# Patient Record
Sex: Female | Born: 1960 | Race: White | Hispanic: Yes | Marital: Married | Smoking: Never smoker
Health system: Southern US, Community
[De-identification: ages and names within clinical notes are randomized; demographics above are authoritative.]

## PROBLEM LIST (undated history)

## (undated) DIAGNOSIS — J309 Allergic rhinitis, unspecified: Secondary | ICD-10-CM

## (undated) DIAGNOSIS — R002 Palpitations: Secondary | ICD-10-CM

## (undated) DIAGNOSIS — M549 Dorsalgia, unspecified: Secondary | ICD-10-CM

## (undated) DIAGNOSIS — K5790 Diverticulosis of intestine, part unspecified, without perforation or abscess without bleeding: Secondary | ICD-10-CM

## (undated) DIAGNOSIS — E66811 Obesity, class 1: Secondary | ICD-10-CM

## (undated) DIAGNOSIS — K760 Fatty (change of) liver, not elsewhere classified: Secondary | ICD-10-CM

## (undated) DIAGNOSIS — G4700 Insomnia, unspecified: Secondary | ICD-10-CM

## (undated) DIAGNOSIS — I10 Essential (primary) hypertension: Secondary | ICD-10-CM

## (undated) DIAGNOSIS — M2550 Pain in unspecified joint: Secondary | ICD-10-CM

## (undated) DIAGNOSIS — Z1211 Encounter for screening for malignant neoplasm of colon: Secondary | ICD-10-CM

## (undated) DIAGNOSIS — M25551 Pain in right hip: Secondary | ICD-10-CM

## (undated) DIAGNOSIS — E7800 Pure hypercholesterolemia, unspecified: Secondary | ICD-10-CM

## (undated) DIAGNOSIS — Z87440 Personal history of urinary (tract) infections: Secondary | ICD-10-CM

## (undated) DIAGNOSIS — E669 Obesity, unspecified: Secondary | ICD-10-CM

## (undated) DIAGNOSIS — K5900 Constipation, unspecified: Secondary | ICD-10-CM

## (undated) DIAGNOSIS — K219 Gastro-esophageal reflux disease without esophagitis: Secondary | ICD-10-CM

## (undated) DIAGNOSIS — Z8719 Personal history of other diseases of the digestive system: Secondary | ICD-10-CM

## (undated) DIAGNOSIS — N12 Tubulo-interstitial nephritis, not specified as acute or chronic: Secondary | ICD-10-CM

## (undated) DIAGNOSIS — D649 Anemia, unspecified: Secondary | ICD-10-CM

## (undated) HISTORY — DX: Tubulo-interstitial nephritis, not specified as acute or chronic: N12

## (undated) HISTORY — DX: Obesity, unspecified: E66.9

## (undated) HISTORY — DX: Personal history of urinary (tract) infections: Z87.440

## (undated) HISTORY — DX: Insomnia, unspecified: G47.00

## (undated) HISTORY — DX: Obesity, class 1: E66.811

## (undated) HISTORY — DX: Dorsalgia, unspecified: M54.9

## (undated) HISTORY — DX: Fatty (change of) liver, not elsewhere classified: K76.0

## (undated) HISTORY — PX: COLONOSCOPY: SHX174

## (undated) HISTORY — DX: Pain in unspecified joint: M25.50

## (undated) HISTORY — DX: Diverticulosis of intestine, part unspecified, without perforation or abscess without bleeding: K57.90

## (undated) HISTORY — PX: ESOPHAGOGASTRODUODENOSCOPY: SHX1529

## (undated) HISTORY — DX: Allergic rhinitis, unspecified: J30.9

## (undated) HISTORY — DX: Gastro-esophageal reflux disease without esophagitis: K21.9

## (undated) HISTORY — DX: Pure hypercholesterolemia, unspecified: E78.00

## (undated) HISTORY — DX: Personal history of other diseases of the digestive system: Z87.19

## (undated) HISTORY — DX: Pain in right hip: M25.551

## (undated) HISTORY — DX: Constipation, unspecified: K59.00

## (undated) HISTORY — DX: Anemia, unspecified: D64.9

## (undated) HISTORY — DX: Encounter for screening for malignant neoplasm of colon: Z12.11

## (undated) HISTORY — DX: Palpitations: R00.2

## (undated) HISTORY — DX: Essential (primary) hypertension: I10

---

## 2003-10-09 HISTORY — PX: ABDOMINAL HYSTERECTOMY: SHX81

## 2003-10-09 HISTORY — PX: BREAST BIOPSY: SHX20

## 2007-10-09 HISTORY — PX: TONSILLECTOMY AND ADENOIDECTOMY: SUR1326

## 2009-10-08 HISTORY — PX: CHOLECYSTECTOMY: SHX55

## 2015-10-09 DIAGNOSIS — R7303 Prediabetes: Secondary | ICD-10-CM

## 2015-10-09 HISTORY — DX: Prediabetes: R73.03

## 2016-01-06 LAB — LIPID PANEL
Cholesterol: 155 (ref 0–200)
HDL: 65 (ref 35–70)
LDL Cholesterol: 77
Triglycerides: 65 (ref 40–160)

## 2016-01-06 LAB — BASIC METABOLIC PANEL
BUN: 15 (ref 4–21)
Creatinine: 1 (ref 0.5–1.1)
Glucose: 109
Potassium: 4.6 (ref 3.4–5.3)
Sodium: 143 (ref 137–147)

## 2016-01-06 LAB — HEMOGLOBIN A1C: Hemoglobin A1C: 6

## 2016-01-06 LAB — HEPATIC FUNCTION PANEL
ALT: 19 (ref 7–35)
AST: 21 (ref 13–35)
Alkaline Phosphatase: 120 (ref 25–125)
Bilirubin, Total: 0.6

## 2016-05-03 LAB — HEPATIC FUNCTION PANEL
ALT: 22 (ref 7–35)
AST: 19 (ref 13–35)
Alkaline Phosphatase: 114 (ref 25–125)
Bilirubin, Total: 0.3

## 2016-05-03 LAB — BASIC METABOLIC PANEL
BUN: 10 (ref 4–21)
Creatinine: 0.8 (ref 0.5–1.1)
Glucose: 107
Potassium: 4.6 (ref 3.4–5.3)
Sodium: 143 (ref 137–147)

## 2016-05-03 LAB — LIPID PANEL
Cholesterol: 167 (ref 0–200)
HDL: 58 (ref 35–70)
LDL Cholesterol: 92
Triglycerides: 84 (ref 40–160)

## 2016-05-03 LAB — HEMOGLOBIN A1C: Hemoglobin A1C: 6.1

## 2016-05-03 LAB — TSH: TSH: 2.07 (ref 0.41–5.90)

## 2016-11-28 LAB — LIPID PANEL
Cholesterol: 221 — AB (ref 0–200)
HDL: 52 (ref 35–70)
LDL Cholesterol: 146
Triglycerides: 113 (ref 40–160)

## 2016-11-28 LAB — HEPATIC FUNCTION PANEL
ALT: 21 (ref 7–35)
AST: 20 (ref 13–35)
Alkaline Phosphatase: 89 (ref 25–125)
Bilirubin, Total: 0.2

## 2016-11-28 LAB — TSH: TSH: 2.49 (ref 0.41–5.90)

## 2016-11-28 LAB — BASIC METABOLIC PANEL
BUN: 14 (ref 4–21)
Creatinine: 1 (ref 0.5–1.1)
Glucose: 106
Potassium: 4.6 (ref 3.4–5.3)
Sodium: 145 (ref 137–147)

## 2016-11-28 LAB — CBC AND DIFFERENTIAL
Platelets: 351 (ref 150–399)
WBC: 9.7

## 2016-11-28 LAB — HEMOGLOBIN A1C: Hemoglobin A1C: 5.8

## 2017-07-08 LAB — HEPATIC FUNCTION PANEL
ALT: 24 (ref 7–35)
AST: 19 (ref 13–35)
Alkaline Phosphatase: 106 (ref 25–125)
Bilirubin, Total: 0.3

## 2017-07-08 LAB — BASIC METABOLIC PANEL
BUN: 9 (ref 4–21)
Creatinine: 0.9 (ref 0.5–1.1)
Glucose: 101
Potassium: 4.7 (ref 3.4–5.3)
Sodium: 145 (ref 137–147)

## 2017-07-08 LAB — HEMOGLOBIN A1C: Hemoglobin A1C: 6.1

## 2017-07-08 LAB — LIPID PANEL
Cholesterol: 205 — AB (ref 0–200)
HDL: 47 (ref 35–70)
LDL Cholesterol: 134
Triglycerides: 121 (ref 40–160)

## 2018-01-03 LAB — LIPID PANEL
Cholesterol: 223 — AB (ref 0–200)
HDL: 65 (ref 35–70)
LDL Cholesterol: 141
Triglycerides: 86 (ref 40–160)

## 2018-01-03 LAB — BASIC METABOLIC PANEL
BUN: 17 (ref 4–21)
Creatinine: 0.8 (ref 0.5–1.1)
Glucose: 91
Potassium: 5.1 (ref 3.4–5.3)
Sodium: 145 (ref 137–147)

## 2018-01-03 LAB — HEPATIC FUNCTION PANEL
ALT: 20 (ref 7–35)
AST: 19 (ref 13–35)
Alkaline Phosphatase: 120 (ref 25–125)
Bilirubin, Total: 0.4

## 2018-01-03 LAB — HEMOGLOBIN A1C: Hemoglobin A1C: 6.1

## 2018-08-11 LAB — BASIC METABOLIC PANEL
BUN: 10 (ref 4–21)
Potassium: 4.8 (ref 3.4–5.3)
Sodium: 148 — AB (ref 137–147)

## 2018-08-11 LAB — HEPATIC FUNCTION PANEL
ALT: 22 (ref 7–35)
AST: 20 (ref 13–35)
Alkaline Phosphatase: 111 (ref 25–125)
Bilirubin, Total: 0.2

## 2018-08-11 LAB — HEMOGLOBIN A1C: Hemoglobin A1C: 5.1

## 2018-08-11 LAB — LIPID PANEL
Cholesterol: 197 (ref 0–200)
HDL: 55 (ref 35–70)
LDL Cholesterol: 121
Triglycerides: 105 (ref 40–160)

## 2018-08-11 LAB — TSH: TSH: 0.64 (ref 0.41–5.90)

## 2018-12-25 ENCOUNTER — Ambulatory Visit: Payer: Self-pay | Admitting: Family Medicine

## 2018-12-29 ENCOUNTER — Ambulatory Visit: Payer: Self-pay | Admitting: Family Medicine

## 2019-01-29 ENCOUNTER — Telehealth: Payer: Self-pay

## 2019-01-29 ENCOUNTER — Encounter: Payer: Self-pay | Admitting: Family Medicine

## 2019-01-29 ENCOUNTER — Other Ambulatory Visit: Payer: Self-pay

## 2019-01-29 ENCOUNTER — Ambulatory Visit (INDEPENDENT_AMBULATORY_CARE_PROVIDER_SITE_OTHER): Payer: 59 | Admitting: Family Medicine

## 2019-01-29 VITALS — Temp 98.5°F | Wt 159.5 lb

## 2019-01-29 DIAGNOSIS — E7800 Pure hypercholesterolemia, unspecified: Secondary | ICD-10-CM

## 2019-01-29 DIAGNOSIS — I10 Essential (primary) hypertension: Secondary | ICD-10-CM | POA: Diagnosis not present

## 2019-01-29 DIAGNOSIS — K219 Gastro-esophageal reflux disease without esophagitis: Secondary | ICD-10-CM

## 2019-01-29 MED ORDER — PANTOPRAZOLE SODIUM 40 MG PO TBEC: 40.0000 mg | DELAYED_RELEASE_TABLET | Freq: Every day | ORAL | 1 refills | Status: DC

## 2019-01-29 NOTE — Telephone Encounter (Signed)
Medical release form faxed again

## 2019-01-29 NOTE — Telephone Encounter (Signed)
Form sent again to Dr.Ford for pt's medical records.  Copied from CRM [id number]. Topic: Medical Record Request - Patient ROI Request >> Jan 29, 2019  2:24 PM Elliot Gault wrote: Relation to pt: self  Call back number: [phone]  Reason for call:  Patient states # indicated on medical release was incorrect, please re fax form to Dr. Ala Dach from [facility] [phone]

## 2019-01-29 NOTE — Progress Notes (Signed)
Virtual Visit via Video Note  I connected with pt  on 01/29/19 at  1:00 PM EDT by a video enabled telemedicine application and verified that I am speaking with the correct person using two identifiers.  Location patient: home Location provider:work or home office Persons participating in the virtual visit: patient, provider  I discussed the limitations of evaluation and management by telemedicine and the availability of in person appointments. The patient expressed understanding and agreed to proceed.  Telemedicine visit is a necessity given the COVID-19 restrictions in place at the current time.       Office Note 01/29/2019  CC:  Chief Complaint  Patient presents with  . Establish Care    Previous PCP, Dr.Ford in [place]   HPI:  Martha [name] is a 58 y.o. female who is being seen today to establish care Patient's most recent primary MD: see above. Old records were not available for review prior to or during today's visit.  Most recent f/u with her PCP in [place] was approx Nov/Dec 2019, at which time she got labs. Says no changes in management at that time.  HTN: she does not monitor her bp outside of medical office. She does plan on getting a bp cuff for home.  GERD: says this is become a problem for her more and more. Used to be an intermittent thing, now has daily sx's for at least a few months. Usually around bedtime she has substernal burning, mild throat discomfort with the feeling of need to clear throat a lot or cough up secretions in throat a lot.  Wakes up in morning with bad taste of refluxed liquid in mouth. Does not eat GERD-friendly diet, nor does she elevate the head of her bed. She has not tried any otc or ever been rx'd any meds for GERD in the past. She reports having had an EGD in the past in the context of abd pain w/u and this just showed some GER changes and was o/w normal. No epigastric pain, no nausea, no undesired wt loss, no use of NSAIDs with any  regularity. No abd pains.  Past Medical History:  Diagnosis Date  . Allergic rhinitis   . Arthralgia of multiple sites    Endo w/u normal per pt.  "treat symptomatically as needed"  . GERD (gastroesophageal reflux disease)   . History of rectal bleeding    remote past->? rectal ulcer vs perf'd divertic-->need old records for clarif.  [name] Kitchen. Hypercholesterolemia    Past hx of statin use, then was able to come off meds when she lost wt  . Hypertension   . Right hip pain    MRI showed labral tear, got steroid injection    Past Surgical History:  Procedure Laterality Date  . ABDOMINAL HYSTERECTOMY  2005   for benign dx (No BSO)  . BREAST BIOPSY  2005  . CESAREAN SECTION     x 2  . CHOLECYSTECTOMY  2011  . COLONOSCOPY     x 2->approx 2011 was done for BRBPR, unclear dx (rectal ulcer? perf'd diverticulum?).  Another about 2015 for abd pain w/u?  No polyps detected on either colonoscopy.  Need old records for clarification (GI MD was Dr[place]  . TONSILLECTOMY AND ADENOIDECTOMY  2009    Family History  Problem Relation Age of Onset  . Diabetes Mother   . Alcohol abuse Mother   . Rheumatic fever Father   . Heart attack Father   . Diabetes Brother  Social History   Socioeconomic History  . Marital status: Married    Spouse name: Not on file  . Number of children: Not on file  . Years of education: Not on file  . Highest education level: Not on file  Occupational History  . Not on file  Social Needs  . Financial resource strain: Not on file  . Food insecurity:    Worry: Not on file    Inability: Not on file  . Transportation needs:    Medical: Not on file    Non-medical: Not on file  Tobacco Use  . Smoking status: Never Smoker  . Smokeless tobacco: Never Used  Substance and Sexual Activity  . Alcohol use: Never    Frequency: Never  . Drug use: Never  . Sexual activity: Not on file  Lifestyle  . Physical activity:    Days per week: Not on file     Minutes per session: Not on file  . Stress: Not on file  Relationships  . Social connections:    Talks on phone: Not on file    Gets together: Not on file    Attends religious service: Not on file    Active member of club or organization: Not on file    Attends meetings of clubs or organizations: Not on file    Relationship status: Not on file  . Intimate partner violence:    Fear of current or ex partner: Not on file    Emotionally abused: Not on file    Physically abused: Not on file    Forced sexual activity: Not on file  Other Topics Concern  . Not on file  Social History Narrative   Married, 2 daughters.   Moved to [facility] from [facility] 2019 to be closer to daughter and grandchildren.   Educ: College in [place].     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   In [place], she works from home for [employer].    Outpatient Encounter Medications as of 01/29/2019  Medication Sig  . Ascorbic Acid (VITAMIN C) 1000 MG tablet Vitamin C 1,000 mg tablet   1 tablet every day by oral route.  . benzonatate (TESSALON) 100 MG capsule benzonatate 100 mg capsule  . cholecalciferol (VITAMIN D) 25 MCG (1000 UT) tablet Vitamin D3 25 mcg (1,000 unit) capsule   1 capsule every day by oral route.  [name] Kitchen levocetirizine (XYZAL) 5 MG tablet levocetirizine 5 mg tablet  TAKE 1 TABLET BY MOUTH EVERY DAY  . lisinopril (ZESTRIL) 5 MG tablet lisinopril 5 mg tablet  . montelukast (SINGULAIR) 10 MG tablet   . Omega-3 Fatty Acids (OMEGA-3 FISH OIL PO) Omega 3 Fish Oil capsule   1 capsule every day by oral route.  . Thyroid (NATURE-THROID) 48.75 MG TABS Nature-Throid 48.75 mg tablet  TAKE ONE TABLET BY MOUTH EVERY MORNING  . pantoprazole (PROTONIX) 40 MG tablet Take 1 tablet (40 mg total) by mouth daily.   No facility-administered encounter medications on file as of 01/29/2019.     Allergies  Allergen Reactions  . Codeine Anaphylaxis    ROS Review of Systems  Constitutional: Negative for  appetite change, chills, fatigue and fever.  HENT: Negative for congestion, dental problem, ear pain and sore throat.   Eyes: Negative for discharge, redness and visual disturbance.  Respiratory: Negative for cough, chest tightness, shortness of breath and wheezing.   Cardiovascular: Negative for chest pain, palpitations and leg swelling.  Gastrointestinal: Negative for abdominal  pain, blood in stool, diarrhea, nausea and vomiting.  Genitourinary: Negative for difficulty urinating, dysuria, flank pain, frequency, hematuria and urgency.  Musculoskeletal: Negative for arthralgias, back pain, joint swelling, myalgias and neck stiffness.  Skin: Negative for pallor and rash.  Neurological: Negative for dizziness, speech difficulty, weakness and headaches.  Hematological: Negative for adenopathy. Does not bruise/bleed easily.  Psychiatric/Behavioral: Negative for confusion and sleep disturbance. The patient is not nervous/anxious.     PE; Temperature 98.5 F (36.9 C), temperature source Oral, weight 159 lb 8 oz (72.3 kg).  GENERAL: alert, oriented, appears well and in no acute distress  HEENT: atraumatic, conjunttiva clear, no obvious abnormalities on inspection of external nose and ears  NECK: normal movements of the head and neck  LUNGS: on inspection no signs of respiratory distress, breathing rate appears normal, no obvious gross SOB, gasping or wheezing  CV: no obvious cyanosis  MS: moves all visible extremities without noticeable abnormality  PSYCH/NEURO: pleasant and cooperative, no obvious depression or anxiety, speech and thought processing grossly intact  Pertinent labs:  NONE TODAY   ASSESSMENT AND PLAN:   1) GERD: daily sx's and affecting QOL significantly. Start pantoprazole 40 mg qd.  Therapeutic expectations and side effect profile of medication discussed today.  Patient's questions answered. GERD diet handout will be mailed to pt. Elevate head of bed with brick or 2  x 4.  2) HTN: well controlled historically per pt report. She will be getting bp cuff to monitor bp at home.  3) HLD: hx of statin use but was able to stop this med when she lost a significant amount of wt. Will review past records.  Will review most recent lab work from her last visit with her PCP. Request prior PCP records again today.  I discussed the assessment and treatment plan with the patient. The patient was provided an opportunity to ask questions and all were answered. The patient agreed with the plan and demonstrated an understanding of the instructions.   The patient was advised to call back or seek an in-person evaluation if the symptoms worsen or if the condition fails to improve as anticipated.  F/u: 1 mo f/u GERD.  Signed:  Santiago Bumpers, MD           01/29/2019

## 2019-01-29 NOTE — Telephone Encounter (Signed)
Copied from CRM [id number]. Topic: Medical Record Request - Patient ROI Request >> Jan 29, 2019  2:24 PM Elliot Gault wrote: Relation to pt: self  Call back number: [phone]  Reason for call:  Patient states # indicated on medical release was incorrect, please re fax form to Dr. Ala Dach from [facility] [phone]

## 2019-01-29 NOTE — Patient Instructions (Signed)
Food Choices for Gastroesophageal Reflux Disease, Adult  When you have gastroesophageal reflux disease (GERD), the foods you eat and your eating habits are very important. Choosing the right foods can help ease the discomfort of GERD. Consider working with a diet and nutrition specialist (dietitian) to help you make healthy food choices.  What general guidelines should I follow?    Eating plan  · Choose healthy foods low in fat, such as fruits, vegetables, whole grains, low-fat dairy products, and lean meat, fish, and poultry.  · Eat frequent, small meals instead of three large meals each day. Eat your meals slowly, in a relaxed setting. Avoid bending over or lying down until 2-3 hours after eating.  · Limit high-fat foods such as fatty meats or fried foods.  · Limit your intake of oils, butter, and shortening to less than 8 teaspoons each day.  · Avoid the following:  ? Foods that cause symptoms. These may be different for different people. Keep a food diary to keep track of foods that cause symptoms.  ? Alcohol.  ? Drinking large amounts of liquid with meals.  ? Eating meals during the 2-3 hours before bed.  · Cook foods using methods other than frying. This may include baking, grilling, or broiling.  Lifestyle  · Maintain a healthy weight. Ask your health care provider what weight is healthy for you. If you need to lose weight, work with your health care provider to do so safely.  · Exercise for at least 30 minutes on 5 or more days each week, or as told by your health care provider.  · Avoid wearing clothes that fit tightly around your waist and chest.  · Do not use any products that contain nicotine or tobacco, such as cigarettes and e-cigarettes. If you need help quitting, ask your health care provider.  · Sleep with the head of your bed raised. Use a wedge under the mattress or blocks under the bed frame to raise the head of the bed.  What foods are not recommended?  The items listed may not be a complete  list. Talk with your dietitian about what dietary choices are best for you.  Grains  Pastries or quick breads with added fat. French toast.  Vegetables  Deep fried vegetables. French fries. Any vegetables prepared with added fat. Any vegetables that cause symptoms. For some people this may include tomatoes and tomato products, chili peppers, onions and garlic, and horseradish.  Fruits  Any fruits prepared with added fat. Any fruits that cause symptoms. For some people this may include citrus fruits, such as oranges, grapefruit, pineapple, and lemons.  Meats and other protein foods  High-fat meats, such as fatty beef or pork, hot dogs, ribs, ham, sausage, salami and bacon. Fried meat or protein, including fried fish and fried chicken. Nuts and nut butters.  Dairy  Whole milk and chocolate milk. Sour cream. Cream. Ice cream. Cream cheese. Milk shakes.  Beverages  Coffee and tea, with or without caffeine. Carbonated beverages. Sodas. Energy drinks. Fruit juice made with acidic fruits (such as orange or grapefruit). Tomato juice. Alcoholic drinks.  Fats and oils  Butter. Margarine. Shortening. Ghee.  Sweets and desserts  Chocolate and cocoa. Donuts.  Seasoning and other foods  Pepper. Peppermint and spearmint. Any condiments, herbs, or seasonings that cause symptoms. For some people, this may include curry, hot sauce, or vinegar-based salad dressings.  Summary  · When you have gastroesophageal reflux disease (GERD), food and lifestyle choices are very   important to help ease the discomfort of GERD.  · Eat frequent, small meals instead of three large meals each day. Eat your meals slowly, in a relaxed setting. Avoid bending over or lying down until 2-3 hours after eating.  · Limit high-fat foods such as fatty meat or fried foods.  This information is not intended to replace advice given to you by your health care provider. Make sure you discuss any questions you have with your health care provider.  Document Released:  09/24/2005 Document Revised: 09/25/2016 Document Reviewed: 09/25/2016  Elsevier Interactive Patient Education © 2019 Elsevier Inc.

## 2019-01-29 NOTE — Telephone Encounter (Signed)
New medical release form faxed to # listed below.  Copied from CRM [id number]. Topic: Medical Record Request - Patient ROI Request >> Jan 29, 2019  2:24 PM Elliot Gault wrote: Relation to pt: self  Call back number: [phone]  Reason for call:  Patient states # indicated on medical release was incorrect, please re fax form to Dr. Ala Dach from [facility] [phone]

## 2019-01-30 ENCOUNTER — Telehealth: Payer: Self-pay

## 2019-01-30 NOTE — Telephone Encounter (Signed)
PW placed up front to be mailed.

## 2019-02-09 ENCOUNTER — Encounter: Payer: Self-pay | Admitting: Family Medicine

## 2019-02-09 NOTE — Progress Notes (Signed)
11/28/2016

## 2019-02-20 ENCOUNTER — Other Ambulatory Visit: Payer: Self-pay | Admitting: Family Medicine

## 2019-02-20 NOTE — Telephone Encounter (Signed)
Pt has upcoming appt on 5/18 for f/u GERD and was given (30,1) on 01/29/19. Will hold off until then

## 2019-02-23 ENCOUNTER — Ambulatory Visit (INDEPENDENT_AMBULATORY_CARE_PROVIDER_SITE_OTHER): Payer: 59 | Admitting: Family Medicine

## 2019-02-23 ENCOUNTER — Encounter: Payer: Self-pay | Admitting: Family Medicine

## 2019-02-23 VITALS — BP 120/78 | Temp 98.1°F | Wt 161.0 lb

## 2019-02-23 DIAGNOSIS — R7303 Prediabetes: Secondary | ICD-10-CM

## 2019-02-23 DIAGNOSIS — K219 Gastro-esophageal reflux disease without esophagitis: Secondary | ICD-10-CM | POA: Diagnosis not present

## 2019-02-23 MED ORDER — FAMOTIDINE 40 MG PO TABS: 40.0000 mg | ORAL_TABLET | Freq: Every day | ORAL | 1 refills | Status: DC

## 2019-02-23 NOTE — Progress Notes (Signed)
Virtual Visit via Video Note  I connected with pt on 02/23/19 at  3:00 PM EDT by a video enabled telemedicine application and verified that I am speaking with the correct person using two identifiers.  Location patient: home Location provider:work or home office Persons participating in the virtual visit: patient, provider  I discussed the limitations of evaluation and management by telemedicine and the availability of in person appointments. The patient expressed understanding and agreed to proceed.   HPI: 58 y/o WF being seen today for 3 week f/u for GERD. Started her on pantoprazole 40mg  qd last visit.  Interim hx:  Improved but still waking up in middle of night with sour brash in mouth.  No longer with sense of regurgitation.  No dysphagia.  Appetite is good. Dietary modifications--->not eating later than 6:30 pm.   She takes the pantoprazole qAM.  Since last visit I have received her prior PCP records and review of these showed hx of prediabetes (see pMH section below).  She was put on metformin once a day by a wt loss MD, approx 6-12 mo, says her PCP then took her off the med.  She recalls tolerating the med w/out any problem.  ROS: no chest pain, no cough, no ST, no SOB, no wheezing, no hoarseness, no fevers, no abd pain.  Past Medical History:  Diagnosis Date  . Allergic rhinitis   . Arthralgia of multiple sites    Gen rheum lab w/u normal/neg 11/2016 by prior PCP  . GERD (gastroesophageal reflux disease)   . History of rectal bleeding    remote past->? rectal ulcer vs perf'd divertic-->need old records for clarif.  [name] Kitchen. Hypercholesterolemia    Past hx of statin use, then was able to come off meds when she lost wt  . Hypertension   . Insomnia   . Prediabetes 2017   Old PCP records state only that her Hba1c was 6.1% in 2017 and that she took metformin for " a while". A1c 6.1% Nov 2019.  . Right hip pain    MRI showed labral tear, got steroid injection    Past Surgical  History:  Procedure Laterality Date  . ABDOMINAL HYSTERECTOMY  2005   for benign dx (No BSO)  . BREAST BIOPSY  2005  . CESAREAN SECTION     x 2  . CHOLECYSTECTOMY  2011  . COLONOSCOPY  most recent 01/2012   x 2->approx 2011 was done for BRBPR, unclear dx (rectal ulcer? perf'd diverticulum?).  Another about 2015 for abd pain w/u?  No polyps detected on either colonoscopy.  Need old records for clarification (GI MD was Dr[place] The[place]ore [place] in [place]prior pcp records say "colonoscopy 01/2012 negative".  . ESOPHAGOGASTRODUODENOSCOPY     done as part of abd pain w/u: showed GER but o/w was normal.  . TONSILLECTOMY AND ADENOIDECTOMY  2009    Family History  Problem Relation Age of Onset  . Diabetes Mother   . Alcohol abuse Mother   . Rheumatic fever Father   . Heart attack Father   . Diabetes Brother     SOCIAL HX: Married, 2 daughters, works for [employer] out of her home.   Current Outpatient Medications:  .  Ascorbic Acid (VITAMIN C) 1000 MG tablet, Vitamin C 1,000 mg tablet   1 tablet every day by oral route., Disp: , Rfl:  .  cholecalciferol (VITAMIN D) 25 MCG (1000 UT) tablet, Vitamin D3 25 mcg (1,000 unit) capsule   1 capsule every day by oral  route., Disp: , Rfl:  .  levocetirizine (XYZAL) 5 MG tablet, levocetirizine 5 mg tablet  TAKE 1 TABLET BY MOUTH EVERY DAY, Disp: , Rfl:  .  lisinopril (ZESTRIL) 5 MG tablet, lisinopril 5 mg tablet, Disp: , Rfl:  .  meloxicam (MOBIC) 15 MG tablet, Take 15 mg by mouth daily as needed for pain., Disp: , Rfl:  .  montelukast (SINGULAIR) 10 MG tablet, , Disp: , Rfl:  .  Omega-3 Fatty Acids (OMEGA-3 FISH OIL PO), Omega 3 Fish Oil capsule   1 capsule every day by oral route., Disp: , Rfl:  .  pantoprazole (PROTONIX) 40 MG tablet, Take 1 tablet (40 mg total) by mouth daily., Disp: 30 tablet, Rfl: 1 .  Thyroid (NATURE-THROID) 48.75 MG TABS, Nature-Throid 48.75 mg tablet  TAKE ONE TABLET BY MOUTH EVERY MORNING, Disp: , Rfl:  .  benzonatate (TESSALON) 100 MG  capsule, benzonatate 100 mg capsule, Disp: , Rfl:   EXAM:  VITALS per patient if applicable: BP 120/78 (BP Location: Left Arm, Patient Position: Sitting, Cuff Size: Normal)   Temp 98.1 F (36.7 C) (Oral)   Wt 161 lb (73 kg)    GENERAL: alert, oriented, appears well and in no acute distress  HEENT: atraumatic, conjunttiva clear, no obvious abnormalities on inspection of external nose and ears  NECK: normal movements of the head and neck  LUNGS: on inspection no signs of respiratory distress, breathing rate appears normal, no obvious gross SOB, gasping or wheezing  CV: no obvious cyanosis  MS: moves all visible extremities without noticeable abnormality  PSYCH/NEURO: pleasant and cooperative, no obvious depression or anxiety, speech and thought processing grossly intact  LABS: none today    Chemistry      Component Value Date/Time   NA 148 (A) 08/11/2018   K 4.8 08/11/2018   BUN 10 08/11/2018   CREATININE 0.8 01/03/2018   GLU 91 01/03/2018      Component Value Date/Time   ALKPHOS 111 08/11/2018   AST 20 08/11/2018   ALT 22 08/11/2018     Lab Results  Component Value Date   TSH 0.64 08/11/2018   Lab Results  Component Value Date   CHOL 197 08/11/2018   HDL 55 08/11/2018   LDLCALC 121 08/11/2018   TRIG 105 08/11/2018   Lab Results  Component Value Date   WBC 9.7 11/28/2016   PLT 351 11/28/2016   Lab Results  Component Value Date   HGBA1C 5.1 08/11/2018    ASSESSMENT AND PLAN:  Discussed the following assessment and plan:   1) GERD: improved but still some breakthrough sx's hs/middle of night. Continue pantoprazole 40mg  qAM, adjust diet, and add pepcid 40mg  qhs.  2) Prediabetes: we'll hold off from restarting metformin today since her last 2 A1c's have been stable. If A1c rises any at next CPE/labs in 6 mo then will restart this med.   I discussed the assessment and treatment plan with the patient. The patient was provided an opportunity to ask  questions and all were answered. The patient agreed with the plan and demonstrated an understanding of the instructions.   The patient was advised to call back or seek an in-person evaluation if the symptoms worsen or if the condition fails to improve as anticipated.  F/u: 6 wks f/u GERD. CPE November 2020  Signed:  Santiago Bumpers, MD           02/23/2019

## 2019-04-01 ENCOUNTER — Other Ambulatory Visit: Payer: Self-pay

## 2019-04-01 ENCOUNTER — Ambulatory Visit (INDEPENDENT_AMBULATORY_CARE_PROVIDER_SITE_OTHER): Payer: 59 | Admitting: Family Medicine

## 2019-04-01 ENCOUNTER — Encounter: Payer: Self-pay | Admitting: Family Medicine

## 2019-04-01 VITALS — BP 120/85 | Temp 98.6°F | Wt 161.0 lb

## 2019-04-01 DIAGNOSIS — J309 Allergic rhinitis, unspecified: Secondary | ICD-10-CM | POA: Diagnosis not present

## 2019-04-01 MED ORDER — FLUTICASONE PROPIONATE 50 MCG/ACT NA SUSP: 2.0000 | Freq: Every day | NASAL | 6 refills | Status: DC

## 2019-04-01 MED ORDER — PREDNISONE 5 MG PO TABS: ORAL_TABLET | ORAL | 0 refills | Status: DC

## 2019-04-01 NOTE — Progress Notes (Signed)
Virtual Visit via Video Note  I connected with pt on 04/01/19 at  4:00 PM EDT by a video enabled telemedicine application and verified that I am speaking with the correct person using two identifiers.  Location patient: home Location provider:work or home office Persons participating in the virtual visit: patient, provider  I discussed the limitations of evaluation and management by telemedicine and the availability of in person appointments. The patient expressed understanding and agreed to proceed.  Telemedicine visit is a necessity given the COVID-19 restrictions in place at the current time.  HPI: 58 y/o female being seen today for "allergies/sinus". Onset of PND and sinus pressure yesterday, hoarse voice today, some greenish mucous coming from nose. Occ nonproductive PND cough, no fever.  Some dry and mildly ST. Her allergies are usually worse this time of year.  ROS: See pertinent positives and negatives per HPI.  Past Medical History:  Diagnosis Date  . Allergic rhinitis   . Arthralgia of multiple sites    Gen rheum lab w/u normal/neg 11/2016 by prior PCP  . GERD (gastroesophageal reflux disease)   . History of rectal bleeding    remote past->? rectal ulcer vs perf'd divertic-->need old records for clarif.  [name] Kitchen. Hypercholesterolemia    Past hx of statin use, then was able to come off meds when she lost wt  . Hypertension   . Insomnia   . Prediabetes 2017   Old PCP records state only that her Hba1c was 6.1% in 2017: she took metformin x ? 6-12 mo, rx'd by wt loss MD.  A1c 6.1% Nov 2019.  . Right hip pain    MRI showed labral tear, got steroid injection    Past Surgical History:  Procedure Laterality Date  . ABDOMINAL HYSTERECTOMY  2005   for benign dx (No BSO)  . BREAST BIOPSY  2005  . CESAREAN SECTION     x 2  . CHOLECYSTECTOMY  2011  . COLONOSCOPY  most recent 01/2012   x 2->approx 2011 was done for BRBPR, unclear dx (rectal ulcer? perf'd diverticulum?).  Another  about 2015 for abd pain w/u?  No polyps detected on either colonoscopy.  Need old records for clarification (GI MD was Dr. Thedore [place] in [place])--prior pcp records say "colonoscopy 01/2012 negative".  . ESOPHAGOGASTRODUODENOSCOPY     done as part of abd pain w/u: showed GER but o/w was normal.  . TONSILLECTOMY AND ADENOIDECTOMY  2009    Family History  Problem Relation Age of Onset  . Diabetes Mother   . Alcohol abuse Mother   . Rheumatic fever Father   . Heart attack Father   . Diabetes Brother     SOCIAL HX:  Social History   Socioeconomic History  . Marital status: Married    Spouse name: Not on file  . Number of children: Not on file  . Years of education: Not on file  . Highest education level: Not on file  Occupational History  . Not on file  Social Needs  . Financial resource strain: Not on file  . Food insecurity    Worry: Not on file    Inability: Not on file  . Transportation needs    Medical: Not on file    Non-medical: Not on file  Tobacco Use  . Smoking status: Never Smoker  . Smokeless tobacco: Never Used  Substance and Sexual Activity  . Alcohol use: Never    Frequency: Never  . Drug use: Never  . Sexual activity: Not on  file  Lifestyle  . Physical activity    Days per week: Not on file    Minutes per session: Not on file  . Stress: Not on file  Relationships  . Social Herbalist on phone: Not on file    Gets together: Not on file    Attends religious service: Not on file    Active member of club or organization: Not on file    Attends meetings of clubs or organizations: Not on file    Relationship status: Not on file  Other Topics Concern  . Not on file  Social History Narrative   Married, 2 daughters.   Moved to [facility] from [facility] 2019 to be closer to daughter and grandchildren.   Educ: College in [place].     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   In [place], she works from home for [employer].      Current  Outpatient Medications:  .  Ascorbic Acid (VITAMIN C) 1000 MG tablet, Vitamin C 1,000 mg tablet   1 tablet every day by oral route., Disp: , Rfl:  .  cholecalciferol (VITAMIN D) 25 MCG (1000 UT) tablet, Vitamin D3 25 mcg (1,000 unit) capsule   1 capsule every day by oral route., Disp: , Rfl:  .  famotidine (PEPCID) 40 MG tablet, Take 1 tablet (40 mg total) by mouth at bedtime., Disp: 30 tablet, Rfl: 1 .  levocetirizine (XYZAL) 5 MG tablet, levocetirizine 5 mg tablet  TAKE 1 TABLET BY MOUTH EVERY DAY, Disp: , Rfl:  .  lisinopril (ZESTRIL) 5 MG tablet, lisinopril 5 mg tablet, Disp: , Rfl:  .  meloxicam (MOBIC) 15 MG tablet, Take 15 mg by mouth daily as needed for pain., Disp: , Rfl:  .  montelukast (SINGULAIR) 10 MG tablet, , Disp: , Rfl:  .  Omega-3 Fatty Acids (OMEGA-3 FISH OIL PO), Omega 3 Fish Oil capsule   1 capsule every day by oral route., Disp: , Rfl:  .  pantoprazole (PROTONIX) 40 MG tablet, TAKE 1 TABLET BY MOUTH EVERY DAY, Disp: 30 tablet, Rfl: 1 .  Thyroid (NATURE-THROID) 48.75 MG TABS, Nature-Throid 48.75 mg tablet  TAKE ONE TABLET BY MOUTH EVERY MORNING, Disp: , Rfl:  .  benzonatate (TESSALON) 100 MG capsule, benzonatate 100 mg capsule, Disp: , Rfl:  .  fluticasone (FLONASE) 50 MCG/ACT nasal spray, Place 2 sprays into both nostrils daily., Disp: 16 g, Rfl: 6 .  predniSONE (DELTASONE) 5 MG tablet, 4 tabs po qd x 2d, then 2 tabs po qd x 2d, then 1 tab po qd x 2d, Disp: 14 tablet, Rfl: 0  EXAM:  VITALS per patient if applicable:  GENERAL: alert, oriented, appears well and in no acute distress  HEENT: atraumatic, conjunttiva clear, no obvious abnormalities on inspection of external nose and ears  NECK: normal movements of the head and neck  LUNGS: on inspection no signs of respiratory distress, breathing rate appears normal, no obvious gross SOB, gasping or wheezing  CV: no obvious cyanosis  MS: moves all visible extremities without noticeable abnormality  PSYCH/NEURO: pleasant  and cooperative, no obvious depression or anxiety, speech and thought processing grossly intact  LABS: none today    Chemistry      Component Value Date/Time   NA 148 (A) 08/11/2018   K 4.8 08/11/2018   BUN 10 08/11/2018   CREATININE 0.8 01/03/2018   GLU 91 01/03/2018      Component Value Date/Time  ALKPHOS 111 08/11/2018   AST 20 08/11/2018   ALT 22 08/11/2018     Lab Results  Component Value Date   WBC 9.7 11/28/2016   PLT 351 11/28/2016   Lab Results  Component Value Date   TSH 0.64 08/11/2018    ASSESSMENT AND PLAN:  Discussed the following assessment and plan:  1) Allergic rhinitis.  Less likely viral URI.  VERY low suspicion of bacterial sinusitis. Plan: start daily flonase and take regularly until weather is cooler and her allergies naturally improve.   Prednisone 20mg  qd x 2d, then 10mg  qd x 2d, then 5mg  qd x 2d. Continue xyzal and singulair.  I discussed the assessment and treatment plan with the patient. The patient was provided an opportunity to ask questions and all were answered. The patient agreed with the plan and demonstrated an understanding of the instructions.   The patient was advised to call back or seek an in-person evaluation if the symptoms worsen or if the condition fails to improve as anticipated.  F/u: prn  Signed:  Santiago BumpersPhil Yatzari Jonsson, MD           04/01/2019

## 2019-04-06 ENCOUNTER — Ambulatory Visit (INDEPENDENT_AMBULATORY_CARE_PROVIDER_SITE_OTHER): Payer: 59 | Admitting: Family Medicine

## 2019-04-06 ENCOUNTER — Encounter: Payer: Self-pay | Admitting: Family Medicine

## 2019-04-06 ENCOUNTER — Other Ambulatory Visit: Payer: Self-pay

## 2019-04-06 VITALS — BP 128/79 | Temp 98.7°F | Wt 160.0 lb

## 2019-04-06 DIAGNOSIS — J309 Allergic rhinitis, unspecified: Secondary | ICD-10-CM

## 2019-04-06 DIAGNOSIS — K219 Gastro-esophageal reflux disease without esophagitis: Secondary | ICD-10-CM

## 2019-04-06 NOTE — Progress Notes (Signed)
OFFICE VISIT  04/06/2019   CC:  Chief Complaint  Patient presents with  . Follow-up    GERD   I connected with Martha [name] (09/20/1961) on 04/06/19 at 1:45pm by a video enabled telemedicine application and verified that I am speaking with the correct person using two identifiers.  Location patient: home Location provider:work or home office Persons participating in the virtual visit: patient, provider  I discussed the limitations of evaluation and management by telemedicine and the availability of in person appointments. The patient expressed understanding and agreed to proceed.  HPI:    Patient is a 58 y.o. Caucasian female who presents for 6 wk f/u GERD. Last visit she was improved signif on pantoprazole 40mg  qAM but I went ahead and added pepcid 40mg  qhs to this b/c still waking up in night ewith sour brash in mouth.  Additionally, I saw her 5 d/a for a "flare" of allergic rhinitis and rx'd a 6d prednisone taper and also flonase to take daily.  Continued her on xyzal and singulair qd.  Interim Hx: Says she is doing VERY WELL and has no GERD sx's at all now. Says her allergic rhintis/PND/raspy throat sx's persist but are slightly improved.  No cough or fevers or wheezing.  Past Medical History:  Diagnosis Date  . Allergic rhinitis   . Arthralgia of multiple sites    Gen rheum lab w/u normal/neg 11/2016 by prior PCP  . GERD (gastroesophageal reflux disease)   . History of rectal bleeding    remote past->? rectal ulcer vs perf'd divertic-->need old records for clarif.  [name] Kitchen Hypercholesterolemia    Past hx of statin use, then was able to come off meds when she lost wt  . Hypertension   . Insomnia   . Prediabetes 2017   Old PCP records state only that her Hba1c was 6.1% in 2017: she took metformin x ? 6-12 mo, rx'd by wt loss MD.  A1c 6.1% Nov 2019.  . Right hip pain    MRI showed labral tear, got steroid injection    Past Surgical History:  Procedure Laterality Date  .  ABDOMINAL HYSTERECTOMY  2005   for benign dx (No BSO)  . BREAST BIOPSY  2005  . CESAREAN SECTION     x 2  . CHOLECYSTECTOMY  2011  . COLONOSCOPY  most recent 01/2012   x 2->approx 2011 was done for BRBPR, unclear dx (rectal ulcer? perf'd diverticulum?).  Another about 2015 for abd pain w/u?  No polyps detected on either colonoscopy.  Need old records for clarification (GI [place] was Dr. Candiss [place] in [place]prior pcp records say "colonoscopy 01/2012 negative".  . ESOPHAGOGASTRODUODENOSCOPY     done as part of abd pain w/u: showed GER but o/w was normal.  . TONSILLECTOMY AND ADENOIDECTOMY  2009    Outpatient Medications Prior to Visit  Medication Sig Dispense Refill  . Ascorbic Acid (VITAMIN C) 1000 MG tablet Vitamin C 1,000 mg tablet   1 tablet every day by oral route.    . cholecalciferol (VITAMIN D) 25 MCG (1000 UT) tablet Vitamin D3 25 mcg (1,000 unit) capsule   1 capsule every day by oral route.    . famotidine (PEPCID) 40 MG tablet Take 1 tablet (40 mg total) by mouth at bedtime. 30 tablet 1  . fluticasone (FLONASE) 50 MCG/ACT nasal spray Place 2 sprays into both nostrils daily. 16 g 6  . levocetirizine (XYZAL) 5 MG tablet levocetirizine 5 mg tablet  TAKE 1 TABLET BY MOUTH  EVERY DAY    . lisinopril (ZESTRIL) 5 MG tablet lisinopril 5 mg tablet    . meloxicam (MOBIC) 15 MG tablet Take 15 mg by mouth daily as needed for pain.    . montelukast (SINGULAIR) 10 MG tablet     . Omega-3 Fatty Acids (OMEGA-3 FISH OIL PO) Omega 3 Fish Oil capsule   1 capsule every day by oral route.    . pantoprazole (PROTONIX) 40 MG tablet TAKE 1 TABLET BY MOUTH EVERY DAY 30 tablet 1  . predniSONE (DELTASONE) 5 MG tablet 4 tabs po qd x 2d, then 2 tabs po qd x 2d, then 1 tab po qd x 2d 14 tablet 0  . Thyroid (NATURE-THROID) 48.75 MG TABS Nature-Throid 48.75 mg tablet  TAKE ONE TABLET BY MOUTH EVERY MORNING    . benzonatate (TESSALON) 100 MG capsule benzonatate 100 mg capsule     No facility-administered medications  prior to visit.     Allergies  Allergen Reactions  . Codeine Anaphylaxis    ROS As per HPI  PE: Blood pressure 128/79, temperature 98.7 F (37.1 C), temperature source Oral, weight 160 lb (72.6 kg). Gen: Alert, well appearing.  Patient is oriented to person, place, time, and situation.  ENT: conjunctiva appear w/out redness or drainage.    NECK: normal movements of the head and neck  LUNGS: on inspection no signs of respiratory distress, breathing rate appears normal, no obvious gross SOB, gasping or wheezing  CV: no obvious cyanosis  MS: moves all visible extremities without noticeable abnormality  PSYCH/NEURO: pleasant and cooperative, no obvious depression or anxiety, speech and thought processing grossly intact    LABS:    Chemistry      Component Value Date/Time   NA 148 (A) 08/11/2018   K 4.8 08/11/2018   BUN 10 08/11/2018   CREATININE 0.8 01/03/2018   GLU 91 01/03/2018      Component Value Date/Time   ALKPHOS 111 08/11/2018   AST 20 08/11/2018   ALT 22 08/11/2018     Lab Results  Component Value Date   HGBA1C 5.1 08/11/2018   Lab Results  Component Value Date   WBC 9.7 11/28/2016   PLT 351 11/28/2016   Lab Results  Component Value Date   TSH 0.64 08/11/2018     IMPRESSION AND PLAN:  1) GERD: doing great. Continue pantoprazole 40 mg qAM and pepcid 40mg  qhs. Discussed various ways to ween down/off these meds if she desires to in the future.  2) Allergic rhinitis: improving some.  No changes today.  An After Visit Summary was printed and given to the patient.  FOLLOW UP: Return in about 6 months (around 10/06/2019) for annual CPE (fasting).  Signed:  Santiago BumpersPhil Shalini Mair, MD           04/06/2019

## 2019-04-17 ENCOUNTER — Other Ambulatory Visit: Payer: Self-pay | Admitting: Family Medicine

## 2019-04-21 ENCOUNTER — Other Ambulatory Visit: Payer: Self-pay | Admitting: Family Medicine

## 2019-05-11 ENCOUNTER — Other Ambulatory Visit: Payer: Self-pay | Admitting: Family Medicine

## 2019-05-14 ENCOUNTER — Other Ambulatory Visit: Payer: Self-pay | Admitting: Family Medicine

## 2019-06-09 HISTORY — PX: ESOPHAGOGASTRODUODENOSCOPY: SHX1529

## 2019-06-17 ENCOUNTER — Encounter: Payer: Self-pay | Admitting: Family Medicine

## 2019-06-17 ENCOUNTER — Encounter: Payer: Self-pay | Admitting: Gastroenterology

## 2019-06-17 MED ORDER — PANTOPRAZOLE SODIUM 40 MG PO TBEC: 40.0000 mg | DELAYED_RELEASE_TABLET | Freq: Two times a day (BID) | ORAL | 6 refills | Status: DC

## 2019-06-17 NOTE — Telephone Encounter (Signed)
No visit with me is needed at this time. Stop pepcid. Take pantoprazole 40mg  every morning before BF and every evening before supper. Make sure you have elevated the head of your bed with a brick or 2 x  4. Don't eat within 2 hours of lying down to go to bed.  Avoid overeating and avoid spicy foods, fatty foods, citrus drinks, alcohol, high fat dairy, coffee, spearmint, peppermint, and chocolate.

## 2019-06-25 ENCOUNTER — Encounter: Payer: Self-pay | Admitting: Gastroenterology

## 2019-06-25 ENCOUNTER — Ambulatory Visit (INDEPENDENT_AMBULATORY_CARE_PROVIDER_SITE_OTHER): Payer: 59 | Admitting: Gastroenterology

## 2019-06-25 VITALS — BP 162/84 | HR 84 | Temp 98.7°F | Ht 58.75 in | Wt 170.0 lb

## 2019-06-25 DIAGNOSIS — K219 Gastro-esophageal reflux disease without esophagitis: Secondary | ICD-10-CM

## 2019-06-25 NOTE — Progress Notes (Signed)
Referring Provider: Tammi Sou, MD Primary Care Physician:  Tammi Sou, MD  Reason for Consultation: Reflux   IMPRESSION:  Reflux not responding to pantoprazole twice daily Recent unintentional weight gain of 30 pounds Prior colonoscopy for rectal bleeding 5 years ago in [place]  I have recommended an EGD with esophageal and gastric biopsies given the differential of reflux esophagitis, persistent H pylori, PUD, gastritis, non-erosive reflux disease, esophageal ulcer, and even malignancy.  I suspect her recent exacerbation is related to her unintentional weight gain.  Working to achieve a healthy weight should be a high priority.  Plan PPI BID and add an evening H2 blocker.   PLAN: Reviewed lifestyle modification, brochure provided, reviewed recommendations for wedge pillow versus raising the head of the bed instead of sleeping on pillows Continue pantoprazole 40 mg twice daily and famotidine 20 mg QHS EGD Obtain prior endoscopy records from [facility], [place]  Please see the "Patient Instructions" section for addition details about the plan.  HPI: Martha [name] is a 58 y.o. female referred by Dr. Ernestine Conrad for reflux.  The history is obtained to the patient and review of her electronic health record.  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  She is a longstanding history of reflux that developed after her cholecystectomy.  Symptoms are daily with frequent nocturnal regurgitation and brash.Awakes with a sore throat each morning.  Having more frequent breakthrough symptoms despite   No cough, choking, dysphonia, dyspepsia, nausea, abdominal pain.  There is no change in her bowel habits.  No evidence for GI bleeding, iron deficiency anemia, anorexia, unexplained weight loss, dysphagia, odynophagia, persistent vomiting, or gastrointestinal cancer in a first-degree relative.  In fact,  she is had an unintended 30 pound weight gain since she has been working at home and has been unable to do Zumba.  Despite a switch by Dr. Ernestine Conrad to pantoprazole 40 mg twice daily she is continued to have ongoing symptoms.  Will use Pepto-Bismol for breakthrough symptoms resulting in black stools.  She is tried sleeping on 2 pillows with no change in her symptoms.  Labs 08/11/2018: Sodium 148, potassium 4.8, AST 20, ALT 22, total bilirubin 0.2, alk phos 111, hemoglobin A1c 5.1, TSH 0.64  Rectal bleeding at the time of colonoscopy 5 years ago.  No personal history of colon polyps.  No other ongoing GI symptoms.  No known family history of colon cancer or polyps. No family history of uterine/endometrial cancer, pancreatic cancer or gastric/stomach cancer.   Past Medical History:  Diagnosis Date  . Allergic rhinitis   . Arthralgia of multiple sites    Gen rheum lab w/u normal/neg 11/2016 by prior PCP  . GERD (gastroesophageal reflux disease)   . History of rectal bleeding    remote past->? rectal ulcer vs perf'd divertic-->need old records for clarif.  [name] Kitchen Hypercholesterolemia    Past hx of statin use, then was able to come off meds when she lost wt  . Hypertension   . Insomnia   . Prediabetes 2017   Old PCP records state only that her Hba1c was 6.1% in 2017: she took metformin x ? 6-12 mo, rx'd by wt loss MD.  A1c 6.1% Nov 2019.  . Right hip pain    MRI showed labral tear, got steroid injection    Past Surgical History:  Procedure Laterality Date  . ABDOMINAL HYSTERECTOMY  2005   for benign  dx (No BSO)  . BREAST BIOPSY  2005  . CESAREAN SECTION     x 2  . CHOLECYSTECTOMY  2011  . COLONOSCOPY  most recent 01/2012   x 2->approx 2011 was done for BRBPR, unclear dx (rectal ulcer? perf'd diverticulum?).  Another about 2015 for abd pain w/u?  No polyps detected on either colonoscopy.  Need old records for clarification (GI MD was Dr. Candiss [place] in [place])--prior pcp records say "colonoscopy  01/2012 negative".  . ESOPHAGOGASTRODUODENOSCOPY     done as part of abd pain w/u: showed GER but o/w was normal.  . TONSILLECTOMY AND ADENOIDECTOMY  2009    Allergies as of 06/25/2019 - Review Complete 06/25/2019  Allergen Reaction Noted  . Codeine Anaphylaxis 01/29/2019    Family History  Problem Relation Age of Onset  . Diabetes Mother   . Alcohol abuse Mother   . Rheumatic fever Father   . Heart attack Father   . Diabetes Brother     Social History   Socioeconomic History  . Marital status: Married    Spouse name: Not on file  . Number of children: Not on file  . Years of education: Not on file  . Highest education level: Not on file  Occupational History  . Not on file  Social Needs  . Financial resource strain: Not on file  . Food insecurity    Worry: Not on file    Inability: Not on file  . Transportation needs    Medical: Not on file    Non-medical: Not on file  Tobacco Use  . Smoking status: Never Smoker  . Smokeless tobacco: Never Used  Substance and Sexual Activity  . Alcohol use: Never    Frequency: Never  . Drug use: Never  . Sexual activity: Not on file  Lifestyle  . Physical activity    Days per week: Not on file    Minutes per session: Not on file  . Stress: Not on file  Relationships  . Social Herbalist on phone: Not on file    Gets together: Not on file    Attends religious service: Not on file    Active member of club or organization: Not on file    Attends meetings of clubs or organizations: Not on file    Relationship status: Not on file  . Intimate partner violence    Fear of current or ex partner: Not on file    Emotionally abused: Not on file    Physically abused: Not on file    Forced sexual activity: Not on file  Other Topics Concern  . Not on file  Social History Narrative   Married, 2 daughters.   Moved to [place] from [facility] 2019 to be closer to daughter and grandchildren.   Educ: College in [place].     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   In [place], she works from home for [employer].    Review of Systems: 12 system ROS is negative except as noted above with the additions of allergies, anxiety, cough, fatigue, muscle pains and cramps, insomnia.[name] Kitchen   Physical Exam: General:   Alert,  well-nourished, pleasant and cooperative in NAD Head:  Normocephalic and atraumatic. Eyes:  Sclera clear, no icterus.   Conjunctiva pink. Ears:  Normal auditory acuity. Nose:  No deformity, discharge,  or lesions. Mouth:  No deformity or lesions.   Neck:  Supple; no masses or thyromegaly. Lungs:  Clear  throughout to auscultation.   No wheezes. Heart:  Regular rate and rhythm; no murmurs. Abdomen:  Soft,nontender, nondistended, normal bowel sounds, no rebound or guarding. No hepatosplenomegaly.   Rectal:  Deferred  Msk:  Symmetrical. No boney deformities LAD: No inguinal or umbilical LAD Extremities:  No clubbing or edema. Neurologic:  Alert and  oriented x4;  grossly nonfocal Skin:  Intact without significant lesions or rashes. Psych:  Alert and cooperative. Normal mood and affect.     Martha Macinnes L. Tarri Glenn, MD, MPH 06/25/2019, 1:43 PM

## 2019-06-25 NOTE — Patient Instructions (Addendum)
Continue pantoprazole 40 mg twice daily. Add famotidine 20 mg every night.  Please take your pantoprazole 30 minutes before meals Avoid any dietary triggers Avoid spicy and acidic foods Limit your intake of coffee, tea, alcohol, and carbonated drinks Work to maintain a healthy weight. I think this may provide significant improvement in your symptoms.  Keep the head of the bed elevated with blocks if you are having any nighttime symptoms. A wedge pillow may provide the same benefit.  Stay upright for 2 hours after eating Avoid meals and snacks three to four hours before bedtime  I have recommended an upper endoscopy for further evaluation.

## 2019-06-26 ENCOUNTER — Ambulatory Visit (AMBULATORY_SURGERY_CENTER): Payer: 59 | Admitting: Gastroenterology

## 2019-06-26 ENCOUNTER — Encounter: Payer: Self-pay | Admitting: Gastroenterology

## 2019-06-26 ENCOUNTER — Other Ambulatory Visit: Payer: Self-pay | Admitting: Gastroenterology

## 2019-06-26 ENCOUNTER — Other Ambulatory Visit: Payer: Self-pay

## 2019-06-26 VITALS — BP 138/81 | HR 70 | Temp 87.7°F | Resp 12 | Ht <= 58 in | Wt 170.0 lb

## 2019-06-26 DIAGNOSIS — K210 Gastro-esophageal reflux disease with esophagitis: Secondary | ICD-10-CM

## 2019-06-26 DIAGNOSIS — K2970 Gastritis, unspecified, without bleeding: Secondary | ICD-10-CM | POA: Diagnosis not present

## 2019-06-26 DIAGNOSIS — K449 Diaphragmatic hernia without obstruction or gangrene: Secondary | ICD-10-CM

## 2019-06-26 DIAGNOSIS — K219 Gastro-esophageal reflux disease without esophagitis: Secondary | ICD-10-CM

## 2019-06-26 DIAGNOSIS — K209 Esophagitis, unspecified without bleeding: Secondary | ICD-10-CM

## 2019-06-26 MED ORDER — SODIUM CHLORIDE 0.9 % IV SOLN: 500.0000 mL | Freq: Once | INTRAVENOUS | Status: DC

## 2019-06-26 NOTE — Patient Instructions (Signed)
CONTINUE  PANTOPRAZOLE 40 MG TWICE DAILY AND FAMOTIDINE 20 MG AT NIGHT.  FOLLOW UP OFFICE APPOINTMENT NEXT AVAILABLE TO REVIEW.    YOU HAD AN ENDOSCOPIC PROCEDURE TODAY AT [facility]:   Refer to the procedure report that was given to you for any specific questions about what was found during the examination.  If the procedure report does not answer your questions, please call your gastroenterologist to clarify.  If you requested that your care partner not be given the details of your procedure findings, then the procedure report has been included in a sealed envelope for you to review at your convenience later.  YOU SHOULD EXPECT: Some feelings of bloating in the abdomen. Passage of more gas than usual.  Walking can help get rid of the air that was put into your GI tract during the procedure and reduce the bloating. If you had a lower endoscopy (such as a colonoscopy or flexible sigmoidoscopy) you may notice spotting of blood in your stool or on the toilet paper. If you underwent a bowel prep for your procedure, you may not have a normal bowel movement for a few days.  Please Note:  You might notice some irritation and congestion in your nose or some drainage.  This is from the oxygen used during your procedure.  There is no need for concern and it should clear up in a day or so.  SYMPTOMS TO REPORT IMMEDIATELY:    Following upper endoscopy (EGD)  Vomiting of blood or coffee ground material  New chest pain or pain under the shoulder blades  Painful or persistently difficult swallowing  New shortness of breath  Fever of 100F or higher  Black, tarry-looking stools  For urgent or emergent issues, a gastroenterologist can be reached at any hour by calling [phone].   DIET:  We do recommend a small meal at first, but then you may proceed to your regular diet.  Drink plenty of fluids but you should avoid alcoholic beverages for 24 hours.  ACTIVITY:  You should plan to  take it easy for the rest of today and you should NOT DRIVE or use heavy machinery until tomorrow (because of the sedation medicines used during the test).    FOLLOW UP: Our staff will call the number listed on your records 48-72 hours following your procedure to check on you and address any questions or concerns that you may have regarding the information given to you following your procedure. If we do not reach you, we will leave a message.  We will attempt to reach you two times.  During this call, we will ask if you have developed any symptoms of COVID 19. If you develop any symptoms (ie: fever, flu-like symptoms, shortness of breath, cough etc.) before then, please call [phone].  If you test positive for Covid 19 in the 2 weeks post procedure, please call and report this information to [place].    If any biopsies were taken you will be contacted by phone or by letter within the next 1-3 weeks.  Please call us at [phone] if you have not heard about the biopsies in 3 weeks.    SIGNATURES/CONFIDENTIALITY: You and/or your care partner have signed paperwork which will be entered into your electronic medical record.  These signatures attest to the fact that that the information above on your After Visit Summary has been reviewed and is understood.  Full responsibility of the confidentiality of this discharge information lies with you  and/or your care-partner. 

## 2019-06-26 NOTE — Op Note (Signed)
[facility] Patient Name: Martha LyeCarmen Curd Procedure Date: 06/26/2019 10:40 AM MRN: [id number] Endoscopist: Tressia DanasKimberly Orella Cushman MD, MD Age: 58 Referring MD:  Date of Birth: 09/13/1961 Gender: Female Account #: [id number] Procedure:                Upper GI endoscopy Indications:              Reflux not responding to pantoprazole twice daily                           Recent unintentional weight gain of 30 pounds Medicines:                See the Anesthesia note for documentation of the                            administered medications Procedure:                Pre-Anesthesia Assessment:                           - Prior to the procedure, a History and Physical                            was performed, and patient medications and                            allergies were reviewed. The patient's tolerance of                            previous anesthesia was also reviewed. The risks                            and benefits of the procedure and the sedation                            options and risks were discussed with the patient.                            All questions were answered, and informed consent                            was obtained. Prior Anticoagulants: The patient has                            taken no previous anticoagulant or antiplatelet                            agents. ASA Grade Assessment: II - A patient with                            mild systemic disease. After reviewing the risks                            and benefits, the patient was deemed in  satisfactory condition to undergo the procedure.                           After obtaining informed consent, the endoscope was                            passed under direct vision. Throughout the                            procedure, the patient's blood pressure, pulse, and                            oxygen saturations were monitored continuously. The   Endoscope was introduced through the mouth, and                            advanced to the third part of duodenum. The upper                            GI endoscopy was accomplished without difficulty.                            The patient tolerated the procedure well. Scope In: Scope Out: Findings:                 LA Grade A (one or more mucosal breaks less than 5                            mm, not extending between tops of 2 mucosal folds)                            esophagitis with no bleeding was found. Biopsies                            were taken from the distal esophagus as well as                            from the mid and proximal esophagus with a cold                            forceps for histology. Estimated blood loss was                            minimal.                           A small hiatal hernia is present. The entire                            examined stomach was normal. Biopsies were taken                            with a cold forceps for histology.  The examined duodenum was normal.                           The cardia and gastric fundus were normal on                            retroflexion. Complications:            No immediate complications. Estimated blood loss:                            Minimal. Estimated Blood Loss:     Estimated blood loss was minimal. Impression:               - LA Grade A reflux esophagitis. Biopsied.                           - A small hiatal hernia is present.                           - Normal stomach. Biopsied.                           - Normal examined duodenum. Recommendation:           - Resume previous diet today.                           - Continue present medications including                            pantoprazole 40 mg twice daily and famotidine 20 mg                            at night                           - Await pathology results.                           - Follow-up office  appointment with me - next                            available - to review these results. Tressia Danas MD, MD 06/26/2019 11:03:46 AM This report has been signed electronically.

## 2019-06-26 NOTE — Progress Notes (Signed)
Temp check by KA/Vital check by CW.  Medical and surgical history reviewed, no changes made.

## 2019-06-26 NOTE — Progress Notes (Signed)
PT taken to PACU. Monitors in place. VSS. Report given to RN. 

## 2019-06-26 NOTE — Progress Notes (Signed)
Called to room to assist during endoscopic procedure.  Patient ID and intended procedure confirmed with present staff. Received instructions for my participation in the procedure from the performing physician.  

## 2019-06-27 ENCOUNTER — Other Ambulatory Visit: Payer: Self-pay | Admitting: Family Medicine

## 2019-06-29 ENCOUNTER — Encounter: Payer: Self-pay | Admitting: *Deleted

## 2019-06-30 ENCOUNTER — Telehealth: Payer: Self-pay

## 2019-06-30 ENCOUNTER — Telehealth: Payer: Self-pay | Admitting: *Deleted

## 2019-06-30 NOTE — Telephone Encounter (Signed)
First attempt follow up call to pt, no answer. 

## 2019-06-30 NOTE — Telephone Encounter (Signed)
Second follow up call attempt.  Phone rang several times and then disconnected.  Attempted x 2.

## 2019-07-01 ENCOUNTER — Encounter: Payer: Self-pay | Admitting: Gastroenterology

## 2019-07-10 ENCOUNTER — Telehealth: Payer: Self-pay | Admitting: Family Medicine

## 2019-07-10 NOTE — Telephone Encounter (Signed)
[facility] is requesting a call back about prescription Famotivine.

## 2019-07-10 NOTE — Telephone Encounter (Signed)
Contacted pharmacy. Famotidine was discontinued.  Stated in last OV 04/06/2019 that patient was doing well with Martha [name] on Pantoprazole 40mg  and pepcid 40mg .

## 2019-07-11 ENCOUNTER — Encounter: Payer: Self-pay | Admitting: Family Medicine

## 2019-07-23 ENCOUNTER — Ambulatory Visit (INDEPENDENT_AMBULATORY_CARE_PROVIDER_SITE_OTHER): Payer: 59 | Admitting: Gastroenterology

## 2019-07-23 ENCOUNTER — Encounter: Payer: Self-pay | Admitting: Gastroenterology

## 2019-07-23 VITALS — BP 138/82 | HR 76 | Temp 97.8°F | Ht 59.0 in | Wt 168.0 lb

## 2019-07-23 DIAGNOSIS — K2100 Gastro-esophageal reflux disease with esophagitis, without bleeding: Secondary | ICD-10-CM | POA: Diagnosis not present

## 2019-07-23 NOTE — Progress Notes (Signed)
Referring Provider: Tammi Sou, MD Primary Care Physician:  Tammi Sou, MD  Chief complaint: Reflux   IMPRESSION:  Refractory reflux not responding to pantoprazole twice daily and famotidine 20 mg QHS    - LA Class A reflux esophagitis on EGD 06/26/19    - Biopsies negative for EOE 06/26/19 Recent unintentional weight gain of 30 pounds Prior colonoscopy for rectal bleeding 5 years ago in [place]  EGD shows refractory reflux despite PPI BID and daily H2Blocker. No concurrent diagnosis such as eosinophilic esophagitis. Esophageal impedance pH testing and esophageal manometry recommended on treatment for further evaluation. Continuing to work to achieve a healthy weight should be a high priority.  I did not make any medication changes at this time.   PLAN: Congratulated for implementation of lifestyle modifications Continue pantoprazole 40 mg twice daily and famotidine 20 mg QHS Esophageal impedance pH testing and esophageal manometry on treatment Obtain prior endoscopy records from [place], [place] Follow-up 2 weeks after the impedance testing  Please see the "Patient Instructions" section for addition details about the plan.  HPI: Martha [name] is a 58 y.o. female who returns for follow-up of reflux.  The interval history is obtained to the patient and review of her electronic health record.  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  She has a longstanding history of reflux that developed after her cholecystectomy.  Symptoms were daily with frequent nocturnal regurgitation and brash despite pantoprazole 40 twice daily and famotidine 20 mg .   EGD 06/26/19 showed LA Class A reflux esophagitis and a small hiatal hernia. Biopsies showed mild chronic gastritis and mild reflux. There was no intestinal metaplasia or eosinophilic esophagitis.    Has tried to stop eating after 6:30  pm. Avoiding tomatoes, citrus, spices, beans. No symptoms at bedtime. Awakes with symptoms and coughing around 2am most mornings. Trying a wedge pillow. Finds that she rolls off it at night.  Doing outdoor [place] weekly on Wednesdays and yoga weekly on Tuesdays.   She has seen a 1-2 pound weight loss and even more inches with her increased activity.  Keeping a food journal by her bed to try to identify triggers of her nighttime symptoms.   No evidence for GI bleeding, iron deficiency anemia, anorexia, unexplained weight loss, dysphagia, odynophagia, persistent vomiting, or gastrointestinal cancer in a first-degree relative.    Past Medical History:  Diagnosis Date  . Allergic rhinitis   . Anemia   . Arthralgia of multiple sites    Gen rheum lab w/u normal/neg 11/2016 by prior PCP  . GERD (gastroesophageal reflux disease)    +grade A esophagitis 06/2019 EGD.  Bx neg for eosinophilic esoph.  . History of rectal bleeding    remote past->? rectal ulcer vs perf'd divertic-->need old records for clarif.  [name] Kitchen Hypercholesterolemia    Past hx of statin use, then was able to come off meds when she lost wt  . Hypertension   . Insomnia   . Prediabetes 2017   Old PCP records state only that her Hba1c was 6.1% in 2017: she took metformin x ? 6-12 mo, rx'd by wt loss MD.  A1c 6.1% Nov 2019.  . Right hip pain    MRI showed labral tear, got steroid injection    Past Surgical History:  Procedure Laterality Date  . ABDOMINAL HYSTERECTOMY  2005   for benign dx (No BSO)  . BREAST  BIOPSY Right 2005  . CESAREAN SECTION     x 2  . CHOLECYSTECTOMY  2011  . COLONOSCOPY  most recent 01/2012   x 2->approx 2011 was done for BRBPR, unclear dx (rectal ulcer? perf'd diverticulum?).  Another about 2015 for abd pain w/u?  No polyps detected on either colonoscopy.  Need old records for clarification (GI MD was Dr. Thedore Mins in [place])--prior pcp records say "colonoscopy 01/2012 negative".  . ESOPHAGOGASTRODUODENOSCOPY        06/2019->reflux esophagitis, o/w normal.  . TONSILLECTOMY AND ADENOIDECTOMY  2009    Allergies as of 07/23/2019 - Review Complete 06/26/2019  Allergen Reaction Noted  . Codeine Anaphylaxis 01/29/2019    Family History  Problem Relation Age of Onset  . Diabetes Mother   . Alcohol abuse Mother   . Rheumatic fever Father   . Heart attack Father   . Diabetes Brother   . Breast cancer Maternal Aunt   . Colon cancer Neg Hx   . Esophageal cancer Neg Hx   . Rectal cancer Neg Hx   . Stomach cancer Neg Hx     Social History   Socioeconomic History  . Marital status: Married    Spouse name: Not on file  . Number of children: 2  . Years of education: Not on file  . Highest education level: Not on file  Occupational History  . Occupation: English as a second language teacher  Social Needs  . Financial resource strain: Not on file  . Food insecurity    Worry: Not on file    Inability: Not on file  . Transportation needs    Medical: Not on file    Non-medical: Not on file  Tobacco Use  . Smoking status: Never Smoker  . Smokeless tobacco: Never Used  Substance and Sexual Activity  . Alcohol use: Yes    Frequency: Never    Comment: wine occasional  . Drug use: Never  . Sexual activity: Not on file  Lifestyle  . Physical activity    Days per week: Not on file    Minutes per session: Not on file  . Stress: Not on file  Relationships  . Social Musician on phone: Not on file    Gets together: Not on file    Attends religious service: Not on file    Active member of club or organization: Not on file    Attends meetings of clubs or organizations: Not on file    Relationship status: Not on file  . Intimate partner violence    Fear of current or ex partner: Not on file    Emotionally abused: Not on file    Physically abused: Not on file    Forced sexual activity: Not on file  Other Topics Concern  . Not on file  Social History Narrative   Married, 2 daughters.    Moved to [facility] from [facility] 2019 to be closer to daughter and grandchildren.   Educ: College in [place].     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   In [place], she works from home for [employer].    Physical Exam: General:   Alert,  well-nourished, pleasant and cooperative in NAD Head:  Normocephalic and atraumatic. Eyes:  Sclera clear, no icterus.   Conjunctiva pink. Abdomen:  Soft, nontender, nondistended, normal bowel sounds, no rebound or guarding. No hepatosplenomegaly.   Extremities:  No clubbing or edema. Neurologic:  Alert and  oriented x4;  grossly  nonfocal Skin:  Intact without significant lesions or rashes. Psych:  Alert and cooperative. Normal mood and affect.     Siham Bucaro L. Orvan FalconerBeavers, MD, MPH 07/23/2019, 1:43 PM

## 2019-07-23 NOTE — Patient Instructions (Addendum)
Patient advised to avoid spicy, acidic, citrus, chocolate, mints, fruit and fruit juices.  Limit the intake of caffeine, alcohol and Soda.  Don't exercise too soon after eating.  Don't lie down within 3-4 hours of eating.  Elevate the head of your bed.  Continue pantoprazole 40 mg twice daily and famotidine 20 mg at bedtime.   Due to recent COVID-19 restrictions implemented by our local and state authorities and in an effort to keep both patients and staff as safe as possible, our hospital system now requires COVID-19 testing prior to any scheduled hospital procedure. Please go to our [facility] location drive thru testing site ([address] [place], [place] [address]) on 08-01-2019 at 9:30 am. There will be multiple testing areas, the first checkpoint being for pre-procedure/surgery testing. Get into the right (yellow) lane that leads to the PAT testing team. You will not be billed at the time of testing but may receive a bill later depending on your insurance. The approximate cost of the test is $100. You must agree to quarantine from the time of your testing until the procedure date on 08-05-2019 . This should include staying at home with ONLY the people you live with. Avoid take-out, grocery store shopping or leaving the house for any non-emergent reason. Failure to have your COVID-19 test done on the date and time you have been scheduled will result in cancellation of procedure. Please call our office at [phone] if you have any questions.     You have been scheduled for an esophageal manometry at [facility] on 08-05-2019 at 8:30 am. Please arrive 30 minutes prior to your procedure for registration. You will need to go to outpatient registration (1st floor of the hospital) first. Make certain to bring your insurance cards as well as a complete list of medications.  Please remember the following:  1) Do not take any muscle relaxants, xanax (alprazolam) or ativan for 1 day  prior to your test as well as the day of the test.  2) Nothing to eat or drink for 4 hours before your test.  3) Hold all diabetic medications/insulin the morning of the test. You may eat and take your medications after the test.  It will take at least 2 weeks to receive the results of this test from your physician. ------------------------------------------ ABOUT ESOPHAGEAL MANOMETRY Esophageal manometry (muh-NOM-uh-tree) is a test that gauges how well your esophagus works. Your esophagus is the long, muscular tube that connects your throat to your stomach. Esophageal manometry measures the rhythmic muscle contractions (peristalsis) that occur in your esophagus when you swallow. Esophageal manometry also measures the coordination and force exerted by the muscles of your esophagus.  During esophageal manometry, a thin, flexible tube (catheter) that contains sensors is passed through your nose, down your esophagus and into your stomach. Esophageal manometry can be helpful in diagnosing some mostly uncommon disorders that affect your esophagus.  Why it's done Esophageal manometry is used to evaluate the movement (motility) of food through the esophagus and into the stomach. The test measures how well the circular bands of muscle (sphincters) at the top and bottom of your esophagus open and close, as well as the pressure, strength and pattern of the wave of esophageal muscle contractions that moves food along.  What you can expect Esophageal manometry is an outpatient procedure done without sedation. Most people tolerate it well. You may be asked to change into a hospital gown before the test starts.  During esophageal manometry  .  While you are sitting up, a member of your health care team sprays your throat with a numbing medication or puts numbing gel in your nose or both.  . A catheter is guided through your nose into your esophagus. The catheter may be sheathed in a water-filled sleeve. It doesn't  interfere with your breathing. However, your eyes may water, and you may gag. You may have a slight nosebleed from irritation.  . After the catheter is in place, you may be asked to lie on your back on an exam table, or you may be asked to remain seated.  . You then swallow small sips of water. As you do, a computer connected to the catheter records the pressure, strength and pattern of your esophageal muscle contractions.  . During the test, you'll be asked to breathe slowly and smoothly, remain as still as possible, and swallow only when you're asked to do so.  . A member of your health care team may move the catheter down into your stomach while the catheter continues its measurements.  . The catheter then is slowly withdrawn. The test usually lasts 20 to 30 minutes.  After esophageal manometry  When your esophageal manometry is complete, you may return to your normal activities  This test typically takes 30-45 minutes to complete. ________________________________________________________________________________

## 2019-08-01 ENCOUNTER — Other Ambulatory Visit (HOSPITAL_COMMUNITY)
Admission: RE | Admit: 2019-08-01 | Discharge: 2019-08-01 | Disposition: A | Discharge status: Home / Self Care | Payer: 59 | Source: Ambulatory Visit | Attending: Gastroenterology | Admitting: Gastroenterology

## 2019-08-01 DIAGNOSIS — Z01812 Encounter for preprocedural laboratory examination: Secondary | ICD-10-CM | POA: Diagnosis not present

## 2019-08-01 DIAGNOSIS — Z20828 Contact with and (suspected) exposure to other viral communicable diseases: Secondary | ICD-10-CM | POA: Diagnosis not present

## 2019-08-02 ENCOUNTER — Encounter: Payer: Self-pay | Admitting: Family Medicine

## 2019-08-02 LAB — NOVEL CORONAVIRUS, NAA (HOSP ORDER, SEND-OUT TO REF LAB; TAT 18-24 HRS): SARS-CoV-2, NAA: NOT DETECTED

## 2019-08-05 ENCOUNTER — Encounter (HOSPITAL_COMMUNITY)
Admission: RE | Disposition: A | Discharge status: Home / Self Care | Payer: Self-pay | Source: Home / Self Care | Attending: Gastroenterology

## 2019-08-05 ENCOUNTER — Encounter (HOSPITAL_COMMUNITY): Payer: Self-pay | Admitting: *Deleted

## 2019-08-05 ENCOUNTER — Ambulatory Visit (HOSPITAL_COMMUNITY)
Admission: RE | Admit: 2019-08-05 | Discharge: 2019-08-05 | Disposition: A | Discharge status: Home / Self Care | Payer: 59 | Attending: Gastroenterology | Admitting: Gastroenterology

## 2019-08-05 DIAGNOSIS — K219 Gastro-esophageal reflux disease without esophagitis: Secondary | ICD-10-CM | POA: Diagnosis not present

## 2019-08-05 DIAGNOSIS — K449 Diaphragmatic hernia without obstruction or gangrene: Secondary | ICD-10-CM | POA: Diagnosis not present

## 2019-08-05 HISTORY — PX: 24 HOUR PH STUDY: SHX5419

## 2019-08-05 HISTORY — PX: ESOPHAGEAL MANOMETRY: SHX5429

## 2019-08-05 SURGERY — MANOMETRY, ESOPHAGUS

## 2019-08-05 MED ORDER — LIDOCAINE VISCOUS HCL 2 % MT SOLN
OROMUCOSAL | Status: AC
  Filled 2019-08-05: qty 15

## 2019-08-05 SURGICAL SUPPLY — 2 items
FACESHIELD LNG OPTICON STERILE (SAFETY) IMPLANT
GLOVE BIO SURGEON STRL SZ8 (GLOVE) ×4 IMPLANT

## 2019-08-05 NOTE — Progress Notes (Signed)
Esophageal manometry done per protocol.  Patient tolerated well.  PH probe then placed at 33 cm from left nare.  Patient verbalized understanding of equipment and when to return to have probe removed.  Report to be sent to Dr Harl Bowie.

## 2019-08-06 ENCOUNTER — Encounter (HOSPITAL_COMMUNITY): Payer: Self-pay | Admitting: Gastroenterology

## 2019-08-14 ENCOUNTER — Encounter: Payer: Self-pay | Admitting: Family Medicine

## 2019-08-18 DIAGNOSIS — K449 Diaphragmatic hernia without obstruction or gangrene: Secondary | ICD-10-CM

## 2019-08-18 DIAGNOSIS — K219 Gastro-esophageal reflux disease without esophagitis: Secondary | ICD-10-CM

## 2019-08-24 ENCOUNTER — Encounter: Payer: Self-pay | Admitting: Gastroenterology

## 2019-08-24 ENCOUNTER — Ambulatory Visit (INDEPENDENT_AMBULATORY_CARE_PROVIDER_SITE_OTHER): Payer: 59 | Admitting: Gastroenterology

## 2019-08-24 VITALS — BP 128/80 | HR 85 | Temp 98.3°F | Ht 59.0 in | Wt 171.0 lb

## 2019-08-24 DIAGNOSIS — K2100 Gastro-esophageal reflux disease with esophagitis, without bleeding: Secondary | ICD-10-CM | POA: Diagnosis not present

## 2019-08-24 NOTE — Progress Notes (Signed)
Referring Provider: Jeoffrey MassedMcGowen, Philip H, MD Primary Care Physician:  Jeoffrey MassedMcGowen, Philip H, MD  Chief complaint: Reflux   IMPRESSION:  Refractory reflux not responding to pantoprazole twice daily and famotidine 20 mg QHS    - LA Class A reflux esophagitis on EGD 06/26/19    - Biopsies negative for EOE 06/26/19    -  Esophageal impedance pH testing and esophageal manometry consistent with reflux 08/05/19    - 24-hour pH probe with impedance study 08/05/2019: significant reflux in the supine position; DeMeester score 16.3    -  Esophageal manometry 08/05/2019: normal relaxation of the EG junction.  Normal peristalsis.  Recent unintentional weight gain of 30 pounds [place]  Esophageal impedance pH testing and esophageal manometry shows refractory reflux despite PPI BID and daily H2Blocker. Continuing to work to achieve a healthy weight should be a high priority.  I did not make any medication changes at this time.  She remains frustrated by her unintentional weight gain with difficulty losing weight.  She specifically asked for recommendations for help with weight loss.  We discussed a [facility].   PLAN: Continue lifestyle modifications - particularly avoiding the supine position Continue pantoprazole 40 mg twice daily and increase famotidine to 20 mg BID Focus on working to maintain a healthy weight Referral to Dr. Dalbert GarnetBeasley at [facility] Return to this clinic in 2-3 months, or earlier as needed  Please see the "Patient Instructions" section for addition details about the plan.  HPI: Martha [name] is a 58 y.o. female who returns for follow-up of reflux.  The interval history is obtained to the patient and review of her electronic health record.  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  She has a longstanding history of reflux that developed after her cholecystectomy.  Symptoms were daily with frequent nocturnal regurgitation and brash despite pantoprazole 40 twice daily and famotidine 20 mg.   EGD 06/26/19 showed LA Class A reflux esophagitis and a small hiatal hernia. Biopsies showed mild chronic gastritis and mild reflux. There was no intestinal metaplasia or eosinophilic esophagitis.    She returns in follow-up after her 24-hour pH probe with impedance study and esophageal manometry.  24-hour pH probe with impedance study 08/05/2019 showed evidence of significant gastroesophageal reflux in the supine position with an adequate acid suppression.  Her DeMeester score was 16.3.  Esophageal manometry 08/05/2019 showed normal relaxation of the EG junction.  There was no significant esophageal peristaltic abnormality detected on this study.  She has made multiple lifestyle modifications.  She does not eat after 6:30 PM.  She is kept a food journal to identified specific food triggers.  So far she is avoiding tomatoes, citrus, spices, and beans.  She has a wedge pillow but finds that she rolls off of it at night.  She is participating in WayneZumba and yoga and trying to lose weight. Soes not have a way to raise the head of her old antique bed. Considering getting a sleep number bed.   She remains frustrated by ongoing weight gain despite her efforts.  She has known symptoms at bedtime but will awake with coughing and a smothering sensation around 2 AM.  She is often unable to return to sleep until 4 AM. Notes association with stress. She moved to [place] to be close to  her daughter and grandchild. Her daughter has just announced that she is moving to [place].      Past Medical History:  Diagnosis Date  . Allergic rhinitis   . Anemia   . Arthralgia of multiple sites    Gen rheum lab w/u normal/neg 11/2016 by prior PCP  . GERD (gastroesophageal reflux disease)    +grade A esophagitis  06/2019 EGD.  Bx neg for eosinophilic esoph. 38/2505 esoph manometry normal, pH probe + abnl reflux not fully suppressed by PPI.   [name] Kitchen History of rectal bleeding    remote past->? rectal ulcer vs perf'd divertic-->need old records for clarif.  [name] Kitchen Hypercholesterolemia    Past hx of statin use, then was able to come off meds when she lost wt  . Hypertension   . Insomnia   . Prediabetes 2017   Old PCP records state only that her Hba1c was 6.1% in 2017: she took metformin x ? 6-12 mo, rx'd by wt loss MD.  A1c 6.1% Nov 2019.  . Right hip pain    MRI showed labral tear, got steroid injection    Past Surgical History:  Procedure Laterality Date  . [place] STUDY N/A 08/05/2019   +GERD. Procedure: [place] STUDY;  Surgeon: Thornton Park, MD;  Location: [facility];  Service: Gastroenterology;  Laterality: N/A;  . ABDOMINAL HYSTERECTOMY  2005   for benign dx (No BSO)  . BREAST BIOPSY Right 2005  . CESAREAN SECTION     x 2  . CHOLECYSTECTOMY  2011  . COLONOSCOPY  most recent 01/2012   x 2->approx 2011 was done for BRBPR, unclear dx (rectal ulcer? perf'd diverticulum?).  Another about 2015 for abd pain w/u?  No polyps detected on either colonoscopy.  Need old records for clarification (GI [place] Dr[place] Candiss [place] in [place]prior pcp records say "colonoscopy 01/2012 negative".  . ESOPHAGEAL MANOMETRY N/A 08/05/2019   NORMAL Procedure: ESOPHAGEAL MANOMETRY (EM);  Surgeon: Thornton Park, MD;  Location: [facility];  Service: Gastroenterology;  Laterality: N/A;  . ESOPHAGOGASTRODUODENOSCOPY       06/2019->reflux esophagitis, o/w normal.  . TONSILLECTOMY AND ADENOIDECTOMY  2009    Allergies as of 08/24/2019 - Review Complete 08/05/2019  Allergen Reaction Noted  . Codeine Anaphylaxis 01/29/2019    Family History  Problem Relation Age of Onset  . Diabetes Mother   . Alcohol abuse Mother   . Rheumatic fever Father   . Heart attack Father   . Diabetes Brother   . Breast cancer Maternal Aunt    . Colon cancer Neg Hx   . Esophageal cancer Neg Hx   . Rectal cancer Neg Hx   . Stomach cancer Neg Hx     Social History   Socioeconomic History  . Marital status: Married    Spouse name: Not on file  . Number of children: 2  . Years of education: Not on file  . Highest education level: Not on file  Occupational History  . Occupation: Hydrologist  Social Needs  . Financial resource strain: Not on file  . Food insecurity    Worry: Not on file    Inability: Not on file  . Transportation needs    Medical: Not on file    Non-medical: Not on file  Tobacco Use  . Smoking status: Never Smoker  . Smokeless tobacco: Never Used  Substance and Sexual Activity  . Alcohol use: Yes    Frequency: Never    Comment: wine occasional  .  Drug use: Never  . Sexual activity: Not on file  Lifestyle  . Physical activity    Days per week: Not on file    Minutes per session: Not on file  . Stress: Not on file  Relationships  . Social Musician on phone: Not on file    Gets together: Not on file    Attends religious service: Not on file    Active member of club or organization: Not on file    Attends meetings of clubs or organizations: Not on file    Relationship status: Not on file  . Intimate partner violence    Fear of current or ex partner: Not on file    Emotionally abused: Not on file    Physically abused: Not on file    Forced sexual activity: Not on file  Other Topics Concern  . Not on file  Social History Narrative   Married, 2 daughters.   Moved to [facility] from [facility] 2019 to be closer to daughter and grandchildren.   Educ: College in [place].     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   In [place], she works from home for [employer].    Physical Exam: General:   Alert,  well-nourished, pleasant and cooperative in NAD Head:  Normocephalic and atraumatic. Eyes:  Sclera clear, no icterus.   Conjunctiva pink. Abdomen:  Soft,  nontender, nondistended, normal bowel sounds, no rebound or guarding. No hepatosplenomegaly.   Extremities:  No clubbing or edema. Neurologic:  Alert and  oriented x4;  grossly nonfocal Skin:  Intact without significant lesions or rashes. Psych:  Alert and cooperative. Normal mood and affect.     Oliana Gowens L. Orvan Falconer, MD, MPH 08/24/2019, 1:32 PM

## 2019-08-24 NOTE — Patient Instructions (Addendum)
Continue to take pantoprazole 40 mg twice daily. Please take this 30 minutes prior to your meals.  Increase your famotidine to 20 mg twice daily.   Modifying diet and lifestyle remains the foundation for treating the symptoms of reflux. Your study shows that your acid exposure is worse when you are lying on your back. Please sit upright for at least 3-4 hours after eating.   Continue to work to achieve a healthy weight. This will help improve your symptoms, as well.   Some people find herbs and other natural remedies helpful in treating heartburn symptoms.  Chamomile. A cup of chamomile tea may have a soothing effect on the digestive tract. Avoid chamomile if you're allergic to ragweed.  Ginger. The root of the ginger plant is another well-known herbal digestive aid and has been a folk remedy for heartburn for centuries.  Licorice. This remedy has proved effective in several studies. Licorice is said to increase the mucous coating of the esophageal lining, helping it resist the irritating effects of stomach acid. Deglycyrrhizinated licorice, or DGL, is available in pill or liquid form. It is considered safe to take indefinitely.  Other natural remedies. A variety of other remedies have been used over the centuries, but not enough scientific studies have been done to confirm their effectiveness. Catnip, fennel, marshmallow root, and papaya tea have all been said to aid in digestion and act as a buffer to stop heartburn. Some people eat fresh papaya as a digestive aid. Others swear by raw potato juice, three times a day. However, these remedies have not been reviewed for safety or effectiveness by the FDA.

## 2019-08-25 ENCOUNTER — Other Ambulatory Visit: Payer: Self-pay | Admitting: Family Medicine

## 2019-08-26 ENCOUNTER — Encounter: Payer: Self-pay | Admitting: Family Medicine

## 2019-09-09 ENCOUNTER — Other Ambulatory Visit: Payer: Self-pay

## 2019-09-09 DIAGNOSIS — Z20822 Contact with and (suspected) exposure to covid-19: Secondary | ICD-10-CM

## 2019-09-11 ENCOUNTER — Ambulatory Visit (INDEPENDENT_AMBULATORY_CARE_PROVIDER_SITE_OTHER): Payer: 59 | Admitting: Family Medicine

## 2019-09-11 ENCOUNTER — Other Ambulatory Visit: Payer: Self-pay

## 2019-09-11 ENCOUNTER — Encounter: Payer: Self-pay | Admitting: Family Medicine

## 2019-09-11 VITALS — BP 130/80 | Temp 99.5°F | Wt 159.0 lb

## 2019-09-11 DIAGNOSIS — U071 COVID-19: Secondary | ICD-10-CM | POA: Diagnosis not present

## 2019-09-11 LAB — NOVEL CORONAVIRUS, NAA: SARS-CoV-2, NAA: DETECTED — AB

## 2019-09-11 NOTE — Progress Notes (Signed)
Virtual Visit via Video Note  I connected with pt on 09/11/19 at  3:00 PM EST by a video enabled telemedicine application and verified that I am speaking with the correct person using two identifiers.  Location patient: home Location provider:work or home office Persons participating in the virtual visit: patient, provider  I discussed the limitations of evaluation and management by telemedicine and the availability of in person appointments. The patient expressed understanding and agreed to proceed.  Telemedicine visit is a necessity given the COVID-19 restrictions in place at the current time.  HPI: 58 y/o WF being seen today for respiratory complaints. She had positive covid test 2 days ago. Her husband has respiratory illness, tested + for covid at same time as her.  Onset of sx's last night: fever to 100.9, coughing, hoarse voice, tired. Some ST today.  No HA.  Some mild sinus pressure. No SOB.  No wheezing.  Eating and drinking ok.  No n/v abd pain or diarrhea. Taking tylenol and mucinex dm. Quarantining appropriately.   ROS: See pertinent positives and negatives per HPI.  Past Medical History:  Diagnosis Date  . Allergic rhinitis   . Anemia   . Arthralgia of multiple sites    Gen rheum lab w/u normal/neg 11/2016 by prior PCP  . GERD (gastroesophageal reflux disease)    +grade A esophagitis 06/2019 EGD.  Bx neg for eosinophilic esoph. 16/6063 esoph manometry normal, pH probe + abnl reflux not fully suppressed by PPI. PPI BID + H2 blocker as of 08/2019.  [name] Kitchen History of rectal bleeding    remote past->? rectal ulcer vs perf'd divertic-->need old records for clarif.  [name] Kitchen Hypercholesterolemia    Past hx of statin use, then was able to come off meds when she lost wt  . Hypertension   . Insomnia   . Prediabetes 2017   Old PCP records state only that her Hba1c was 6.1% in 2017: she took metformin x ? 6-12 mo, rx'd by wt loss MD.  A1c 6.1% Nov 2019.  . Right hip pain    MRI showed  labral tear, got steroid injection    Past Surgical History:  Procedure Laterality Date  . [place] STUDY N/A 08/05/2019   +GERD. Procedure: [place] STUDY;  Surgeon: Thornton Park, MD;  [facility];  Service: Gastroenterology;  Laterality: N/A;  . ABDOMINAL HYSTERECTOMY  2005   for benign dx (No BSO)  . BREAST BIOPSY Right 2005  . CESAREAN SECTION     x 2  . CHOLECYSTECTOMY  2011  . COLONOSCOPY  most recent 01/2012   x 2->approx 2011 was done for BRBPR, unclear dx (rectal ulcer? perf'd diverticulum?).  Another about 2015 for abd pain w/u?  No polyps detected on either colonoscopy.  Need old records for clarification (GI [place] was Dr[place] Candiss [place] in [place]prior pcp records say "colonoscopy 01/2012 negative".  . ESOPHAGEAL MANOMETRY N/A 08/05/2019   NORMAL Procedure: ESOPHAGEAL MANOMETRY (EM);  Surgeon: Thornton Park, MD;  [facility];  Service: Gastroenterology;  Laterality: N/A;  . ESOPHAGOGASTRODUODENOSCOPY  06/2019     06/2019->reflux esophagitis, o/w normal.  . TONSILLECTOMY AND ADENOIDECTOMY  2009    Family History  Problem Relation Age of Onset  . Diabetes Mother   . Alcohol abuse Mother   . Rheumatic fever Father   . Heart attack Father   . Diabetes Brother   . Breast cancer Maternal Aunt   . Colon cancer Neg Hx   . Esophageal cancer Neg  Hx   . Rectal cancer Neg Hx   . Stomach cancer Neg Hx     Social History   Socioeconomic History  . Marital status: Married    Spouse name: Not on file  . Number of children: 2  . Years of education: Not on file  . Highest education level: Not on file  Occupational History  . 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 Financial resource strain: Not on file  . Food insecurity    Worry: Not on file    Inability: Not on file  . Transportation needs    Medical: Not on file    Non-medical: Not on file  Tobacco Use  . Smoking status: Never Smoker  . Smokeless tobacco: Never Used  Substance  and Sexual Activity  . Alcohol use: Yes    Frequency: Never    Comment: wine occasional  . Drug use: Never  . Sexual activity: Not on file  Lifestyle  . Physical activity    Days per week: Not on file    Minutes per session: Not on file  . Stress: Not on file  Relationships  . Social Musicianconnections    Talks on phone: Not on file    Gets together: Not on file    Attends religious service: Not on file    Active member of club or organization: Not on file    Attends meetings of clubs or organizations: Not on file    Relationship status: Not on file  Other Topics Concern  . Not on file  Social History Narrative   Married, 2 daughters.   [place] [facility] [place] [facility] [facility] [place] [facility] to be closer to daughter and grandchildren.   Educ: College in [place].     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   In [place], she works from home for [employer].      Current Outpatient Medications:  .  Ascorbic Acid (VITAMIN C) 1000 MG tablet, Take 1,000 mg by mouth daily. , Disp: , Rfl:  .  benzonatate (TESSALON) 100 MG capsule, Take 100 mg by mouth as needed. , Disp: , Rfl:  .  cholecalciferol (VITAMIN D) 25 MCG (1000 UT) tablet, Vitamin D3 25 mcg (1,000 unit) capsule   1 capsule every day by oral route., Disp: , Rfl:  .  fluticasone (FLONASE) 50 MCG/ACT nasal spray, Place 2 sprays into both nostrils daily., Disp: 16 g, Rfl: 6 .  levocetirizine (XYZAL) 5 MG tablet, Take 5 mg by mouth daily. , Disp: , Rfl:  .  lisinopril (ZESTRIL) 5 MG tablet, Take 5 mg by mouth daily. , Disp: , Rfl:  .  meloxicam (MOBIC) 15 MG tablet, Take 15 mg by mouth daily as needed for pain., Disp: , Rfl:  .  montelukast (SINGULAIR) 10 MG tablet, Take 10 mg by mouth at bedtime. , Disp: , Rfl:  .  Omega-3 Fatty Acids (OMEGA-3 FISH OIL PO), Take 1 capsule by mouth daily. , Disp: , Rfl:  .  pantoprazole (PROTONIX) 40 MG tablet, Take 1 tablet (40 mg total) by mouth 2 (two) times daily., Disp: 60 tablet, Rfl:  6  EXAM:  VITALS per patient if applicable: There were no vitals taken for this visit.   GENERAL: alert, oriented, appears well and in no acute distress  HEENT: atraumatic, conjunttiva clear, no obvious abnormalities on inspection of external nose and ears  NECK: normal movements of the head and neck  LUNGS: on inspection no signs  of respiratory distress, breathing rate appears normal, no obvious gross SOB, gasping or wheezing  CV: no obvious cyanosis  MS: moves all visible extremities without noticeable abnormality  PSYCH/NEURO: pleasant and cooperative, no obvious depression or anxiety, speech and thought processing grossly intact  LABS: none today    Chemistry      Component Value Date/Time   NA 148 (A) 08/11/2018   K 4.8 08/11/2018   BUN 10 08/11/2018   CREATININE 0.8 01/03/2018   GLU 91 01/03/2018      Component Value Date/Time   ALKPHOS 111 08/11/2018   AST 20 08/11/2018   ALT 22 08/11/2018      ASSESSMENT AND PLAN:  Discussed the following assessment and plan:  Covid 19 respiratory infection: No symptoms worrisome for severe illness or complications. Symptomatic care, rest, fluids. Signs/symptoms to call or return for were reviewed and pt expressed understanding. Quarantine instructions reviewed with pt.  -we discussed possible serious and likely etiologies, options for evaluation and workup, limitations of telemedicine visit vs in person visit, treatment, treatment risks and precautions. Pt prefers to treat via telemedicine empirically rather then risking or undertaking an in person visit at this moment. Patient agrees to seek prompt in person care if worsening, new symptoms arise, or if is not improving with treatment.   I discussed the assessment and treatment plan with the patient. The patient was provided an opportunity to ask questions and all were answered. The patient agreed with the plan and demonstrated an understanding of the instructions.   The  patient was advised to call back or seek an in-person evaluation if the symptoms worsen or if the condition fails to improve as anticipated.  F/u: if not improving appropriately  Signed:  Santiago Bumpers, MD           09/11/2019

## 2019-09-18 ENCOUNTER — Other Ambulatory Visit: Payer: Self-pay | Admitting: Family Medicine

## 2019-09-18 MED ORDER — LISINOPRIL 5 MG PO TABS: 5.0000 mg | ORAL_TABLET | Freq: Every day | ORAL | 3 refills | Status: DC

## 2019-09-18 MED ORDER — MELOXICAM 15 MG PO TABS: 15.0000 mg | ORAL_TABLET | Freq: Every day | ORAL | 3 refills | Status: DC | PRN

## 2019-09-18 MED ORDER — MONTELUKAST SODIUM 10 MG PO TABS: 10.0000 mg | ORAL_TABLET | Freq: Every day | ORAL | 3 refills | Status: DC

## 2019-09-18 NOTE — Telephone Encounter (Signed)
RF request for Lisinopril LOV:09/11/19 Next ov: advised to f/u Dec or Jan for CPE Last written:n/a  RF request for Meloxicam LOV:09/11/19 Next ov: advised to f/u Dec or Jan for CPE Last written: n/a  RF request for Singulair LOV:09/11/19 Next ov: advised to f/u Dec or Jan for CPE Last written: n/a   None of the medications requested have been prescribed by you. Please advise, thanks. Pt would like 90 d/s. Medications pending

## 2019-09-18 NOTE — Telephone Encounter (Signed)
Patient request refills   lisinopril (ZESTRIL) 5 MG tablet   meloxicam (MOBIC) 15 MG tablet   montelukast (SINGULAIR) 10 MG tablet    Please make sure all prescriptions are 90 d/s and sent to mail order pharmacy. - Please add [facility] to patient's preferred pharmacy.

## 2019-09-18 NOTE — Telephone Encounter (Signed)
All Rx's sent, pt notified.

## 2019-10-21 ENCOUNTER — Ambulatory Visit: Payer: 59 | Attending: Internal Medicine

## 2019-10-21 DIAGNOSIS — Z20822 Contact with and (suspected) exposure to covid-19: Secondary | ICD-10-CM

## 2019-10-22 LAB — NOVEL CORONAVIRUS, NAA: SARS-CoV-2, NAA: NOT DETECTED

## 2019-10-26 ENCOUNTER — Telehealth: Payer: Self-pay | Admitting: Family Medicine

## 2019-10-26 NOTE — Telephone Encounter (Signed)
Patient advised and voiced understanding.  

## 2019-10-26 NOTE — Telephone Encounter (Signed)
Patient is experiencing 2x a day. First thing in the morning when she blows her nose and at night, more congestion and blowing thick mucus w/ blood.   Please advise, thanks.

## 2019-10-26 NOTE — Telephone Encounter (Signed)
Patient reports she is having some nose bleeds recently. She feels like it is because of running "heat".  First time in [place] for winter. She moved here from [place], where she never had to run "heat'.  Had COVID back in November, she feels like she's never really gotten over it.  She did go last week for another COVID test because of the sinus congestion and cold symptoms she is having. COVID test was negative. She is requesting what to do about nose bleeds, or over the counter, or does she need antibiotic.   Please call patient [phone]

## 2019-10-26 NOTE — Telephone Encounter (Signed)
No meds or testing needed. I highly recommend she use otc nasal saline spray at least once a day-> 2 sprays in each nostril and then blow nose. Also, I highly recommend she get a cool mist humidifier and put it next to her bed every night.-thx

## 2019-11-11 ENCOUNTER — Other Ambulatory Visit: Payer: 59

## 2019-11-12 ENCOUNTER — Ambulatory Visit: Payer: 59 | Attending: Internal Medicine

## 2019-11-12 DIAGNOSIS — Z20822 Contact with and (suspected) exposure to covid-19: Secondary | ICD-10-CM

## 2019-11-13 LAB — NOVEL CORONAVIRUS, NAA: SARS-CoV-2, NAA: NOT DETECTED

## 2019-12-11 ENCOUNTER — Other Ambulatory Visit: Payer: Self-pay | Admitting: Family Medicine

## 2019-12-17 ENCOUNTER — Other Ambulatory Visit: Payer: Self-pay

## 2019-12-18 ENCOUNTER — Ambulatory Visit (INDEPENDENT_AMBULATORY_CARE_PROVIDER_SITE_OTHER): Payer: 59 | Admitting: Family Medicine

## 2019-12-18 ENCOUNTER — Encounter: Payer: Self-pay | Admitting: Family Medicine

## 2019-12-18 VITALS — BP 117/78 | HR 78 | Temp 98.2°F | Resp 16 | Ht 59.0 in | Wt 172.8 lb

## 2019-12-18 DIAGNOSIS — H00015 Hordeolum externum left lower eyelid: Secondary | ICD-10-CM

## 2019-12-18 MED ORDER — ERYTHROMYCIN 5 MG/GM OP OINT: 1.0000 "application " | TOPICAL_OINTMENT | Freq: Three times a day (TID) | OPHTHALMIC | 1 refills | Status: DC

## 2019-12-18 NOTE — Progress Notes (Signed)
CC:  Eye stye - red lower left eyelid  HPI: erythematous, approx 1 mm,s tender, itchy been on and off in the same location left lower eyelid for the last two months; warm compress has helped reduce in size but recurs on and off; has stopped wearing eye makeup as this has helped reduce the number of times it gets inflamed; no vision changes  PMH: had stye in same spot with similar sxs years ago  M/A: see chart  FH: DM in brother and mother (both now deceased) ; see chart  SH : two daughters - one in [place] is Research officer, trade union, one in [place] is a Public affairs consultant; she is married; she works as a Education administrator for [employer] ; [employer], not enough physical activity according to her and plans to see a doctor for this concern specifically  ROS: no vision changes, loss of appetite, fever, nausea, vomiting, chills, cough, sore throat, rhinorrhea   PE:  HEENT: Red, inflamed, tender lesion on left lower eyelid about 1 mm in size, at the base of the eyelashes, PERRLA .  No drainage or bulbar conjunctival injection.  Has other concerns -- weight loss, screening for cancers, full physical exam -- will address in another visit   GERD  Had manometry and Takes PPI  Weight loss  Wants to address Diet bc FH DM of mom and bro  infrequent drinker  Has a very healthy diet of fruits and vegetables   Recently had Colonoscopy  Last Mammography 1.5 year ago? Pap smear 1.5 yr ago ?   Assessment: Martha [name] is a 59 yo woman with a PMH of styes who presents today with a recurrent left lower hordeolum for the past two months.    Plan:  For her stye: topical erythromycin ointment tid to affected area  for 7-10d and warm compress with J&J no tears baby shampoo on towel daily. For her other health concerns: schedule another visit for a fill physical exam and routine screening   ADDENDUM: I have seen the patient, discussed with Myrle Sheng, agree with entire note above. Signed:  Santiago Bumpers, MD            12/18/2019

## 2019-12-18 NOTE — Progress Notes (Signed)
See note done by med student Aneesh Rahangdale.

## 2019-12-23 ENCOUNTER — Encounter (INDEPENDENT_AMBULATORY_CARE_PROVIDER_SITE_OTHER): Payer: Self-pay

## 2020-01-04 ENCOUNTER — Ambulatory Visit (INDEPENDENT_AMBULATORY_CARE_PROVIDER_SITE_OTHER): Payer: 59 | Admitting: Bariatrics

## 2020-01-04 ENCOUNTER — Encounter (INDEPENDENT_AMBULATORY_CARE_PROVIDER_SITE_OTHER): Payer: Self-pay | Admitting: Bariatrics

## 2020-01-04 ENCOUNTER — Other Ambulatory Visit: Payer: Self-pay

## 2020-01-04 VITALS — BP 144/86 | HR 77 | Temp 98.0°F | Ht 59.0 in | Wt 167.0 lb

## 2020-01-04 DIAGNOSIS — R5383 Other fatigue: Secondary | ICD-10-CM

## 2020-01-04 DIAGNOSIS — R0602 Shortness of breath: Secondary | ICD-10-CM | POA: Diagnosis not present

## 2020-01-04 DIAGNOSIS — Z9189 Other specified personal risk factors, not elsewhere classified: Secondary | ICD-10-CM

## 2020-01-04 DIAGNOSIS — E559 Vitamin D deficiency, unspecified: Secondary | ICD-10-CM

## 2020-01-04 DIAGNOSIS — Z1331 Encounter for screening for depression: Secondary | ICD-10-CM

## 2020-01-04 DIAGNOSIS — K219 Gastro-esophageal reflux disease without esophagitis: Secondary | ICD-10-CM | POA: Diagnosis not present

## 2020-01-04 DIAGNOSIS — E66811 Obesity, class 1: Secondary | ICD-10-CM

## 2020-01-04 DIAGNOSIS — E669 Obesity, unspecified: Secondary | ICD-10-CM

## 2020-01-04 DIAGNOSIS — Z6833 Body mass index (BMI) 33.0-33.9, adult: Secondary | ICD-10-CM

## 2020-01-04 DIAGNOSIS — Z0289 Encounter for other administrative examinations: Secondary | ICD-10-CM

## 2020-01-04 DIAGNOSIS — I10 Essential (primary) hypertension: Secondary | ICD-10-CM

## 2020-01-04 DIAGNOSIS — E6609 Other obesity due to excess calories: Secondary | ICD-10-CM

## 2020-01-04 DIAGNOSIS — D508 Other iron deficiency anemias: Secondary | ICD-10-CM

## 2020-01-04 NOTE — Progress Notes (Signed)
Chief Complaint:   OBESITY Martha [name] (MR# [id number]) is a 59 y.o. female who presents for evaluation and treatment of obesity and related comorbidities. Current BMI is Body mass index is 33.73 kg/m.[name] Kitchen Martha [name] has been struggling with her weight for many years and has been unsuccessful in either losing weight, maintaining weight loss, or reaching her healthy weight goal.  Martha [name] is currently in the action stage of change and ready to dedicate time achieving and maintaining a healthier weight. Martha [name] is interested in becoming our patient and working on intensive lifestyle modifications including (but not limited to) diet and exercise for weight loss.  Martha [name] likes to [employer], but struggles with time and eating late. She skips lunch.  Martha [name]'s habits were reviewed today and are as follows: Her family eats meals together, she thinks her family will eat healthier with her, her desired weight loss is 57 lbs, she started gaining weight after stressful job/stopped going to the gym, her heaviest weight ever was 167 pounds, she is a picky eater and doesn't like to eat healthier foods, she skips lunch frequently, she is frequently drinking liquids with calories and she has binge eating behaviors.  Depression Screen Martha [name]'s Food and Mood (modified PHQ-9) score was 14.  Depression screen [facility] 2/9 01/04/2020  Decreased Interest 3  Down, Depressed, Hopeless 3  PHQ - 2 Score 6  Altered sleeping 2  Tired, decreased energy 2  Change in appetite 0  Feeling bad or failure about yourself  1  Trouble concentrating 2  Moving slowly or fidgety/restless 0  Suicidal thoughts 0  PHQ-9 Score 13  Difficult doing work/chores Not difficult at all   Subjective:   Other fatigue. Martha [name] denies daytime somnolence and admits to waking up still tired. Martha [name] generally gets 6-7 hours of sleep per night, and states that she does not sleep well most nights. Snoring is sometimes present. Apneic episodes are not present.  Epworth Sleepiness Score is 1.  Shortness of breath on exertion. Martha [name] notes increasing shortness of breath with certain activities and seems to be worsening over time with weight gain. She notes getting out of breath sooner with activity than she used to. This has gotten worse recently. Martha [name] denies shortness of breath at rest or orthopnea.  Other iron deficiency anemia. Martha [name] is not on iron replacement. Anemia is chronic, borderline.   CBC Latest Ref Rng & Units 11/28/2016  WBC - 9.7  Platelets 150 - 399 351   No results found for: IRON, TIBC, FERRITIN No results found for: VITAMINB12  Gastroesophageal reflux disease without esophagitis. Martha [name] is taking Protonix. She has a history of a hiatal hernia, controlled with medication.  Vitamin D deficiency. Martha [name] is taking Vitamin D supplementation.  Essential hypertension. Martha [name] is taking lisinopril. Blood pressure is regularly well controlled, however, is slightly elevated today.  BP Readings from Last 3 Encounters:  01/04/20 (!) 144/86  12/18/19 117/78  09/11/19 130/80   Lab Results  Component Value Date   CREATININE 0.8 01/03/2018   CREATININE 0.9 07/08/2017   CREATININE 1.0 11/28/2016   Depression screening. Martha [name] had a moderately positive depression screen with a PHQ-9 score of 14.  At risk for heart disease. Martha [name] is at a higher than average risk for cardiovascular disease due to hypertension and obesity.   Assessment/[name]:   Other fatigue. Martha [name] does feel that her weight is causing her energy to be lower than it should be. Fatigue may be related to obesity, depression or many other causes. Labs  will be ordered, and in the meanwhile, Martha [name] will focus on self care including making healthy food choices, increasing physical activity and focusing on stress reduction. EKG 12-Lead, Comprehensive metabolic panel, Hemoglobin A1c, Insulin, random, Folate, T3, T4, free, TSH ordered.  Shortness of breath on exertion. Martha [name] does  feel that she gets out of breath more easily that she used to when she exercises. Martha [name]'s shortness of breath appears to be obesity related and exercise induced. She has agreed to work on weight loss and gradually increase exercise to treat her exercise induced shortness of breath. Will continue to monitor closely. Lipid Panel With LDL/HDL Ratio labs ordered.  Other iron deficiency anemia. Orders and follow up as documented in patient record. CBC with Differential/Platelet ordered.  Counseling . Iron is essential for our bodies to make red blood cells.  Reasons that someone may be deficient include: an iron-deficient diet (more likely in those following vegan or vegetarian diets), women with heavy menses, patients with GI disorders or poor absorption, patients that have had bariatric surgery, frequent blood donors, patients with cancer, and patients with heart disease.   [name] Kitchen An iron supplement has been recommended. This is found over-the-counter.   Martha [name] foods include dark leafy greens, red and white meats, eggs, seafood, and beans.   . Certain foods and drinks prevent your body from absorbing iron properly. Avoid eating these foods in the same meal as iron-rich foods or with iron supplements. These foods include: coffee, black tea, and red wine; milk, dairy products, and foods that are high in calcium; beans and soybeans; whole grains.  . Constipation can be a side effect of iron supplementation. Increased water and fiber intake are helpful. Water goal: > 2 liters/day. Fiber goal: > 25 grams/day.     Gastroesophageal reflux disease without esophagitis. Intensive lifestyle modifications are the first line treatment for this issue. We discussed several lifestyle modifications today and she will continue to work on diet, exercise and weight loss efforts. Orders and follow up as documented in patient record. Martha [name] will continue Protonix as directed.  Counseling . If a person has gastroesophageal  reflux disease (GERD), food and stomach acid move back up into the esophagus and cause symptoms or problems such as damage to the esophagus. . Anti-reflux measures include: raising the head of the bed, avoiding tight clothing or belts, avoiding eating late at night, not lying down shortly after mealtime, and achieving weight loss. . Avoid ASA, NSAID's, caffeine, alcohol, and tobacco.  . OTC Pepcid and/or Tums are often very helpful for as needed use.  [name] Kitchen However, for persisting chronic or daily symptoms, stronger medications like Omeprazole may be needed. . You may need to avoid foods and drinks such as: ? Coffee and tea (with or without caffeine). ? Drinks that contain alcohol. ? Energy drinks and sports drinks. ? Bubbly (carbonated) drinks or sodas. ? Chocolate and cocoa. ? Peppermint and mint flavorings. ? Garlic and onions. ? Horseradish. ? Spicy and acidic foods. These include peppers, chili powder, curry powder, vinegar, hot sauces, and BBQ sauce. ? Citrus fruit juices and citrus fruits, such as oranges, lemons, and limes. ? Tomato-based foods. These include red sauce, chili, salsa, and pizza with red sauce. ? Fried and fatty foods. These include donuts, french fries, potato chips, and high-fat dressings. ? High-fat meats. These include hot dogs, rib eye steak, sausage, ham, and bacon.  Vitamin D deficiency. Low Vitamin D level contributes to fatigue and are associated with obesity, breast, and colon  cancer. VITAMIN D 25 Hydroxy (Vit-D Deficiency, Fractures) level ordered.  Essential hypertension. Martha [name] is working on healthy weight loss and exercise to improve blood pressure control. We will watch for signs of hypotension as she continues her lifestyle modifications. She will continue to take lisinopril as directed.  Depression screening. Martha [name] had a positive depression screening. Depression is commonly associated with obesity and often results in emotional eating behaviors. We will  monitor this closely and work on CBT to help improve the non-hunger eating patterns. Referral to Psychology may be required if no improvement is seen as she continues in our clinic.  At risk for heart disease. Martha [name] was given approximately 15 minutes of coronary artery disease prevention counseling today. She is 59 y.o. female and has risk factors for heart disease including obesity. We discussed intensive lifestyle modifications today with an emphasis on specific weight loss instructions and strategies.   Repetitive spaced learning was employed today to elicit superior memory formation and behavioral change.  Class 1 obesity with serious comorbidity and body mass index (BMI) of 33.0 to 33.9 in adult, unspecified obesity type.  Martha [name] is currently in the action stage of change and her goal is to continue with weight loss efforts. I recommend Martha [name] as follows:  She has agreed to the Category 2 [name].  She will work on meal planning and will decrease meal skipping.  We independently reviewed with the patient labs from 08/21/2018 including BMP, lipids, A1c, and TSH.  Exercise goals: All adults should avoid inactivity. Some physical activity is better than none, and adults who participate in any amount of physical activity gain some health benefits.   Behavioral modification strategies: increasing lean protein intake, decreasing simple carbohydrates, increasing vegetables, increasing water intake, decreasing liquid calories, decreasing eating out, no skipping meals, meal planning and cooking strategies, keeping healthy foods in the home, ways to avoid boredom eating, ways to avoid night time snacking, better snacking choices, emotional eating strategies and planning for success.  She was informed of the importance of frequent follow-up visits to maximize her success with intensive lifestyle modifications for her multiple health conditions. She was informed we  would discuss her lab results at her next visit unless there is a critical issue that needs to be addressed sooner. Martha [name] agreed to keep her next visit at the agreed upon time to discuss these results.  Objective:   Blood pressure (!) 144/86, pulse 77, temperature 98 F (36.7 C), height 4\' 11"  (1.499 m), weight 167 lb (75.8 kg), SpO2 97 %. Body mass index is 33.73 kg/m.  EKG: Sinus  Rhythm with a rate of 76 BPM. Right bundle branch block. Otherwise normal.  Indirect Calorimeter completed today shows a VO2 of 235 and a REE of 1634.  Her calculated basal metabolic rate is thus her basal metabolic rate is better than expected.  General: Cooperative, alert, well developed, in no acute distress. HEENT: Conjunctivae and lids unremarkable. Cardiovascular: Regular rhythm.  Lungs: Normal work of breathing. Neurologic: No focal deficits.   Lab Results  Component Value Date   CREATININE 0.8 01/03/2018   BUN 10 08/11/2018   NA 148 (A) 08/11/2018   K 4.8 08/11/2018   Lab Results  Component Value Date   ALT 22 08/11/2018   AST 20 08/11/2018   ALKPHOS 111 08/11/2018   Lab Results  Component Value Date   HGBA1C 5.1 08/11/2018   HGBA1C 6.1 01/03/2018   HGBA1C 6.1 07/08/2017   HGBA1C 5.8  11/28/2016   HGBA1C 6.1 05/03/2016   No results found for: INSULIN Lab Results  Component Value Date   TSH 0.64 08/11/2018   Lab Results  Component Value Date   CHOL 197 08/11/2018   HDL 55 08/11/2018   LDLCALC 121 08/11/2018   TRIG 105 08/11/2018   Lab Results  Component Value Date   WBC 9.7 11/28/2016   PLT 351 11/28/2016   No results found for: IRON, TIBC, FERRITIN  Attestation Statements:   Reviewed by clinician on day of visit: allergies, medications, problem list, medical history, surgical history, family history, social history, and previous encounter notes.  Fernanda Drum, am acting as Energy manager for [employer], DO   I have reviewed the above documentation for  accuracy and completeness, and I agree with the above. Corinna Capra, DO

## 2020-01-05 ENCOUNTER — Encounter (INDEPENDENT_AMBULATORY_CARE_PROVIDER_SITE_OTHER): Payer: Self-pay | Admitting: Bariatrics

## 2020-01-05 DIAGNOSIS — E559 Vitamin D deficiency, unspecified: Secondary | ICD-10-CM | POA: Insufficient documentation

## 2020-01-05 DIAGNOSIS — R7303 Prediabetes: Secondary | ICD-10-CM | POA: Insufficient documentation

## 2020-01-05 LAB — LIPID PANEL WITH LDL/HDL RATIO
Cholesterol, Total: 195 mg/dL (ref 100–199)
HDL: 46 mg/dL (ref >39)
LDL Chol Calc (NIH): 122 mg/dL — ABNORMAL HIGH (ref 0–99)
LDL/HDL Ratio: 2.7 ratio (ref 0.0–3.2)
Triglycerides: 150 mg/dL — ABNORMAL HIGH (ref 0–149)
VLDL Cholesterol Cal: 27 mg/dL (ref 5–40)

## 2020-01-05 LAB — COMPREHENSIVE METABOLIC PANEL
ALT: 16 IU/L (ref 0–32)
AST: 21 IU/L (ref 0–40)
Albumin/Globulin Ratio: 1.7 (ref 1.2–2.2)
Albumin: 4.4 g/dL (ref 3.8–4.9)
Alkaline Phosphatase: 134 IU/L — ABNORMAL HIGH (ref 39–117)
BUN/Creatinine Ratio: 15 (ref 12–28)
BUN: 17 mg/dL (ref 8–27)
Bilirubin Total: <0.2 mg/dL (ref 0.0–1.2)
CO2: 23 mmol/L (ref 20–29)
Calcium: 9.3 mg/dL (ref 8.7–10.3)
Chloride: 104 mmol/L (ref 96–106)
Creatinine, Ser: 1.15 mg/dL — ABNORMAL HIGH (ref 0.57–1.00)
GFR calc Af Amer: 60 mL/min/{1.73_m2} (ref >59)
GFR calc non Af Amer: 52 mL/min/{1.73_m2} — ABNORMAL LOW (ref >59)
Globulin, Total: 2.6 g/dL (ref 1.5–4.5)
Glucose: 105 mg/dL — ABNORMAL HIGH (ref 65–99)
Potassium: 4.4 mmol/L (ref 3.5–5.2)
Sodium: 140 mmol/L (ref 134–144)
Total Protein: 7 g/dL (ref 6.0–8.5)

## 2020-01-05 LAB — HEMOGLOBIN A1C
Est. average glucose Bld gHb Est-mCnc: 131 mg/dL
Hgb A1c MFr Bld: 6.2 % — ABNORMAL HIGH (ref 4.8–5.6)

## 2020-01-05 LAB — INSULIN, RANDOM: INSULIN: 32.2 u[IU]/mL — ABNORMAL HIGH (ref 2.6–24.9)

## 2020-01-05 LAB — CBC WITH DIFFERENTIAL/PLATELET
Basophils Absolute: 0.1 10*3/uL (ref 0.0–0.2)
Basos: 1 %
EOS (ABSOLUTE): 0.1 10*3/uL (ref 0.0–0.4)
Eos: 2 %
Hematocrit: 40.9 % (ref 34.0–46.6)
Hemoglobin: 13.2 g/dL (ref 11.1–15.9)
Immature Grans (Abs): 0 10*3/uL (ref 0.0–0.1)
Immature Granulocytes: 0 %
Lymphocytes Absolute: 1.9 10*3/uL (ref 0.7–3.1)
Lymphs: 26 %
MCH: 26.3 pg — ABNORMAL LOW (ref 26.6–33.0)
MCHC: 32.3 g/dL (ref 31.5–35.7)
MCV: 82 fL (ref 79–97)
Monocytes Absolute: 0.7 10*3/uL (ref 0.1–0.9)
Monocytes: 9 %
Neutrophils Absolute: 4.5 10*3/uL (ref 1.4–7.0)
Neutrophils: 62 %
Platelets: 317 10*3/uL (ref 150–450)
RBC: 5.01 x10E6/uL (ref 3.77–5.28)
RDW: 14.1 % (ref 11.7–15.4)
WBC: 7.3 10*3/uL (ref 3.4–10.8)

## 2020-01-05 LAB — TSH: TSH: 2.23 u[IU]/mL (ref 0.450–4.500)

## 2020-01-05 LAB — VITAMIN D 25 HYDROXY (VIT D DEFICIENCY, FRACTURES): Vit D, 25-Hydroxy: 22.9 ng/mL — ABNORMAL LOW (ref 30.0–100.0)

## 2020-01-05 LAB — FOLATE: Folate: 4.6 ng/mL (ref >3.0)

## 2020-01-05 LAB — VITAMIN B12: Vitamin B-12: 438 pg/mL (ref 232–1245)

## 2020-01-05 LAB — T3: T3, Total: 130 ng/dL (ref 71–180)

## 2020-01-05 LAB — T4, FREE: Free T4: 1.15 ng/dL (ref 0.82–1.77)

## 2020-01-18 ENCOUNTER — Ambulatory Visit (INDEPENDENT_AMBULATORY_CARE_PROVIDER_SITE_OTHER): Payer: 59 | Admitting: Bariatrics

## 2020-01-18 ENCOUNTER — Encounter (INDEPENDENT_AMBULATORY_CARE_PROVIDER_SITE_OTHER): Payer: Self-pay | Admitting: Bariatrics

## 2020-01-18 ENCOUNTER — Other Ambulatory Visit: Payer: Self-pay

## 2020-01-18 VITALS — BP 138/83 | HR 67 | Temp 97.9°F | Ht 59.0 in | Wt 165.0 lb

## 2020-01-18 DIAGNOSIS — E6609 Other obesity due to excess calories: Secondary | ICD-10-CM

## 2020-01-18 DIAGNOSIS — I10 Essential (primary) hypertension: Secondary | ICD-10-CM | POA: Diagnosis not present

## 2020-01-18 DIAGNOSIS — Z9189 Other specified personal risk factors, not elsewhere classified: Secondary | ICD-10-CM

## 2020-01-18 DIAGNOSIS — R7303 Prediabetes: Secondary | ICD-10-CM | POA: Diagnosis not present

## 2020-01-18 DIAGNOSIS — E559 Vitamin D deficiency, unspecified: Secondary | ICD-10-CM | POA: Diagnosis not present

## 2020-01-18 DIAGNOSIS — Z6833 Body mass index (BMI) 33.0-33.9, adult: Secondary | ICD-10-CM

## 2020-01-18 NOTE — Progress Notes (Signed)
Chief Complaint:   OBESITY Martha [name] is here to discuss her progress with her obesity treatment plan along with follow-up of her obesity related diagnoses. Martha [name] is on the Category 2 Plan x1 week and states she is following her eating plan approximately 90% of the time. Martha [name] states she is doing Zumba 60 minutes 1 time per week and yoga 60 minutes 1 time per week.  Today's visit was #: 2 Starting weight: 167 lbs Starting date: 01/04/2020 Today's weight: 165 lbs Today's date: 01/18/2020 Total lbs lost to date: 2 Total lbs lost since last in-office visit: 2  Interim History: Martha [name] is down 2 lbs.  Subjective:   Vitamin D deficiency. Martha [name] is taking Vitamin D OTC. Last Vitamin D 22.9 on 01/04/2020.  Chronic hypertension. Blood pressure is reasonably well controlled.  BP Readings from Last 3 Encounters:  01/18/20 138/83  01/04/20 (!) 144/86  12/18/19 117/78   Lab Results  Component Value Date   CREATININE 1.15 (H) 01/04/2020   CREATININE 0.8 01/03/2018   CREATININE 0.9 07/08/2017   Prediabetes. Martha [name] has a diagnosis of prediabetes based on her elevated HgA1c and was informed this puts her at greater risk of developing diabetes. She continues to work on diet and exercise to decrease her risk of diabetes. She denies nausea or hypoglycemia.  Lab Results  Component Value Date   HGBA1C 6.2 (H) 01/04/2020   Lab Results  Component Value Date   INSULIN 32.2 (H) 01/04/2020   At risk for diabetes mellitus. Martha [name] is at higher than average risk for developing diabetes due to her obesity.   Assessment/Plan:   Vitamin D deficiency. Low Vitamin D level contributes to fatigue and are associated with obesity, breast, and colon cancer. She agrees to continue to take Vitamin D and will follow-up for routine testing of Vitamin D, at least 2-3 times per year to avoid over-replacement.  Chronic hypertension. [place] is working on healthy weight loss and exercise to improve blood  pressure control. We will watch for signs of hypotension as she continues her lifestyle modifications. She will continue her medication as directed.  Prediabetes. Martha [name] will continue to work on weight loss, exercise, increasing healthy fats and protein, and decreasing simple carbohydrates to help decrease the risk of diabetes. Handout was given on Prediabetes and Insulin Resistance.  At risk for diabetes mellitus. Martha [name] was given approximately 15 minutes of diabetes education and counseling today. We discussed intensive lifestyle modifications today with an emphasis on weight loss as well as increasing exercise and decreasing simple carbohydrates in her diet. We also reviewed medication options with an emphasis on risk versus benefit of those discussed.   Repetitive spaced learning was employed today to elicit superior memory formation and behavioral change.  Class 1 obesity due to excess calories without serious comorbidity with body mass index (BMI) of 33.0 to 33.9 in adult.  Martha [name] is currently in the action stage of change. As such, her goal is to continue with weight loss efforts. She has agreed to the Category 2 Plan.   She will work on meal planning, mindless eating, and increasing her water intake.  Exercise goals: Martha [name] will continue her exercise/walking and increase over time.  Behavioral modification strategies: increasing lean protein intake, decreasing simple carbohydrates, increasing vegetables, increasing water intake, decreasing eating out, no skipping meals, meal planning and cooking strategies, keeping healthy foods in the home and planning for success.  Martha [name] has agreed to follow-up with our clinic in 4 weeks. She was  informed of the importance of frequent follow-up visits to maximize her success with intensive lifestyle modifications for her multiple health conditions.   Objective:   Blood pressure 138/83, pulse 67, temperature 97.9 F (36.6 C), height 4\' 11"  (1.499 m),  weight 165 lb (74.8 kg), SpO2 99 %. Body mass index is 33.33 kg/m.  General: Cooperative, alert, well developed, in no acute distress. HEENT: Conjunctivae and lids unremarkable. Cardiovascular: Regular rhythm.  Lungs: Normal work of breathing. Neurologic: No focal deficits.   Lab Results  Component Value Date   CREATININE 1.15 (H) 01/04/2020   BUN 17 01/04/2020   NA 140 01/04/2020   K 4.4 01/04/2020   CL 104 01/04/2020   CO2 23 01/04/2020   Lab Results  Component Value Date   ALT 16 01/04/2020   AST 21 01/04/2020   ALKPHOS 134 (H) 01/04/2020   BILITOT <0.2 01/04/2020   Lab Results  Component Value Date   HGBA1C 6.2 (H) 01/04/2020   HGBA1C 5.1 08/11/2018   HGBA1C 6.1 01/03/2018   HGBA1C 6.1 07/08/2017   HGBA1C 5.8 11/28/2016   Lab Results  Component Value Date   INSULIN 32.2 (H) 01/04/2020   Lab Results  Component Value Date   TSH 2.230 01/04/2020   Lab Results  Component Value Date   CHOL 195 01/04/2020   HDL 46 01/04/2020   LDLCALC 122 (H) 01/04/2020   TRIG 150 (H) 01/04/2020   Lab Results  Component Value Date   WBC 7.3 01/04/2020   HGB 13.2 01/04/2020   HCT 40.9 01/04/2020   MCV 82 01/04/2020   PLT 317 01/04/2020   No results found for: IRON, TIBC, FERRITIN  Attestation Statements:   Reviewed by clinician on day of visit: allergies, medications, problem list, medical history, surgical history, family history, social history, and previous encounter notes.  Time spent on visit including pre-visit chart review and post-visit charting and care was 30 minutes.   Migdalia Dk, am acting as Location manager for [employer], DO   I have reviewed the above documentation for accuracy and completeness, and I agree with the above. Jearld Lesch, DO

## 2020-01-19 ENCOUNTER — Ambulatory Visit (INDEPENDENT_AMBULATORY_CARE_PROVIDER_SITE_OTHER): Payer: 59 | Admitting: Bariatrics

## 2020-02-10 ENCOUNTER — Other Ambulatory Visit: Payer: Self-pay

## 2020-02-10 ENCOUNTER — Ambulatory Visit (INDEPENDENT_AMBULATORY_CARE_PROVIDER_SITE_OTHER): Payer: 59 | Admitting: Family Medicine

## 2020-02-10 ENCOUNTER — Encounter: Payer: Self-pay | Admitting: Family Medicine

## 2020-02-10 VITALS — BP 122/80 | HR 76 | Temp 97.7°F | Resp 16 | Ht 59.0 in | Wt 168.0 lb

## 2020-02-10 DIAGNOSIS — H00015 Hordeolum externum left lower eyelid: Secondary | ICD-10-CM | POA: Diagnosis not present

## 2020-02-10 MED ORDER — ERYTHROMYCIN 5 MG/GM OP OINT: 1.0000 "application " | TOPICAL_OINTMENT | Freq: Three times a day (TID) | OPHTHALMIC | 4 refills | Status: AC

## 2020-02-10 NOTE — Progress Notes (Signed)
OFFICE VISIT  02/10/2020   CC:  Chief Complaint  Patient presents with  . Cyst on eye    x4 months, followed PCP recommendations but has not gone away   HPI:    Patient is a 59 y.o. Caucasian female who presents for "cyst on eye".  I saw her 12/18/19 for hordeolum L eye lower eyelid and recommended baby shampoo warm compress + e-mycin ointment. L lower lid punctate bump at base of eyelash unchanged despite regular use of warm baby shampoo compresses and 1 course of e-mycin ointment. No itching or pain.  NO generalized eyelid swelling and no erythema.  She sees a little bump defect in visual field sometimes.     Past Medical History:  Diagnosis Date  . Allergic rhinitis   . Anemia   . Arthralgia of multiple sites    Gen rheum lab w/u normal/neg 11/2016 by prior PCP  . Back pain   . Constipation   . GERD (gastroesophageal reflux disease)    +grade A esophagitis 06/2019 EGD.  Bx neg for eosinophilic esoph. 08/2019 esoph manometry normal, pH probe + abnl reflux not fully suppressed by PPI. PPI BID + H2 blocker as of 08/2019.  [name] Kitchen History of rectal bleeding    remote past->? rectal ulcer vs perf'd divertic-->need old records for clarif.  [name] Kitchen Hypercholesterolemia    Past hx of statin use, then was able to come off meds when she lost wt  . Hypertension   . Insomnia   . Joint pain   . Palpitations   . Prediabetes 2017   Old PCP records state only that her Hba1c was 6.1% in 2017: she took metformin x ? 6-12 mo, rx'd by wt loss MD.  A1c 6.1% Nov 2019.  . Right hip pain    MRI showed labral tear, got steroid injection    Past Surgical History:  Procedure Laterality Date  . 24 HOUR PH STUDY N/A 08/05/2019   +GERD. Procedure: 24 HOUR PH STUDY;  Surgeon: Tressia Danas, MD;  Location: [facility];  Service: Gastroenterology;  Laterality: N/A;  . ABDOMINAL HYSTERECTOMY  2005   for benign dx (No BSO)  . BREAST BIOPSY Right 2005  . CESAREAN SECTION     x 2  . CHOLECYSTECTOMY  2011  .  COLONOSCOPY  most recent 01/2012   x 2->approx 2011 was done for BRBPR, unclear dx (rectal ulcer? perf'd diverticulum?).  Another about 2015 for abd pain w/u?  No polyps detected on either colonoscopy.  Need old records for clarification (GI MD was Dr. Thedore Mins in [place])--prior pcp records say "colonoscopy 01/2012 negative".  . ESOPHAGEAL MANOMETRY N/A 08/05/2019   NORMAL Procedure: ESOPHAGEAL MANOMETRY (EM);  Surgeon: Tressia Danas, MD;  Location: [facility];  Service: Gastroenterology;  Laterality: N/A;  . ESOPHAGOGASTRODUODENOSCOPY  06/2019     06/2019->reflux esophagitis, o/w normal.  . TONSILLECTOMY AND ADENOIDECTOMY  2009    Outpatient Medications Prior to Visit  Medication Sig Dispense Refill  . Ascorbic Acid (VITAMIN C) 1000 MG tablet Take 1,000 mg by mouth daily.     . cholecalciferol (VITAMIN D) 25 MCG (1000 UT) tablet Vitamin D3 25 mcg (1,000 unit) capsule   1 capsule every day by oral route.    . fluticasone (FLONASE) 50 MCG/ACT nasal spray Place 2 sprays into both nostrils daily. 16 g 6  . levocetirizine (XYZAL) 5 MG tablet Take 5 mg by mouth daily.     [name] Kitchen lisinopril (ZESTRIL) 5 MG tablet Take 1 tablet (5 mg  total) by mouth daily. 90 tablet 3  . meloxicam (MOBIC) 15 MG tablet Take 1 tablet (15 mg total) by mouth daily as needed for pain. 90 tablet 3  . montelukast (SINGULAIR) 10 MG tablet Take 1 tablet (10 mg total) by mouth at bedtime. 90 tablet 3  . Omega-3 Fatty Acids (OMEGA-3 FISH OIL PO) Take 1 capsule by mouth daily.     . pantoprazole (PROTONIX) 40 MG tablet TAKE 1 TABLET BY MOUTH TWICE A DAY 180 tablet 0  . erythromycin ophthalmic ointment Place 1 application into the left eye 3 (three) times daily. (Patient not taking: Reported on 02/10/2020) 1 g 1  . neomycin-polymyxin-dexameth (MAXITROL) 0.1 % OINT 1 application 3 (three) times daily.     No facility-administered medications prior to visit.    Allergies  Allergen Reactions  . Codeine Anaphylaxis    ROS As per  HPI  PE: Blood pressure 122/80, pulse 76, temperature 97.7 F (36.5 C), temperature source Temporal, resp. rate 16, height 4\' 11"  (1.499 m), weight 168 lb (76.2 kg), SpO2 99 %. Body mass index is 33.93 kg/m.  L eye with 1 mm punctate pink papule on the underside of L lower lid at the base of eyelash. EOMI.  No bulbar or conjunctival injection.  PERRL.  LABS:  Lab Results  Component Value Date   HGBA1C 6.2 (H) 01/04/2020     Chemistry      Component Value Date/Time   NA 140 01/04/2020 1158   K 4.4 01/04/2020 1158   CL 104 01/04/2020 1158   CO2 23 01/04/2020 1158   BUN 17 01/04/2020 1158   CREATININE 1.15 (H) 01/04/2020 1158   GLU 91 01/03/2018 0000      Component Value Date/Time   CALCIUM 9.3 01/04/2020 1158   ALKPHOS 134 (H) 01/04/2020 1158   AST 21 01/04/2020 1158   ALT 16 01/04/2020 1158   BILITOT <0.2 01/04/2020 1158     Lab Results  Component Value Date   TSH 2.230 01/04/2020    IMPRESSION AND PLAN:  Persistent hordeolum: no imp with 10d course of e-mycin ointment and regular baby shampoo warm compresses.  No worse, essentially asymptomatic.  No worrisome features to suggest alternative dx. Offered ophtho referral vs treat with e-mycin gel tid x 33mo and continue warm water baby shampoo compress daily.  Pt opted for 1 mo treatment and no ophtho ref at this time. Call if probs, e-mycin ointment sent to pharmacy.  An After Visit Summary was printed and given to the patient.  FOLLOW UP: No follow-ups on file.  Signed:  Crissie Sickles, MD           02/10/2020

## 2020-02-15 ENCOUNTER — Other Ambulatory Visit: Payer: Self-pay

## 2020-02-15 ENCOUNTER — Ambulatory Visit (INDEPENDENT_AMBULATORY_CARE_PROVIDER_SITE_OTHER): Payer: 59 | Admitting: Bariatrics

## 2020-02-15 ENCOUNTER — Encounter (INDEPENDENT_AMBULATORY_CARE_PROVIDER_SITE_OTHER): Payer: Self-pay | Admitting: Bariatrics

## 2020-02-15 VITALS — BP 141/84 | HR 82 | Temp 98.1°F | Ht 59.0 in | Wt 163.0 lb

## 2020-02-15 DIAGNOSIS — I10 Essential (primary) hypertension: Secondary | ICD-10-CM

## 2020-02-15 DIAGNOSIS — Z6833 Body mass index (BMI) 33.0-33.9, adult: Secondary | ICD-10-CM

## 2020-02-15 DIAGNOSIS — E559 Vitamin D deficiency, unspecified: Secondary | ICD-10-CM | POA: Diagnosis not present

## 2020-02-15 DIAGNOSIS — K219 Gastro-esophageal reflux disease without esophagitis: Secondary | ICD-10-CM

## 2020-02-15 DIAGNOSIS — E6609 Other obesity due to excess calories: Secondary | ICD-10-CM

## 2020-02-15 DIAGNOSIS — Z9189 Other specified personal risk factors, not elsewhere classified: Secondary | ICD-10-CM

## 2020-02-15 MED ORDER — VITAMIN D (ERGOCALCIFEROL) 1.25 MG (50000 UNIT) PO CAPS: 50000.0000 [IU] | ORAL_CAPSULE | ORAL | 0 refills | Status: DC

## 2020-02-15 NOTE — Progress Notes (Signed)
Chief Complaint:   OBESITY Martha [name] is here to discuss her progress with her obesity treatment plan along with follow-up of her obesity related diagnoses. Martha [name] is on the Category 2 Plan and states she is following her eating plan approximately 90% of the time. Martha [name] states she is doing Zumba 60 minutes 2 times per week and working with weights 35 minutes 1 time per week.  Today's visit was #: 3 Starting weight: 167 lbs Starting date: 01/04/2020 Today's weight: 163 lbs Today's date: 02/15/2020 Total lbs lost to date: 4 Total lbs lost since last in-office visit: 2  Interim History: Martha [name] is down 2 lbs. She has been at home. She reports doing well with her water intake.  Subjective:   Gastroesophageal reflux disease without esophagitis. Martha [name] is taking Protonix.  Essential hypertension. Martha [name] is taking Zestril. Blood pressure is reasonably well controlled.  BP Readings from Last 3 Encounters:  02/15/20 (!) 141/84  02/10/20 122/80  01/18/20 138/83   Lab Results  Component Value Date   CREATININE 1.15 (H) 01/04/2020   CREATININE 0.8 01/03/2018   CREATININE 0.9 07/08/2017   Vitamin D deficiency. Martha [name] is on OTC Vitamin D. Last Vitamin D 22.9 on 01/04/2020.  At risk for osteoporosis. Martha [name] is at higher risk of osteopenia and osteoporosis due to Vitamin D deficiency.   Assessment/Plan:   Gastroesophageal reflux disease without esophagitis. Intensive lifestyle modifications are the first line treatment for this issue. We discussed several lifestyle modifications today and she will continue to work on diet, exercise and weight loss efforts. Orders and follow up as documented in patient record. Meggen will continue Protonix and will avoid triggers.  Counseling . If a person has gastroesophageal reflux disease (GERD), food and stomach acid move back up into the esophagus and cause symptoms or problems such as damage to the esophagus. . Anti-reflux measures include:  raising the head of the bed, avoiding tight clothing or belts, avoiding eating late at night, not lying down shortly after mealtime, and achieving weight loss. . Avoid ASA, NSAID's, caffeine, alcohol, and tobacco.  . OTC Pepcid and/or Tums are often very helpful for as needed use.  [name] Kitchen However, for persisting chronic or daily symptoms, stronger medications like Omeprazole may be needed. . You may need to avoid foods and drinks such as: ? Coffee and tea (with or without caffeine). ? Drinks that contain alcohol. ? Energy drinks and sports drinks. ? Bubbly (carbonated) drinks or sodas. ? Chocolate and cocoa. ? Peppermint and mint flavorings. ? Garlic and onions. ? Horseradish. ? Spicy and acidic foods. These include peppers, chili powder, curry powder, vinegar, hot sauces, and BBQ sauce. ? Citrus fruit juices and citrus fruits, such as oranges, lemons, and limes. ? Tomato-based foods. These include red sauce, chili, salsa, and pizza with red sauce. ? Fried and fatty foods. These include donuts, french fries, potato chips, and high-fat dressings. ? High-fat meats. These include hot dogs, rib eye steak, sausage, ham, and bacon.  Essential hypertension. Martha [name] is working on healthy weight loss and exercise to improve blood pressure control. We will watch for signs of hypotension as she continues her lifestyle modifications. She will continue her medication as directed.  Vitamin D deficiency. Low Vitamin D level contributes to fatigue and are associated with obesity, breast, and colon cancer. She was given a prescription for Vitamin D, Ergocalciferol, (DRISDOL) 1.25 MG (50000 UNIT) CAPS capsule every week #4 with 0 refills and will follow-up for routine testing of Vitamin D,  at least 2-3 times per year to avoid over-replacement.    At risk for osteoporosis. Martha [name] was given approximately 15 minutes of osteoporosis prevention counseling today. Martha [name] is at risk for osteopenia and osteoporosis due to her  Vitamin D deficiency. She was encouraged to take her Vitamin D and follow her higher calcium diet and increase strengthening exercise to help strengthen her bones and decrease her risk of osteopenia and osteoporosis.  Repetitive spaced learning was employed today to elicit superior memory formation and behavioral change.  Class 1 obesity due to excess calories without serious comorbidity with body mass index (BMI) of 33.0 to 33.9 in adult.  Martha [name] is currently in the action stage of change. As such, her goal is to continue with weight loss efforts. She has agreed to the Category 2 Plan.   She will work on meal planning, intentional eating, and increasing her water intake.  Exercise goals: Martha [name] has been active and will continue.  Behavioral modification strategies: increasing lean protein intake, decreasing simple carbohydrates, increasing vegetables, increasing water intake, decreasing eating out, no skipping meals, meal planning and cooking strategies and keeping healthy foods in the home.  Martha [name] has agreed to follow-up with our clinic in 2 weeks. She was informed of the importance of frequent follow-up visits to maximize her success with intensive lifestyle modifications for her multiple health conditions.   Objective:   Blood pressure (!) 141/84, pulse 82, temperature 98.1 F (36.7 C), height 4\' 11"  (1.499 m), weight 163 lb (73.9 kg), SpO2 99 %. Body mass index is 32.92 kg/m.  General: Cooperative, alert, well developed, in no acute distress. HEENT: Conjunctivae and lids unremarkable. Cardiovascular: Regular rhythm.  Lungs: Normal work of breathing. Neurologic: No focal deficits.   Lab Results  Component Value Date   CREATININE 1.15 (H) 01/04/2020   BUN 17 01/04/2020   NA 140 01/04/2020   K 4.4 01/04/2020   CL 104 01/04/2020   CO2 23 01/04/2020   Lab Results  Component Value Date   ALT 16 01/04/2020   AST 21 01/04/2020   ALKPHOS 134 (H) 01/04/2020   BILITOT <0.2  01/04/2020   Lab Results  Component Value Date   HGBA1C 6.2 (H) 01/04/2020   HGBA1C 5.1 08/11/2018   HGBA1C 6.1 01/03/2018   HGBA1C 6.1 07/08/2017   HGBA1C 5.8 11/28/2016   Lab Results  Component Value Date   INSULIN 32.2 (H) 01/04/2020   Lab Results  Component Value Date   TSH 2.230 01/04/2020   Lab Results  Component Value Date   CHOL 195 01/04/2020   HDL 46 01/04/2020   LDLCALC 122 (H) 01/04/2020   TRIG 150 (H) 01/04/2020   Lab Results  Component Value Date   WBC 7.3 01/04/2020   HGB 13.2 01/04/2020   HCT 40.9 01/04/2020   MCV 82 01/04/2020   PLT 317 01/04/2020   No results found for: IRON, TIBC, FERRITIN  Attestation Statements:   Reviewed by clinician on day of visit: allergies, medications, problem list, medical history, surgical history, family history, social history, and previous encounter notes.  01/06/2020, am acting as Fernanda Drum for Energy manager, DO   I have reviewed the above documentation for accuracy and completeness, and I agree with the above. [employer], DO

## 2020-02-16 ENCOUNTER — Encounter (INDEPENDENT_AMBULATORY_CARE_PROVIDER_SITE_OTHER): Payer: Self-pay | Admitting: Bariatrics

## 2020-02-22 ENCOUNTER — Other Ambulatory Visit: Payer: Self-pay

## 2020-02-22 MED ORDER — PANTOPRAZOLE SODIUM 40 MG PO TBEC: 40.0000 mg | DELAYED_RELEASE_TABLET | Freq: Two times a day (BID) | ORAL | 0 refills | Status: DC

## 2020-02-24 ENCOUNTER — Other Ambulatory Visit: Payer: Self-pay

## 2020-02-24 MED ORDER — MELOXICAM 15 MG PO TABS: 15.0000 mg | ORAL_TABLET | Freq: Every day | ORAL | 0 refills | Status: DC | PRN

## 2020-02-24 MED ORDER — MONTELUKAST SODIUM 10 MG PO TABS: 10.0000 mg | ORAL_TABLET | Freq: Every day | ORAL | 1 refills | Status: DC

## 2020-02-24 MED ORDER — LISINOPRIL 5 MG PO TABS: 5.0000 mg | ORAL_TABLET | Freq: Every day | ORAL | 1 refills | Status: DC

## 2020-02-26 ENCOUNTER — Encounter (INDEPENDENT_AMBULATORY_CARE_PROVIDER_SITE_OTHER): Payer: Self-pay | Admitting: Bariatrics

## 2020-02-29 ENCOUNTER — Ambulatory Visit (INDEPENDENT_AMBULATORY_CARE_PROVIDER_SITE_OTHER): Payer: 59 | Admitting: Bariatrics

## 2020-03-02 ENCOUNTER — Telehealth: Payer: Self-pay

## 2020-03-02 NOTE — Telephone Encounter (Signed)
Patient called in stating insurance company is needing her Rx to filled with them now instead of [facility]    Reason for Discontinue   montelukast (SINGULAIR) 10 MG tablet   Reorder   lisinopril (ZESTRIL) 5 MG tablet   Reorder   meloxicam (MOBIC) 15 MG tablet   Reorder   [facility] [place], [place] - [address]

## 2020-03-02 NOTE — Telephone Encounter (Signed)
Patient contacted and advised all requested medications were sent thru mail order on 5/19.

## 2020-03-10 ENCOUNTER — Other Ambulatory Visit (INDEPENDENT_AMBULATORY_CARE_PROVIDER_SITE_OTHER): Payer: Self-pay | Admitting: Bariatrics

## 2020-03-10 DIAGNOSIS — E559 Vitamin D deficiency, unspecified: Secondary | ICD-10-CM

## 2020-03-16 ENCOUNTER — Other Ambulatory Visit: Payer: Self-pay | Admitting: Family Medicine

## 2020-05-08 DIAGNOSIS — M25511 Pain in right shoulder: Secondary | ICD-10-CM

## 2020-05-08 HISTORY — DX: Pain in right shoulder: M25.511

## 2020-05-10 ENCOUNTER — Other Ambulatory Visit: Payer: Self-pay

## 2020-05-10 ENCOUNTER — Emergency Department (INDEPENDENT_AMBULATORY_CARE_PROVIDER_SITE_OTHER)
Admission: EM | Admit: 2020-05-10 | Discharge: 2020-05-10 | Disposition: A | Discharge status: Home / Self Care | Payer: 59 | Source: Home / Self Care | Attending: Family Medicine | Admitting: Family Medicine

## 2020-05-10 ENCOUNTER — Telehealth: Payer: Self-pay | Admitting: Gastroenterology

## 2020-05-10 DIAGNOSIS — R1319 Other dysphagia: Secondary | ICD-10-CM

## 2020-05-10 DIAGNOSIS — R079 Chest pain, unspecified: Secondary | ICD-10-CM

## 2020-05-10 DIAGNOSIS — R197 Diarrhea, unspecified: Secondary | ICD-10-CM

## 2020-05-10 DIAGNOSIS — R1013 Epigastric pain: Secondary | ICD-10-CM | POA: Diagnosis not present

## 2020-05-10 DIAGNOSIS — R110 Nausea: Secondary | ICD-10-CM

## 2020-05-10 DIAGNOSIS — R1310 Dysphagia, unspecified: Secondary | ICD-10-CM

## 2020-05-10 MED ORDER — FAMOTIDINE 40 MG PO TABS: 40.0000 mg | ORAL_TABLET | Freq: Every day | ORAL | 1 refills | Status: DC

## 2020-05-10 MED ORDER — SUCRALFATE 1 GM/10ML PO SUSP: 1.0000 g | Freq: Four times a day (QID) | ORAL | 2 refills | Status: DC

## 2020-05-10 NOTE — Telephone Encounter (Signed)
Pt was seen at urgent care and given carafate and told to follow-up with GI. Pt scheduled to see Dr. Orvan Falconer 07/11/20@3 :20pm. Pt aware of appt.

## 2020-05-10 NOTE — ED Triage Notes (Signed)
Pt under care of GI doctor who rxd her pantoprazole BID last December. Last week had lunch and felt nauseous that night, burning in chest and throat, sour taste in mouth. Some diarrhea as well. Mylanta and rolaids prn with little relief. Has tried to get in with her GI doctor or PCP but no openings and advised to go to UC for eval.

## 2020-05-10 NOTE — ED Provider Notes (Signed)
[facility]    CSN: [id number] Arrival date & time: 05/10/20  1229      History   Chief Complaint Chief Complaint  Patient presents with  . Gastroesophageal Reflux    HPI Martha [name] is a 59 y.o. female.   Patient has a history of GERD, presently taking pantoprazole BID.  Five days ago after eating lunch, patient developed nausea without vomiting, and watery diarrhea.  That night she had persistent nausea, burning in chest, and sour taste in mouth.  She continues to have chest burning when swallowing, not improved with Rolaids and Mylanta.  The history is provided by the patient.    Past Medical History:  Diagnosis Date  . Allergic rhinitis   . Anemia   . Arthralgia of multiple sites    Gen rheum lab w/u normal/neg 11/2016 by prior PCP  . Back pain   . Constipation   . GERD (gastroesophageal reflux disease)    +grade A esophagitis 06/2019 EGD.  Bx neg for eosinophilic esoph. 08/2019 esoph manometry normal, pH probe + abnl reflux not fully suppressed by PPI. PPI BID + H2 blocker as of 08/2019.  [name] Kitchen History of rectal bleeding    remote past->? rectal ulcer vs perf'd divertic-->need old records for clarif.  [name] Kitchen Hypercholesterolemia    Past hx of statin use, then was able to come off meds when she lost wt  . Hypertension   . Insomnia   . Joint pain   . Palpitations   . Prediabetes 2017   Old PCP records state only that her Hba1c was 6.1% in 2017: she took metformin x ? 6-12 mo, rx'd by wt loss MD.  A1c 6.1% Nov 2019.  . Right hip pain    MRI showed labral tear, got steroid injection    Patient Active Problem List   Diagnosis Date Noted  . Prediabetes 01/05/2020  . Vitamin D insufficiency 01/05/2020  . Class 1 obesity due to excess calories without serious comorbidity with body mass index (BMI) of 33.0 to 33.9 in adult 01/04/2020  . HTN (hypertension) 01/04/2020  . Gastroesophageal reflux disease   . Hiatal hernia     Past Surgical History:    Procedure Laterality Date  . 24 HOUR PH STUDY N/A 08/05/2019   +GERD. Procedure: 24 HOUR PH STUDY;  Surgeon: Tressia Danas, MD;  Location: [facility];  Service: Gastroenterology;  Laterality: N/A;  . ABDOMINAL HYSTERECTOMY  2005   for benign dx (No BSO)  . BREAST BIOPSY Right 2005  . CESAREAN SECTION     x 2  . CHOLECYSTECTOMY  2011  . COLONOSCOPY  most recent 01/2012   x 2->approx 2011 was done for BRBPR, unclear dx (rectal ulcer? perf'd diverticulum?).  Another about 2015 for abd pain w/u?  No polyps detected on either colonoscopy.  Need old records for clarification (GI MD was Dr. Thedore Mins in [place])--prior pcp records say "colonoscopy 01/2012 negative".  . ESOPHAGEAL MANOMETRY N/A 08/05/2019   NORMAL Procedure: ESOPHAGEAL MANOMETRY (EM);  Surgeon: Tressia Danas, MD;  Location: [facility];  Service: Gastroenterology;  Laterality: N/A;  . ESOPHAGOGASTRODUODENOSCOPY  06/2019     06/2019->reflux esophagitis, o/w normal.  . TONSILLECTOMY AND ADENOIDECTOMY  2009    OB History    Gravida  2   Para  2   Term      Preterm      AB      Living        SAB  TAB      Ectopic      Multiple      Live Births               Home Medications    Prior to Admission medications   Medication Sig Start Date End Date Taking? Authorizing Provider  Ascorbic Acid (VITAMIN C) 1000 MG tablet Take 1,000 mg by mouth daily.  03/18/17   [provider]  cholecalciferol (VITAMIN D) 25 MCG (1000 UT) tablet Vitamin D3 25 mcg (1,000 unit) capsule   1 capsule every day by oral route. 03/18/17   [provider]  famotidine (PEPCID) 40 MG tablet Take 1 tablet (40 mg total) by mouth at bedtime. 05/10/20 05/10/21  Lattie Haw, MD  fluticasone (FLONASE) 50 MCG/ACT nasal spray Place 2 sprays into both nostrils daily. 04/01/19   McGowen, Maryjean Morn, MD  levocetirizine (XYZAL) 5 MG tablet Take 5 mg by mouth daily.     [provider]  lisinopril (ZESTRIL) 5 MG tablet  Take 1 tablet (5 mg total) by mouth daily. 02/24/20   McGowen, Maryjean Morn, MD  meloxicam (MOBIC) 15 MG tablet Take 1 tablet (15 mg total) by mouth daily as needed for pain. 02/24/20   McGowen, Maryjean Morn, MD  montelukast (SINGULAIR) 10 MG tablet Take 1 tablet (10 mg total) by mouth at bedtime. 02/24/20   McGowen, Maryjean Morn, MD  Omega-3 Fatty Acids (OMEGA-3 FISH OIL PO) Take 1 capsule by mouth daily.  03/18/17   [provider]  pantoprazole (PROTONIX) 40 MG tablet Take 1 tablet (40 mg total) by mouth 2 (two) times daily. 02/22/20   McGowen, Maryjean Morn, MD  sucralfate (CARAFATE) 1 GM/10ML suspension Take 10 mLs (1 g total) by mouth 4 (four) times daily. 05/10/20 05/10/21  Lattie Haw, MD  Vitamin D, Ergocalciferol, (DRISDOL) 1.25 MG (50000 UNIT) CAPS capsule Take 1 capsule (50,000 Units total) by mouth every 7 (seven) days. 02/15/20   Roswell Nickel, DO    Family History Family History  Problem Relation Age of Onset  . Diabetes Mother   . Alcohol abuse Mother   . High blood pressure Mother   . Rheumatic fever Father   . Heart attack Father   . Diabetes Brother   . Breast cancer Maternal Aunt   . Colon cancer Neg Hx   . Esophageal cancer Neg Hx   . Rectal cancer Neg Hx   . Stomach cancer Neg Hx     Social History Social History   Tobacco Use  . Smoking status: Never Smoker  . Smokeless tobacco: Never Used  Vaping Use  . Vaping Use: Never used  Substance Use Topics  . Alcohol use: Yes    Comment: wine occasional  . Drug use: Never     Allergies   Codeine   Review of Systems Review of Systems  Constitutional: Positive for appetite change. Negative for chills, diaphoresis, fatigue and fever.  HENT: Negative.   Eyes: Negative.   Respiratory: Negative.   Cardiovascular: Negative.   Gastrointestinal: Positive for abdominal pain, diarrhea and nausea. Negative for abdominal distention, blood in stool, constipation and vomiting.  Genitourinary: Negative.   Musculoskeletal:  Negative.   Skin: Negative.   Neurological: Negative.      Physical Exam Triage Vital Signs ED Triage Vitals  Enc Vitals Group     BP 05/10/20 1239 (!) 160/96     Pulse Rate 05/10/20 1239 86     Resp 05/10/20 1239 18  Temp 05/10/20 1239 98.7 F (37.1 C)     Temp Source 05/10/20 1239 Oral     SpO2 05/10/20 1239 98 %     Weight --      Height --      Head Circumference --      Peak Flow --      Pain Score 05/10/20 1240 2     Pain Loc --      Pain Edu? --      Excl. in GC? --    No data found.  Updated Vital Signs BP (!) 160/96 (BP Location: Right Arm)   Pulse 86   Temp 98.7 F (37.1 C) (Oral)   Resp 18   SpO2 98%   Visual Acuity Right Eye Distance:   Left Eye Distance:   Bilateral Distance:    Right Eye Near:   Left Eye Near:    Bilateral Near:     Physical Exam Nursing notes and Vital Signs reviewed. Appearance:  Patient appears stated age, and in no acute distress.    Eyes:  Pupils are equal, round, and reactive to light and accomodation.  Extraocular movement is intact.  Conjunctivae are not inflamed   Pharynx:  Normal; moist mucous membranes  Neck:  Supple.  No adenopathy Lungs:  Clear to auscultation.  Breath sounds are equal.  Moving air well. Heart:  Regular rate and rhythm without murmurs, rubs, or gallops.  Abdomen:  Nontender without masses or hepatosplenomegaly.  Bowel sounds are present.  No CVA or flank tenderness.  Extremities:  No edema.  Skin:  No rash present.     UC Treatments / Results  Labs (all labs ordered are listed, but only abnormal results are displayed) Labs Reviewed  COMPLETE METABOLIC PANEL WITH GFR  AMYLASE  LIPASE  POCT CBC W AUTO DIFF ([facility]):  WBC 8.8; LY 30.6; MO 3.8; GR 65.6; Hgb 13.3; Platelets 309     EKG  Rate:  80 BPM PR:  142 msec QT:  434 msec QTcH:  500 msec QRSD:  130 msec QRS axis:  69 degrees Interpretation:   RBBB; normal sinus rhythm   Review of past records:  EKG unchanged  from 01/04/20  Radiology No results found.  Procedures Procedures (including critical care time)  Medications Ordered in UC Medications - No data to display  Initial Impression / Assessment and Plan / UC Course  I have reviewed the triage vital signs and the nursing notes.  Pertinent labs & imaging results that were available during my care of the patient were reviewed by me and considered in my medical decision making (see chart for details).    Patient appears to have a rather severe exacerbation of her GERD.  Will check CMP, amylase, lipase. Add Carafate suspension, and Pepcid 40mg  HS. Followup with GI as scheduled. If symptoms become significantly worse during the night or over the weekend, proceed to the local emergency room.    Final Clinical Impressions(s) / UC Diagnoses   Final diagnoses:  Abdominal pain, epigastric  Nausea without vomiting  Diarrhea, unspecified type  Esophageal dysphagia     Discharge Instructions     Continue pantoprazole twice daily. If diarrhea recurs, try taking Pedialyte for about 12 to 18 hours until diarrhea stops, then switch to clear liquids (apple juice, clear grape juice, Jello, etc).  When improved, advance to a [employer] diet (Bananas, Rice, Applesauce, Toast). Then gradually resume a regular diet when tolerated.   If symptoms become significantly  worse during the night or over the weekend, proceed to the local emergency room.     ED Prescriptions    Medication Sig Dispense Auth. Provider   sucralfate (CARAFATE) 1 GM/10ML suspension Take 10 mLs (1 g total) by mouth 4 (four) times daily. 420 mL Lattie Haw, MD   famotidine (PEPCID) 40 MG tablet Take 1 tablet (40 mg total) by mouth at bedtime. 15 tablet Lattie Haw, MD        Lattie Haw, MD 05/14/20 2214

## 2020-05-10 NOTE — Telephone Encounter (Signed)
Left message for pt to call back  °

## 2020-05-10 NOTE — Discharge Instructions (Addendum)
Continue pantoprazole twice daily. If diarrhea recurs, try taking Pedialyte for about 12 to 18 hours until diarrhea stops, then switch to clear liquids (apple juice, clear grape juice, Jello, etc).  When improved, advance to a [employer] (Bananas, Rice, Applesauce, Toast). Then gradually resume a regular diet when tolerated.   If symptoms become significantly worse during the night or over the weekend, proceed to the local emergency room.

## 2020-05-11 LAB — COMPLETE METABOLIC PANEL WITH GFR
AG Ratio: 1.5 (calc) (ref 1.0–2.5)
ALT: 16 U/L (ref 6–29)
AST: 17 U/L (ref 10–35)
Albumin: 4.4 g/dL (ref 3.6–5.1)
Alkaline phosphatase (APISO): 111 U/L (ref 37–153)
BUN: 12 mg/dL (ref 7–25)
CO2: 26 mmol/L (ref 20–32)
Calcium: 9.5 mg/dL (ref 8.6–10.4)
Chloride: 105 mmol/L (ref 98–110)
Creat: 0.9 mg/dL (ref 0.50–0.99)
GFR, Est African American: 81 mL/min/{1.73_m2} (ref >=60)
GFR, Est Non African American: 69 mL/min/{1.73_m2} (ref >=60)
Globulin: 3 g/dL (calc) (ref 1.9–3.7)
Glucose, Bld: 105 mg/dL — ABNORMAL HIGH (ref 65–99)
Potassium: 4.7 mmol/L (ref 3.5–5.3)
Sodium: 139 mmol/L (ref 135–146)
Total Bilirubin: 0.5 mg/dL (ref 0.2–1.2)
Total Protein: 7.4 g/dL (ref 6.1–8.1)

## 2020-05-11 LAB — AMYLASE: Amylase: 32 U/L (ref 21–101)

## 2020-05-11 LAB — LIPASE: Lipase: 13 U/L (ref 7–60)

## 2020-05-16 ENCOUNTER — Encounter: Payer: Self-pay | Admitting: Family Medicine

## 2020-05-17 LAB — POCT CBC W AUTO DIFF (K'VILLE URGENT CARE)

## 2020-06-15 ENCOUNTER — Other Ambulatory Visit: Payer: Self-pay | Admitting: Family Medicine

## 2020-06-15 ENCOUNTER — Telehealth: Payer: Self-pay

## 2020-06-15 DIAGNOSIS — Z1231 Encounter for screening mammogram for malignant neoplasm of breast: Secondary | ICD-10-CM

## 2020-06-15 NOTE — Telephone Encounter (Signed)
Patient is requesting a GYN doctor. Does not need a referral order added in the system. Patient is asking "who would Dr. Milinda Cave tell me to go to for my GYN needs?"  She would like more than 1 so that she is able to choose.  There are so many Doctors in [place] and the [place] area, WHO can YOU trust?    If you will give me a few names, I can call her tomorrow.

## 2020-06-16 NOTE — Telephone Encounter (Signed)
Please follow up with patient and provide list of names below.

## 2020-06-16 NOTE — Telephone Encounter (Signed)
Dr. Marcelle Overlie Dr. Zelphia Cairo Dr. Maxie Better Dr. Harold Hedge Dr. Olivia Mackie

## 2020-06-16 NOTE — Telephone Encounter (Signed)
Please advise, thanks.

## 2020-06-17 NOTE — Telephone Encounter (Signed)
Left patient message to call back.

## 2020-06-27 ENCOUNTER — Encounter: Payer: Self-pay | Admitting: Family Medicine

## 2020-06-30 ENCOUNTER — Other Ambulatory Visit: Payer: Self-pay | Admitting: Family Medicine

## 2020-07-11 ENCOUNTER — Ambulatory Visit: Payer: 59 | Admitting: Gastroenterology

## 2020-08-23 ENCOUNTER — Ambulatory Visit: Payer: 59 | Admitting: Gastroenterology

## 2020-09-15 ENCOUNTER — Ambulatory Visit: Payer: 59

## 2020-10-04 ENCOUNTER — Other Ambulatory Visit: Payer: Self-pay | Admitting: Family Medicine

## 2020-10-04 NOTE — Telephone Encounter (Signed)
RF request for lisinopril, meloxicam, and montelukast LOV: 02/10/20 Next ov: n/a Last written: 5/10/17/19(90,1)  Please advise. Medications pending

## 2020-10-04 NOTE — Telephone Encounter (Signed)
Patient notified. She will call back after she has her mammogram done on 10/27/20

## 2020-10-04 NOTE — Telephone Encounter (Signed)
OK, RF's eRx'd today. Needs cpe with me in 3-6 months.-thx

## 2020-10-08 DIAGNOSIS — E119 Type 2 diabetes mellitus without complications: Secondary | ICD-10-CM

## 2020-10-08 HISTORY — DX: Type 2 diabetes mellitus without complications: E11.9

## 2020-10-26 ENCOUNTER — Ambulatory Visit: Payer: 59

## 2020-10-27 ENCOUNTER — Ambulatory Visit (INDEPENDENT_AMBULATORY_CARE_PROVIDER_SITE_OTHER): Payer: 59

## 2020-10-27 ENCOUNTER — Other Ambulatory Visit: Payer: Self-pay

## 2020-10-27 DIAGNOSIS — Z1231 Encounter for screening mammogram for malignant neoplasm of breast: Secondary | ICD-10-CM | POA: Diagnosis not present

## 2020-11-02 ENCOUNTER — Encounter: Payer: Self-pay | Admitting: *Deleted

## 2020-11-08 DIAGNOSIS — M5136 Other intervertebral disc degeneration, lumbar region: Secondary | ICD-10-CM

## 2020-11-08 DIAGNOSIS — M51369 Other intervertebral disc degeneration, lumbar region without mention of lumbar back pain or lower extremity pain: Secondary | ICD-10-CM

## 2020-11-08 HISTORY — DX: Other intervertebral disc degeneration, lumbar region without mention of lumbar back pain or lower extremity pain: M51.369

## 2020-11-08 HISTORY — DX: Other intervertebral disc degeneration, lumbar region: M51.36

## 2020-11-15 ENCOUNTER — Emergency Department (HOSPITAL_COMMUNITY)
Admission: EM | Admit: 2020-11-15 | Discharge: 2020-11-15 | Disposition: A | Discharge status: Home / Self Care | Payer: 59 | Attending: Emergency Medicine | Admitting: Emergency Medicine

## 2020-11-15 ENCOUNTER — Encounter (HOSPITAL_COMMUNITY): Payer: Self-pay

## 2020-11-15 DIAGNOSIS — I10 Essential (primary) hypertension: Secondary | ICD-10-CM | POA: Insufficient documentation

## 2020-11-15 DIAGNOSIS — M545 Low back pain, unspecified: Secondary | ICD-10-CM | POA: Insufficient documentation

## 2020-11-15 DIAGNOSIS — Z79899 Other long term (current) drug therapy: Secondary | ICD-10-CM | POA: Diagnosis not present

## 2020-11-15 LAB — URINALYSIS, ROUTINE W REFLEX MICROSCOPIC
Bilirubin Urine: NEGATIVE
Glucose, UA: NEGATIVE mg/dL
Hgb urine dipstick: NEGATIVE
Ketones, ur: NEGATIVE mg/dL
Leukocytes,Ua: NEGATIVE
Nitrite: NEGATIVE
Protein, ur: NEGATIVE mg/dL
Specific Gravity, Urine: 1.023 (ref 1.005–1.030)
pH: 5 (ref 5.0–8.0)

## 2020-11-15 MED ORDER — HYDROMORPHONE HCL 1 MG/ML IJ SOLN
1.0000 mg | Freq: Once | INTRAMUSCULAR | Status: AC
  Administered 2020-11-15: 1 mg via INTRAVENOUS
  Filled 2020-11-15: qty 1

## 2020-11-15 MED ORDER — DEXAMETHASONE SODIUM PHOSPHATE 10 MG/ML IJ SOLN
10.0000 mg | Freq: Once | INTRAMUSCULAR | Status: AC
  Administered 2020-11-15: 10 mg via INTRAVENOUS
  Filled 2020-11-15: qty 1

## 2020-11-15 MED ORDER — METHOCARBAMOL 500 MG PO TABS: 500.0000 mg | ORAL_TABLET | Freq: Three times a day (TID) | ORAL | 0 refills | Status: DC | PRN

## 2020-11-15 MED ORDER — KETOROLAC TROMETHAMINE 30 MG/ML IJ SOLN
30.0000 mg | Freq: Once | INTRAMUSCULAR | Status: AC
  Administered 2020-11-15: 30 mg via INTRAVENOUS
  Filled 2020-11-15: qty 1

## 2020-11-15 MED ORDER — METHOCARBAMOL 500 MG PO TABS
500.0000 mg | ORAL_TABLET | Freq: Once | ORAL | Status: AC
  Administered 2020-11-15: 500 mg via ORAL
  Filled 2020-11-15: qty 1

## 2020-11-15 NOTE — ED Triage Notes (Signed)
Patient brought in by husband.   Patient reports waking up this morning with right lower back pain and has progressed to sharp/shooting radiating pain down right leg.  10/10 pain   Patient denies injury, trauma, or falls.   A/Ox4

## 2020-11-15 NOTE — ED Notes (Signed)
ED Provider at bedside. 

## 2020-11-15 NOTE — Discharge Instructions (Signed)
The low back pain appears to be more musculoskeletal.  Take the muscle asked her to help.  The steroid should also help.  Motrin and Tylenol may help with the pain too.  Follow-up with your doctor as needed.  Watch for fevers or worsening pain.

## 2020-11-15 NOTE — ED Provider Notes (Signed)
[facility]-EMERGENCY DEPT Provider Note   CSN: [id number] Arrival date & time: 11/15/20  1129     History Chief Complaint  Patient presents with  . Back Pain  . Leg Pain    Martha [name] is a 60 y.o. female.  HPI Patient presents with severe back pain.  Has had some mild pain in the last few days but woke up with much more severe pain this morning.  Along the lower back particular right side that goes down the right leg.  Worse with movement of the right leg.  No dysuria or diarrhea or constipation.  No fevers or chills.  Has never had pain this severe before.  No abdominal pain with it.  No lightheadedness or dizziness.  No injury.  No loss of bladder or bowel control.    Past Medical History:  Diagnosis Date  . Allergic rhinitis   . Anemia   . Arthralgia of multiple sites    Gen rheum lab w/u normal/neg 11/2016 by prior PCP  . Back pain   . Constipation   . GERD (gastroesophageal reflux disease)    +grade A esophagitis 06/2019 EGD.  Bx neg for eosinophilic esoph. 08/2019 esoph manometry normal, pH probe + abnl reflux not fully suppressed by PPI. PPI BID + H2 blocker as of 08/2019.  [name] Kitchen History of rectal bleeding    remote past->? rectal ulcer vs perf'd divertic-->need old records for clarif.  [name] Kitchen Hypercholesterolemia    Past hx of statin use, then was able to come off meds when she lost wt  . Hypertension   . Insomnia   . Joint pain   . Palpitations   . Prediabetes 2017   Old PCP records state only that her Hba1c was 6.1% in 2017: she took metformin x ? 6-12 mo, rx'd by wt loss MD.  A1c 6.1% Nov 2019.  . Right hip pain    MRI showed labral tear, got steroid injection. Also troch bursitis.  . Right shoulder pain 05/2020   RC tear, bicipital pathology, AC arthrosis and impingement-->to have surgery    Patient Active Problem List   Diagnosis Date Noted  . Prediabetes 01/05/2020  . Vitamin D insufficiency 01/05/2020  . Class 1 obesity due to  excess calories without serious comorbidity with body mass index (BMI) of 33.0 to 33.9 in adult 01/04/2020  . HTN (hypertension) 01/04/2020  . Gastroesophageal reflux disease   . Hiatal hernia     Past Surgical History:  Procedure Laterality Date  . 24 HOUR PH STUDY N/A 08/05/2019   +GERD. Procedure: 24 HOUR PH STUDY;  Surgeon: Tressia Danas, MD;  Location: [facility];  Service: Gastroenterology;  Laterality: N/A;  . ABDOMINAL HYSTERECTOMY  2005   for benign dx (No BSO)  . BREAST BIOPSY Right 2005  . CESAREAN SECTION     x 2  . CHOLECYSTECTOMY  2011  . COLONOSCOPY  most recent 01/2012   x 2->approx 2011 was done for BRBPR, unclear dx (rectal ulcer? perf'd diverticulum?).  Another about 2015 for abd pain w/u?  No polyps detected on either colonoscopy.  Need old records for clarification (GI MD was Dr. Thedore M[place] in [place]prior pcp records say "colonoscopy 01/2012 negative".  . ESOPHAGEAL MANOMETRY N/A 08/05/2019   NORMAL Procedure: ESOPHAGEAL MANOMETRY (EM);  Surgeon: Tressia Danas, MD;  Location: [facility];  Service: Gastroenterology;  Laterality: N/A;  . ESOPHAGOGASTRODUODENOSCOPY  06/2019     06/2019->reflux esophagitis, o/w normal.  . TONSILLECTOMY AND ADENOIDECTOMY  2009  OB History    Gravida  2   Para  2   Term      Preterm      AB      Living        SAB      IAB      Ectopic      Multiple      Live Births              Family History  Problem Relation Age of Onset  . Diabetes Mother   . Alcohol abuse Mother   . High blood pressure Mother   . Rheumatic fever Father   . Heart attack Father   . Diabetes Brother   . Breast cancer Maternal Aunt   . Colon cancer Neg Hx   . Esophageal cancer Neg Hx   . Rectal cancer Neg Hx   . Stomach cancer Neg Hx     Social History   Tobacco Use  . Smoking status: Never Smoker  . Smokeless tobacco: Never Used  Vaping Use  . Vaping Use: Never used  Substance Use Topics  . Alcohol use: Yes     Comment: wine occasional  . Drug use: Never    Home Medications Prior to Admission medications   Medication Sig Start Date End Date Taking? Authorizing Provider  acetaminophen (TYLENOL) 500 MG tablet Take 1,000 mg by mouth every 6 (six) hours as needed for mild pain, fever or headache.   Yes [provider]  Camphor-Menthol-Methyl Sal (SALONPAS EX) Apply 1 patch topically as needed (pain).   Yes [provider]  celecoxib (CELEBREX) 100 MG capsule Take 100 mg by mouth 2 (two) times daily. 11/15/20  Yes [provider]  cholecalciferol (VITAMIN D) 25 MCG (1000 UT) tablet Take 1,000 Units by mouth daily. 03/18/17  Yes [provider]  levocetirizine (XYZAL) 5 MG tablet Take 5 mg by mouth daily.    Yes [provider]  lisinopril (ZESTRIL) 5 MG tablet TAKE 1 TABLET BY MOUTH  DAILY Patient taking differently: Take 5 mg by mouth daily. 10/04/20  Yes McGowen, Maryjean Morn, MD  methocarbamol (ROBAXIN) 500 MG tablet Take 1 tablet (500 mg total) by mouth every 8 (eight) hours as needed for muscle spasms. 11/15/20  Yes Benjiman Core, MD  montelukast (SINGULAIR) 10 MG tablet TAKE 1 TABLET BY MOUTH AT  BEDTIME Patient taking differently: Take 10 mg by mouth at bedtime. 10/04/20  Yes McGowen, Maryjean Morn, MD  Omega-3 Fatty Acids (OMEGA-3 FISH OIL PO) Take 1 capsule by mouth daily.  03/18/17  Yes [provider]  pantoprazole (PROTONIX) 40 MG tablet TAKE 1 TABLET BY MOUTH  TWICE DAILY Patient taking differently: Take 40 mg by mouth daily. 06/30/20  Yes McGowen, Maryjean Morn, MD  Probiotic Product (PROBIOTIC PO) Take 1 capsule by mouth daily.   Yes [provider]  sucralfate (CARAFATE) 1 GM/10ML suspension Take 10 mLs (1 g total) by mouth 4 (four) times daily. Patient taking differently: Take 1 g by mouth 4 (four) times daily as needed (acid reflux). 05/10/20 05/10/21 Yes Lattie Haw, MD  meloxicam (MOBIC) 15 MG tablet TAKE 1 TABLET BY MOUTH  DAILY AS NEEDED  FOR PAIN Patient not taking: Reported on 11/15/2020 10/04/20   Jeoffrey Massed, MD    Allergies    Codeine  Review of Systems   Review of Systems  Constitutional: Negative for appetite change and fever.  HENT: Negative for congestion.   Respiratory: Negative  for shortness of breath.   Cardiovascular: Negative for chest pain.  Gastrointestinal: Negative for abdominal pain.  Genitourinary: Negative for flank pain.  Musculoskeletal: Positive for back pain.  Skin: Negative for rash.  Neurological: Negative for weakness and numbness.  Psychiatric/Behavioral: Negative for confusion.    Physical Exam Updated Vital Signs BP (!) 149/76   Pulse 68   Temp (!) 97.5 F (36.4 C) (Oral)   Resp 18   SpO2 100%   Physical Exam Vitals and nursing note reviewed.  Constitutional:      Comments: Laying curled up on her left side in the bed.  HENT:     Head: Atraumatic.     Right Ear: External ear normal.     Left Ear: External ear normal.  Eyes:     General: No scleral icterus. Cardiovascular:     Rate and Rhythm: Regular rhythm.  Abdominal:     Tenderness: There is no abdominal tenderness.  Musculoskeletal:     Cervical back: Neck supple.     Comments: Tenderness over lumbar spine and paraspinal musculature.  No rash.  Good straight movement of right lower extremity.  No numbness or weakness.  Does have some dull tenderness over right lateral hip.  Skin:    Capillary Refill: Capillary refill takes less than 2 seconds.  Neurological:     Mental Status: She is alert and oriented to person, place, and time.     ED Results / Procedures / Treatments   Labs (all labs ordered are listed, but only abnormal results are displayed) Labs Reviewed  URINALYSIS, ROUTINE W REFLEX MICROSCOPIC    EKG None  Radiology No results found.  Procedures Procedures   Medications Ordered in ED Medications  HYDROmorphone (DILAUDID) injection 1 mg (has no administration in time range)   dexamethasone (DECADRON) injection 10 mg (has no administration in time range)  ketorolac (TORADOL) 30 MG/ML injection 30 mg (30 mg Intravenous Given 11/15/20 1228)  HYDROmorphone (DILAUDID) injection 1 mg (1 mg Intravenous Given 11/15/20 1229)  methocarbamol (ROBAXIN) tablet 500 mg (500 mg Oral Given 11/15/20 1417)    ED Course  I have reviewed the triage vital signs and the nursing notes.  Pertinent labs & imaging results that were available during my care of the patient were reviewed by me and considered in my medical decision making (see chart for details).    MDM Rules/Calculators/A&P                          Patient with low back pain/buttock pain.  On right.  Does have some radiation around to the hip.  Worse with certain movements positions but able to move the hip rather freely.  No fevers.  No abdominal pain.  No hematuria.  No trauma.  Patient feels somewhat better after treatment.  With benign abdominal exam I think less likely some pathology such as AAA as the cause.  No rash so shingles felt less likely although could be early in the rashes does not appear.  Will treat with steroids and muscle axis.  Also symptomatic treatment.  Outpatient follow-up with PCP as needed. Final Clinical Impression(s) / ED Diagnoses Final diagnoses:  Acute right-sided low back pain without sciatica    Rx / DC Orders ED Discharge Orders         Ordered    methocarbamol (ROBAXIN) 500 MG tablet  Every 8 hours PRN        11/15/20 1640  Benjiman Core, MD 11/15/20 [phone]

## 2020-11-16 ENCOUNTER — Telehealth: Payer: Self-pay | Admitting: Family Medicine

## 2020-11-16 NOTE — Telephone Encounter (Signed)
Spoke with patient and she is still using methocarbamol 500mg  q8h prn but not helping a lot. Last time taken was 4am this morning and at 6am she took 500mg  tylenol for more relief. No imaging was ordered while at ED and advised to follow with PCP to have this completed. She still c/o trouble with applying pressure to R leg, shooting pain from R side of spine to groin area with spasms off and on. Too much walking causes her R leg to be weak. No available appointments with PCP until Monday, 2/14 unless work-in slot created for this week.   Please advise, thanks.

## 2020-11-16 NOTE — Telephone Encounter (Signed)
Spoke with patient, advised of recommendations. Appt scheduled for 4pm in office tomorrow

## 2020-11-16 NOTE — Telephone Encounter (Signed)
Continue robaxin as rx'd. Apply heat to low back and buttock area 20 min at least twice a day. Offer in-person visit at 4 oclock tomorrow or Friday.

## 2020-11-16 NOTE — Telephone Encounter (Signed)
Patient's husband states she went to ED last night with spasms from her back and down her legs on right side. ED gave her pain meds and steroids and suggested she see PCP next day. No available appointments next three days. Please call patient to advise and schedule if possible. If no answer, call husband at [phone].

## 2020-11-17 ENCOUNTER — Other Ambulatory Visit: Payer: Self-pay

## 2020-11-17 ENCOUNTER — Encounter: Payer: Self-pay | Admitting: Family Medicine

## 2020-11-17 ENCOUNTER — Ambulatory Visit (INDEPENDENT_AMBULATORY_CARE_PROVIDER_SITE_OTHER): Payer: 59 | Admitting: Family Medicine

## 2020-11-17 ENCOUNTER — Encounter: Payer: Self-pay | Admitting: Obstetrics and Gynecology

## 2020-11-17 ENCOUNTER — Ambulatory Visit (INDEPENDENT_AMBULATORY_CARE_PROVIDER_SITE_OTHER): Payer: 59 | Admitting: Obstetrics and Gynecology

## 2020-11-17 VITALS — BP 166/82 | HR 73 | Temp 98.5°F | Ht 59.0 in | Wt 174.0 lb

## 2020-11-17 VITALS — BP 149/71 | HR 62 | Resp 16 | Ht 59.0 in | Wt 173.0 lb

## 2020-11-17 DIAGNOSIS — M7918 Myalgia, other site: Secondary | ICD-10-CM | POA: Diagnosis not present

## 2020-11-17 DIAGNOSIS — M25551 Pain in right hip: Secondary | ICD-10-CM | POA: Diagnosis not present

## 2020-11-17 DIAGNOSIS — Z01419 Encounter for gynecological examination (general) (routine) without abnormal findings: Secondary | ICD-10-CM

## 2020-11-17 MED ORDER — METHOCARBAMOL 500 MG PO TABS: ORAL_TABLET | ORAL | 1 refills | Status: DC

## 2020-11-17 MED ORDER — PREDNISONE 20 MG PO TABS: ORAL_TABLET | ORAL | 0 refills | Status: DC

## 2020-11-17 MED ORDER — TRAMADOL HCL 50 MG PO TABS: ORAL_TABLET | ORAL | 0 refills | Status: DC

## 2020-11-17 NOTE — Progress Notes (Signed)
OFFICE VISIT  11/17/2020  CC:  Chief Complaint  Patient presents with  . Back Pain    F/u from [facility] ED; back pain slightly improved   HPI:    Patient is a 60 y.o. Caucasian female who presents for right buttock and hip pain. She went to [facility] ED 11/15/20 and I reviewed encounter documentation today. No imaging was done.  She got dilaudid, decadron 10mg , and toradol and was improved and was d/c'd home on robaxin. She called next day b/c still struggling with pain and I had her put on my schedule for today.  HPI: Two mornings ago woke up with bad pain in R glut that was deep and rad around R thigh, into R groin down to level of the knee at times.  Pain with touching the area and with wt bearing.  Throbbing pain preventing her from sleeping.  Focal area on R lateral thigh/hip with numb/tingle sensation.  No leg weakness. Bowel/bladder function intact.  No saddle anesthesia.    She took a tramadol+ tylenol this morning and it helped signif and allowed 3 hrs sleep this morning.    Pt describes hx of IT band snapping in the past, got an injection and did PT.  Celebrex---daily for hx R shoulder pain.   Past Medical History:  Diagnosis Date  . Allergic rhinitis   . Anemia   . Arthralgia of multiple sites    Gen rheum lab w/u normal/neg 11/2016 by prior PCP  . Back pain   . Constipation   . GERD (gastroesophageal reflux disease)    +grade A esophagitis 06/2019 EGD.  Bx neg for eosinophilic esoph. 08/2019 esoph manometry normal, pH probe + abnl reflux not fully suppressed by PPI. PPI BID + H2 blocker as of 08/2019.  09/2019 History of rectal bleeding    remote past->? rectal ulcer vs perf'd divertic-->need old records for clarif.  [name] Kitchen Hypercholesterolemia    Past hx of statin use, then was able to come off meds when she lost wt  . Hypertension   . Insomnia   . Joint pain   . Palpitations   . Prediabetes 2017   Old PCP records state only that her Hba1c was 6.1% in 2017: she took metformin x ? 6-12  mo, rx'd by wt loss MD.  A1c 6.1% Nov 2019.  . Right hip pain    MRI showed labral tear, got steroid injection. Also troch bursitis.  . Right shoulder pain 05/2020   RC tear, bicipital pathology, AC arthrosis and impingement-->to have surgery    Past Surgical History:  Procedure Laterality Date  . 24 HOUR PH STUDY N/A 08/05/2019   +GERD. Procedure: 24 HOUR PH STUDY;  Surgeon: 08/07/2019, MD;  Location: [facility];  Service: Gastroenterology;  Laterality: N/A;  . ABDOMINAL HYSTERECTOMY  2005   for benign dx (No BSO)  . BREAST BIOPSY Right 2005  . CESAREAN SECTION     x 2  . CHOLECYSTECTOMY  2011  . COLONOSCOPY  most recent 01/2012   x 2->approx 2011 was done for BRBPR, unclear dx (rectal ulcer? perf'd diverticulum?).  Another about 2015 for abd pain w/u?  No polyps detected on either colonoscopy.  Need old records for clarification (GI MD was Dr. 2012 in [place])--prior pcp records say "colonoscopy 01/2012 negative".  . ESOPHAGEAL MANOMETRY N/A 08/05/2019   NORMAL Procedure: ESOPHAGEAL MANOMETRY (EM);  Surgeon: 08/07/2019, MD;  Location: [facility];  Service: Gastroenterology;  Laterality: N/A;  . ESOPHAGOGASTRODUODENOSCOPY  06/2019  06/2019->reflux esophagitis, o/w normal.  . TONSILLECTOMY AND ADENOIDECTOMY  2009    Outpatient Medications Prior to Visit  Medication Sig Dispense Refill  . acetaminophen (TYLENOL) 500 MG tablet Take 1,000 mg by mouth every 6 (six) hours as needed for mild pain, fever or headache.    . Camphor-Menthol-Methyl Sal (SALONPAS EX) Apply 1 patch topically as needed (pain).    . celecoxib (CELEBREX) 100 MG capsule Take 100 mg by mouth 2 (two) times daily.    . cholecalciferol (VITAMIN D) 25 MCG (1000 UT) tablet Take 1,000 Units by mouth daily.    [name] Kitchen levocetirizine (XYZAL) 5 MG tablet Take 5 mg by mouth daily.     [name] Kitchen lisinopril (ZESTRIL) 5 MG tablet TAKE 1 TABLET BY MOUTH  DAILY (Patient taking differently: Take 5 mg by mouth daily.) 90 tablet 3  .  meloxicam (MOBIC) 15 MG tablet TAKE 1 TABLET BY MOUTH  DAILY AS NEEDED FOR PAIN 90 tablet 3  . montelukast (SINGULAIR) 10 MG tablet TAKE 1 TABLET BY MOUTH AT  BEDTIME (Patient taking differently: Take 10 mg by mouth at bedtime.) 90 tablet 3  . Omega-3 Fatty Acids (OMEGA-3 FISH OIL PO) Take 1 capsule by mouth daily.     . pantoprazole (PROTONIX) 40 MG tablet TAKE 1 TABLET BY MOUTH  TWICE DAILY (Patient taking differently: Take 40 mg by mouth daily.) 180 tablet 3  . Probiotic Product (PROBIOTIC PO) Take 1 capsule by mouth daily.    . methocarbamol (ROBAXIN) 500 MG tablet Take 1 tablet (500 mg total) by mouth every 8 (eight) hours as needed for muscle spasms. 8 tablet 0  . traMADol (ULTRAM) 50 MG tablet Take by mouth every 6 (six) hours as needed.     No facility-administered medications prior to visit.    Allergies  Allergen Reactions  . Codeine Anaphylaxis    ROS As per HPI  PE: Vitals with BMI 11/17/2020 11/17/2020 11/15/2020  Height 4\' 11"  4\' 11"  -  Weight 174 lbs 173 lbs -  BMI 35.13 34.92 -  Systolic 166 149  Diastolic 82 71 72  Pulse 73 62 68     Gen: Alert, appears in mild acute distress from pain.  Patient is oriented to person, place, time, and situation. AFFECT: pleasant, lucid thought and speech. +Limps. R LB w/out tenderness. No tenderness of ischial tuberosity region or over glut in general on EITHER side. She has signif TTP over lateral aspect of R hip starting at greater troch region extending 1/2 way down thigh.  +TTP in inguinal region of R hip joint as well. Hard to tell what actually makes her pain worse b/c seemingly her pain is constant.  ROM of R hip intact. Supine SLR testing neg. LE strength 5/5 prox/dist.  LABS:    Chemistry      Component Value Date/Time   NA 139 05/10/2020 1427   NA 140 01/04/2020 1158   K 4.7 05/10/2020 1427   CL 105 05/10/2020 1427   CO2 26 05/10/2020 1427   BUN 12 05/10/2020 1427   BUN 17 01/04/2020 1158   CREATININE 0.90  05/10/2020 1427   GLU 91 01/03/2018 0000      Component Value Date/Time   CALCIUM 9.5 05/10/2020 1427   ALKPHOS 134 (H) 01/04/2020 1158   AST 17 05/10/2020 1427   ALT 16 05/10/2020 1427   BILITOT 0.5 05/10/2020 1427   BILITOT <0.2 01/04/2020 1158      IMPRESSION AND PLAN:  Acute R hip pain  w/out preceding strain or trauma or overuse. Etiology unclear. Has orthopedist, Dr. Everardo Pacific with [facility] in [place]. We've arranged for her to be seen there on 11/21/20. In the meantime I'll get plain films of R hip. Also prednisone 40mg  qd x 5d, tramadol 50-100mg  q6h prn, and continue methocarbamol 500-1000mg  tid prn. Hold celebrex while taking prednisone.  An After Visit Summary was printed and given to the patient.  FOLLOW UP: Return for as needed.  Signed:  , MD           11/17/2020

## 2020-11-17 NOTE — Progress Notes (Signed)
Subjective:     Martha [name] is a 60 y.o. female P2 with BMI 34 who is here for a comprehensive physical exam. The patient reports no problems. Patient had a hysterectomy due to AUB in 2015. She denies any pelvic pain or abnormal discharge. Patient is sexually active without complaints. She denies any urinary incontinence. Patient is without any complaints. She moved a year ago from [place] to assist with caring for her grandchildren.  Past Medical History:  Diagnosis Date  . Allergic rhinitis   . Anemia   . Arthralgia of multiple sites    Gen rheum lab w/u normal/neg 11/2016 by prior PCP  . Back pain   . Constipation   . GERD (gastroesophageal reflux disease)    +grade A esophagitis 06/2019 EGD.  Bx neg for eosinophilic esoph. 08/2019 esoph manometry normal, pH probe + abnl reflux not fully suppressed by PPI. PPI BID + H2 blocker as of 08/2019.  [name] Kitchen History of rectal bleeding    remote past->? rectal ulcer vs perf'd divertic-->need old records for clarif.  [name] Kitchen Hypercholesterolemia    Past hx of statin use, then was able to come off meds when she lost wt  . Hypertension   . Insomnia   . Joint pain   . Palpitations   . Prediabetes 2017   Old PCP records state only that her Hba1c was 6.1% in 2017: she took metformin x ? 6-12 mo, rx'd by wt loss MD.  A1c 6.1% Nov 2019.  . Right hip pain    MRI showed labral tear, got steroid injection. Also troch bursitis.  . Right shoulder pain 05/2020   RC tear, bicipital pathology, AC arthrosis and impingement-->to have surgery   Past Surgical History:  Procedure Laterality Date  . 24 HOUR PH STUDY N/A 08/05/2019   +GERD. Procedure: 24 HOUR PH STUDY;  Surgeon: Tressia Danas, MD;  Location: [facility];  Service: Gastroenterology;  Laterality: N/A;  . ABDOMINAL HYSTERECTOMY  2005   for benign dx (No BSO)  . BREAST BIOPSY Right 2005  . CESAREAN SECTION     x 2  . CHOLECYSTECTOMY  2011  . COLONOSCOPY  most recent 01/2012   x 2->approx  2011 was done for BRBPR, unclear dx (rectal ulcer? perf'd diverticulum?).  Another about 2015 for abd pain w/u?  No polyps detected on either colonoscopy.  Need old records for clarification (GI MD was Dr. Thedore Mins in [place])--prior pcp records say "colonoscopy 01/2012 negative".  . ESOPHAGEAL MANOMETRY N/A 08/05/2019   NORMAL Procedure: ESOPHAGEAL MANOMETRY (EM);  Surgeon: Tressia Danas, MD;  Location: [facility];  Service: Gastroenterology;  Laterality: N/A;  . ESOPHAGOGASTRODUODENOSCOPY  06/2019     06/2019->reflux esophagitis, o/w normal.  . TONSILLECTOMY AND ADENOIDECTOMY  2009   Family History  Problem Relation Age of Onset  . Diabetes Mother   . Alcohol abuse Mother   . High blood pressure Mother   . Rheumatic fever Father   . Heart attack Father   . Diabetes Brother   . Breast cancer Maternal Aunt   . Colon cancer Neg Hx   . Esophageal cancer Neg Hx   . Rectal cancer Neg Hx   . Stomach cancer Neg Hx     Social History   Socioeconomic History  . Marital status: Married    Spouse name: Elmyra Ricks  . Number of children: 2  . Years of education: Not on file  . Highest education level: Not on file  Occupational History  . Occupation: Remote  worker, Chartered loss adjuster (travel required)  Tobacco Use  . Smoking status: Never Smoker  . Smokeless tobacco: Never Used  Vaping Use  . Vaping Use: Never used  Substance and Sexual Activity  . Alcohol use: Yes    Comment: wine occasional  . Drug use: Never  . Sexual activity: Not on file  Other Topics Concern  . Not on file  Social History Narrative   Married, 2 daughters.   Moved to [facility] from [facility] 2019 to be closer to daughter and grandchildren.   Educ: College in [place].     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   In [place], she works from home for [employer].   Social Determinants of Health   Financial Resource Strain: Not on file  Food Insecurity: Not on file  Transportation Needs: Not on file   Physical Activity: Not on file  Stress: Not on file  Social Connections: Not on file  Intimate Partner Violence: Not on file   Health Maintenance  Topic Date Due  . Hepatitis C Screening  Never done  . HIV Screening  Never done  . INFLUENZA VACCINE  05/08/2020  . PAP SMEAR-Modifier  03/18/2021  . MAMMOGRAM  10/27/2022  . COLONOSCOPY (Pts 45-57yrs Insurance coverage will need to be confirmed)  10/08/2025  . TETANUS/TDAP  10/08/2026  . COVID-19 Vaccine  Completed       Review of Systems Pertinent items noted in HPI and remainder of comprehensive ROS otherwise negative.   Objective:  Blood pressure (!) 149/71, pulse 62, resp. rate 16, height 4\' 11"  (1.499 m), weight 173 lb (78.5 kg).     GENERAL: Well-developed, well-nourished female in no acute distress.  HEENT: Normocephalic, atraumatic. Sclerae anicteric.  NECK: Supple. Normal thyroid.  LUNGS: Clear to auscultation bilaterally.  HEART: Regular rate and rhythm. BREASTS: Symmetric in size. No palpable masses or lymphadenopathy, skin changes, or nipple drainage. ABDOMEN: Soft, nontender, nondistended. No organomegaly. PELVIC: Normal external female genitalia. Vagina is pink and rugated.  Normal discharge. Normal appearing vaginal cuff. No adnexal mass or tenderness. EXTREMITIES: No cyanosis, clubbing, or edema, 2+ distal pulses.    Assessment:    Healthy female exam.      Plan:    Pap smear not indicated as there is no cervix and no history of abnormal pap smear Patient with normal screening mammogram 10/2020 RTC prn See After Visit Summary for Counseling Recommendations

## 2020-11-18 ENCOUNTER — Ambulatory Visit (HOSPITAL_BASED_OUTPATIENT_CLINIC_OR_DEPARTMENT_OTHER)
Admission: RE | Admit: 2020-11-18 | Discharge: 2020-11-18 | Disposition: A | Discharge status: Home / Self Care | Payer: 59 | Source: Ambulatory Visit | Attending: Family Medicine | Admitting: Family Medicine

## 2020-11-18 DIAGNOSIS — M25551 Pain in right hip: Secondary | ICD-10-CM | POA: Insufficient documentation

## 2020-11-18 DIAGNOSIS — M7918 Myalgia, other site: Secondary | ICD-10-CM | POA: Insufficient documentation

## 2020-11-30 ENCOUNTER — Encounter: Payer: Self-pay | Admitting: Family Medicine

## 2021-01-02 ENCOUNTER — Other Ambulatory Visit: Payer: Self-pay

## 2021-01-03 ENCOUNTER — Ambulatory Visit (INDEPENDENT_AMBULATORY_CARE_PROVIDER_SITE_OTHER): Payer: 59 | Admitting: Family Medicine

## 2021-01-03 ENCOUNTER — Other Ambulatory Visit: Payer: Self-pay

## 2021-01-03 ENCOUNTER — Encounter: Payer: Self-pay | Admitting: Family Medicine

## 2021-01-03 VITALS — BP 146/83 | HR 97 | Temp 98.2°F | Ht 58.5 in | Wt 170.0 lb

## 2021-01-03 DIAGNOSIS — E7800 Pure hypercholesterolemia, unspecified: Secondary | ICD-10-CM | POA: Diagnosis not present

## 2021-01-03 DIAGNOSIS — Z0000 Encounter for general adult medical examination without abnormal findings: Secondary | ICD-10-CM | POA: Diagnosis not present

## 2021-01-03 DIAGNOSIS — K219 Gastro-esophageal reflux disease without esophagitis: Secondary | ICD-10-CM

## 2021-01-03 DIAGNOSIS — R7303 Prediabetes: Secondary | ICD-10-CM | POA: Diagnosis not present

## 2021-01-03 DIAGNOSIS — I10 Essential (primary) hypertension: Secondary | ICD-10-CM | POA: Diagnosis not present

## 2021-01-03 DIAGNOSIS — M5136 Other intervertebral disc degeneration, lumbar region: Secondary | ICD-10-CM

## 2021-01-03 LAB — LIPID PANEL
Cholesterol: 207 mg/dL — ABNORMAL HIGH (ref 0–200)
HDL: 52 mg/dL (ref >39.00)
LDL Cholesterol: 126 mg/dL — ABNORMAL HIGH (ref 0–99)
NonHDL: 154.89
Total CHOL/HDL Ratio: 4
Triglycerides: 146 mg/dL (ref 0.0–149.0)
VLDL: 29.2 mg/dL (ref 0.0–40.0)

## 2021-01-03 LAB — CBC WITH DIFFERENTIAL/PLATELET
Basophils Absolute: 0.1 10*3/uL (ref 0.0–0.1)
Basophils Relative: 0.8 % (ref 0.0–3.0)
Eosinophils Absolute: 0.1 10*3/uL (ref 0.0–0.7)
Eosinophils Relative: 2.4 % (ref 0.0–5.0)
HCT: 41.9 % (ref 36.0–46.0)
Hemoglobin: 13.4 g/dL (ref 12.0–15.0)
Lymphocytes Relative: 33.8 % (ref 12.0–46.0)
Lymphs Abs: 2.1 10*3/uL (ref 0.7–4.0)
MCHC: 32 g/dL (ref 30.0–36.0)
MCV: 81.7 fl (ref 78.0–100.0)
Monocytes Absolute: 0.5 10*3/uL (ref 0.1–1.0)
Monocytes Relative: 8 % (ref 3.0–12.0)
Neutro Abs: 3.4 10*3/uL (ref 1.4–7.7)
Neutrophils Relative %: 55 % (ref 43.0–77.0)
Platelets: 292 10*3/uL (ref 150.0–400.0)
RBC: 5.13 Mil/uL — ABNORMAL HIGH (ref 3.87–5.11)
RDW: 17.2 % — ABNORMAL HIGH (ref 11.5–15.5)
WBC: 6.1 10*3/uL (ref 4.0–10.5)

## 2021-01-03 LAB — COMPREHENSIVE METABOLIC PANEL
ALT: 36 U/L — ABNORMAL HIGH (ref 0–35)
AST: 32 U/L (ref 0–37)
Albumin: 4.5 g/dL (ref 3.5–5.2)
Alkaline Phosphatase: 99 U/L (ref 39–117)
BUN: 16 mg/dL (ref 6–23)
CO2: 26 mEq/L (ref 19–32)
Calcium: 9.8 mg/dL (ref 8.4–10.5)
Chloride: 104 mEq/L (ref 96–112)
Creatinine, Ser: 0.92 mg/dL (ref 0.40–1.20)
GFR: 67.17 mL/min (ref >60.00)
Glucose, Bld: 121 mg/dL — ABNORMAL HIGH (ref 70–99)
Potassium: 4.6 mEq/L (ref 3.5–5.1)
Sodium: 140 mEq/L (ref 135–145)
Total Bilirubin: 0.4 mg/dL (ref 0.2–1.2)
Total Protein: 7.4 g/dL (ref 6.0–8.3)

## 2021-01-03 LAB — HEMOGLOBIN A1C: Hgb A1c MFr Bld: 6.8 % — ABNORMAL HIGH (ref 4.6–6.5)

## 2021-01-03 LAB — TSH: TSH: 2.04 u[IU]/mL (ref 0.35–4.50)

## 2021-01-03 MED ORDER — LISINOPRIL 10 MG PO TABS: 10.0000 mg | ORAL_TABLET | Freq: Every day | ORAL | 0 refills | Status: DC

## 2021-01-03 NOTE — Patient Instructions (Signed)
Health Maintenance, Female Adopting a healthy lifestyle and getting preventive care are important in promoting health and wellness. Ask your health care provider about:  The right schedule for you to have regular tests and exams.  Things you can do on your own to prevent diseases and keep yourself healthy. What should I know about diet, weight, and exercise? Eat a healthy diet  Eat a diet that includes plenty of vegetables, fruits, low-fat dairy products, and lean protein.  Do not eat a lot of foods that are high in solid fats, added sugars, or sodium.   Maintain a healthy weight Body mass index (BMI) is used to identify weight problems. It estimates body fat based on height and weight. Your health care provider can help determine your BMI and help you achieve or maintain a healthy weight. Get regular exercise Get regular exercise. This is one of the most important things you can do for your health. Most adults should:  Exercise for at least 150 minutes each week. The exercise should increase your heart rate and make you sweat (moderate-intensity exercise).  Do strengthening exercises at least twice a week. This is in addition to the moderate-intensity exercise.  Spend less time sitting. Even light physical activity can be beneficial. Watch cholesterol and blood lipids Have your blood tested for lipids and cholesterol at 62 years of age, then have this test every 5 years. Have your cholesterol levels checked more often if:  Your lipid or cholesterol levels are high.  You are older than 62 years of age.  You are at high risk for heart disease. What should I know about cancer screening? Depending on your health history and family history, you may need to have cancer screening at various ages. This may include screening for:  Breast cancer.  Cervical cancer.  Colorectal cancer.  Skin cancer.  Lung cancer. What should I know about heart disease, diabetes, and high blood  pressure? Blood pressure and heart disease  High blood pressure causes heart disease and increases the risk of stroke. This is more likely to develop in people who have high blood pressure readings, are of African descent, or are overweight.  Have your blood pressure checked: ? Every 3-5 years if you are 18-39 years of age. ? Every year if you are 40 years old or older. Diabetes Have regular diabetes screenings. This checks your fasting blood sugar level. Have the screening done:  Once every three years after age 40 if you are at a normal weight and have a low risk for diabetes.  More often and at a younger age if you are overweight or have a high risk for diabetes. What should I know about preventing infection? Hepatitis B If you have a higher risk for hepatitis B, you should be screened for this virus. Talk with your health care provider to find out if you are at risk for hepatitis B infection. Hepatitis C Testing is recommended for:  Everyone born from 1945 through 1965.  Anyone with known risk factors for hepatitis C. Sexually transmitted infections (STIs)  Get screened for STIs, including gonorrhea and chlamydia, if: ? You are sexually active and are younger than 62 years of age. ? You are older than 62 years of age and your health care provider tells you that you are at risk for this type of infection. ? Your sexual activity has changed since you were last screened, and you are at increased risk for chlamydia or gonorrhea. Ask your health care provider   if you are at risk.  Ask your health care provider about whether you are at high risk for HIV. Your health care provider may recommend a prescription medicine to help prevent HIV infection. If you choose to take medicine to prevent HIV, you should first get tested for HIV. You should then be tested every 3 months for as long as you are taking the medicine. Pregnancy  If you are about to stop having your period (premenopausal) and  you may become pregnant, seek counseling before you get pregnant.  Take 400 to 800 micrograms (mcg) of folic acid every day if you become pregnant.  Ask for birth control (contraception) if you want to prevent pregnancy. Osteoporosis and menopause Osteoporosis is a disease in which the bones lose minerals and strength with aging. This can result in bone fractures. If you are 65 years old or older, or if you are at risk for osteoporosis and fractures, ask your health care provider if you should:  Be screened for bone loss.  Take a calcium or vitamin D supplement to lower your risk of fractures.  Be given hormone replacement therapy (HRT) to treat symptoms of menopause. Follow these instructions at home: Lifestyle  Do not use any products that contain nicotine or tobacco, such as cigarettes, e-cigarettes, and chewing tobacco. If you need help quitting, ask your health care provider.  Do not use street drugs.  Do not share needles.  Ask your health care provider for help if you need support or information about quitting drugs. Alcohol use  Do not drink alcohol if: ? Your health care provider tells you not to drink. ? You are pregnant, may be pregnant, or are planning to become pregnant.  If you drink alcohol: ? Limit how much you use to 0-1 drink a day. ? Limit intake if you are breastfeeding.  Be aware of how much alcohol is in your drink. In the [place] one drink equals one 12 oz bottle of beer (355 mL), one 5 oz glass of wine (148 mL), or one 1 oz glass of hard liquor (44 mL). General instructions  Schedule regular health, dental, and eye exams.  Stay current with your vaccines.  Tell your health care provider if: ? You often feel depressed. ? You have ever been abused or do not feel safe at home. Summary  Adopting a healthy lifestyle and getting preventive care are important in promoting health and wellness.  Follow your health care provider's instructions about healthy  diet, exercising, and getting tested or screened for diseases.  Follow your health care provider's instructions on monitoring your cholesterol and blood pressure. This information is not intended to replace advice given to you by your health care provider. Make sure you discuss any questions you have with your health care provider. Document Revised: 09/17/2018 Document Reviewed: 09/17/2018 Elsevier Patient Education  2021 Elsevier Inc.  

## 2021-01-03 NOTE — Progress Notes (Signed)
Office Note 01/03/2021  CC:  Chief Complaint  Patient presents with  . Annual Exam    Pt is fasting    HPI:  Martha [name] is a 60 y.o. female who is here for annual health maintenance exam and f/u HTN, HLD, GERD, prediabetes.  Update on her acute R hip pain that I saw her for back on 11/17/20-->she saw ortho and they got MRI L spine and it showed right foraminal disc protrusion at L3-4 with mod to severe neural foraminal stenosis.  It also showed left lateral recess and lef neural foraminal stenosis at L4-5 due to a left foraminal disc protrusion as well as severe left neural foraminal stenosis at L5-S1 d/t a disc protrusion. Currently on celebrex 100mg  bid. She decided to get ESI and it helped alleviate her pain significantly.  She walks 25 min daily, recently did a 1 mile hike this weekend. She is doing formal PT 1x/week. Plan for gradual wean off celebrex.  Also taking gabapentin 300mg  qhs.  HTN: lisinopril 5mg  qd Usually home bp is >130/80 last 3-4 wks.  It remains up a bit despite being in [facility] less pain.  HLD: no statin, but hx of being on one, lost wt and was able to d/c this med in the past.  GERD: was on pantoprazole 40mg  bid but says it did not help.  She then stopped the med completely and has avoided various culprit foods and this has helped very well.  Prediab: trying to eat low carb diet  Past Medical History:  Diagnosis Date  . Allergic rhinitis   . Anemia   . Arthralgia of multiple sites    Gen rheum lab w/u normal/neg 11/2016 by prior PCP  . Constipation   . DDD (degenerative disc disease), lumbar 11/2020   R glut/hip pain->ortho, MRI->disc causing pinched nerve at L3-4 level on R, pt will be getting [facility] 12/05/20.  12/2016 GERD (gastroesophageal reflux disease)    +grade A esophagitis 06/2019 EGD.  Bx neg for eosinophilic esoph. 08/2019 esoph manometry normal, pH probe + abnl reflux not fully suppressed by PPI. PPI BID + H2 blocker as of 08/2019.  [name] Kitchen History of  rectal bleeding    remote past->? rectal ulcer vs perf'd divertic-->need old records for clarif.  07/2019 Hypercholesterolemia    Past hx of statin use, then was able to come off meds when she lost wt  . Hypertension   . Insomnia   . Joint pain   . Palpitations   . Prediabetes 2017   Old PCP records state only that her Hba1c was 6.1% in 2017: she took metformin x ? 6-12 mo, rx'd by wt loss MD.  A1c 6.1% Nov 2019.  . Right hip pain    MRI showed labral tear, got steroid injection. Also troch bursitis.  . Right shoulder pain 05/2020   RC tear, bicipital pathology, AC arthrosis and impingement-->to have surgery    Past Surgical History:  Procedure Laterality Date  . 24 HOUR PH STUDY N/A 08/05/2019   +GERD. Procedure: 24 HOUR PH STUDY;  Surgeon: 2018, MD;  Location: [facility];  Service: Gastroenterology;  Laterality: N/A;  . ABDOMINAL HYSTERECTOMY  2005   for benign dx (No BSO)  . BREAST BIOPSY Right 2005  . CESAREAN SECTION     x 2  . CHOLECYSTECTOMY  2011  . COLONOSCOPY  most recent 01/2012   x 2->approx 2011 was done for BRBPR, unclear dx (rectal ulcer? perf'd diverticulum?).  Another about 2015  for abd pain w/u?  No polyps detected on either colonoscopy.  Need old records for clarification (GI MD was Dr. Thedore Mins in [place])--prior pcp records say "colonoscopy 01/2012 negative".  . ESOPHAGEAL MANOMETRY N/A 08/05/2019   NORMAL Procedure: ESOPHAGEAL MANOMETRY (EM);  Surgeon: Tressia Danas, MD;  Location: [facility];  Service: Gastroenterology;  Laterality: N/A;  . ESOPHAGOGASTRODUODENOSCOPY  06/2019     06/2019->reflux esophagitis, o/w normal.  . TONSILLECTOMY AND ADENOIDECTOMY  2009    Family History  Problem Relation Age of Onset  . Diabetes Mother   . Alcohol abuse Mother   . High blood pressure Mother   . Rheumatic fever Father   . Heart attack Father   . Diabetes Brother   . Breast cancer Maternal Aunt   . Colon cancer Neg Hx   . Esophageal cancer Neg Hx   .  Rectal cancer Neg Hx   . Stomach cancer Neg Hx     Social History   Socioeconomic History  . Marital status: Married    Spouse name: Elmyra Ricks  . Number of children: 2  . Years of education: Not on file  . Highest education level: Not on file  Occupational History  . Occupation: Conservation officer, historic buildings, Chartered loss adjuster (travel required)  Tobacco Use  . Smoking status: Never Smoker  . Smokeless tobacco: Never Used  Vaping Use  . Vaping Use: Never used  Substance and Sexual Activity  . Alcohol use: Yes    Comment: wine occasional  . Drug use: Never  . Sexual activity: Not on file  Other Topics Concern  . Not on file  Social History Narrative   Married, 2 daughters.   Moved to [facility] from [facility] 2019 to be closer to daughter and grandchildren.   Educ: College in [place].     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   In [place], she works from home for [employer].   Social Determinants of Health   Financial Resource Strain: Not on file  Food Insecurity: Not on file  Transportation Needs: Not on file  Physical Activity: Not on file  Stress: Not on file  Social Connections: Not on file  Intimate Partner Violence: Not on file    Outpatient Medications Prior to Visit  Medication Sig Dispense Refill  . acetaminophen (TYLENOL) 500 MG tablet Take 1,000 mg by mouth every 6 (six) hours as needed for mild pain, fever or headache.    . celecoxib (CELEBREX) 100 MG capsule Take 100 mg by mouth 2 (two) times daily.    . cholecalciferol (VITAMIN D) 25 MCG (1000 UT) tablet Take 1,000 Units by mouth daily.    [name] Kitchen gabapentin (NEURONTIN) 300 MG capsule Take 300 mg by mouth 2 (two) times daily.    [name] Kitchen levocetirizine (XYZAL) 5 MG tablet Take 5 mg by mouth daily.     . methocarbamol (ROBAXIN) 500 MG tablet 1-2 tabs po q6h prn for muscle relaxation 30 tablet 1  . montelukast (SINGULAIR) 10 MG tablet TAKE 1 TABLET BY MOUTH AT  BEDTIME (Patient taking differently: Take 10 mg by mouth at  bedtime.) 90 tablet 3  . Omega-3 Fatty Acids (OMEGA-3 FISH OIL PO) Take 1 capsule by mouth daily.     . Probiotic Product (PROBIOTIC PO) Take 1 capsule by mouth daily.    . traMADol (ULTRAM) 50 MG tablet 1-2 tabs po q6h prn pain 60 tablet 0  . lisinopril (ZESTRIL) 5 MG tablet TAKE 1 TABLET BY MOUTH  DAILY (Patient taking differently:  Take 5 mg by mouth daily.) 90 tablet 3  . meloxicam (MOBIC) 15 MG tablet TAKE 1 TABLET BY MOUTH  DAILY AS NEEDED FOR PAIN 90 tablet 3  . pantoprazole (PROTONIX) 40 MG tablet TAKE 1 TABLET BY MOUTH  TWICE DAILY (Patient taking differently: Take 40 mg by mouth daily.) 180 tablet 3   No facility-administered medications prior to visit.    Allergies  Allergen Reactions  . Codeine Anaphylaxis   ROS Review of Systems  Constitutional: Negative for appetite change, chills, fatigue and fever.  HENT: Negative for congestion, dental problem, ear pain and sore throat.   Eyes: Negative for discharge, redness and visual disturbance.  Respiratory: Negative for cough, chest tightness, shortness of breath and wheezing.   Cardiovascular: Negative for chest pain, palpitations and leg swelling.  Gastrointestinal: Negative for abdominal pain, blood in stool, diarrhea, nausea and vomiting.  Genitourinary: Negative for difficulty urinating, dysuria, flank pain, frequency, hematuria and urgency.  Musculoskeletal: Negative for arthralgias, back pain, joint swelling, myalgias and neck stiffness.  Skin: Negative for pallor and rash.  Neurological: Negative for dizziness, speech difficulty, weakness and headaches.  Hematological: Negative for adenopathy. Does not bruise/bleed easily.  Psychiatric/Behavioral: Negative for confusion and sleep disturbance. The patient is not nervous/anxious.     PE; Vitals with BMI 01/03/2021 11/17/2020 11/17/2020  Height 4' 10.5" 4\' 11"  4\' 11"   Weight 170 lbs 174 lbs 173 lbs  BMI 34.92 35.13 34.92  Systolic 146 166  Diastolic 83 82 71  Pulse 97  73 62    Exam chaperoned by , CMA  Gen: Alert, well appearing.  Patient is oriented to person, place, time, and situation. AFFECT: pleasant, lucid thought and speech. ENT: Ears: EACs clear, normal epithelium.  TMs with good light reflex and landmarks bilaterally.  Eyes: no injection, icteris, swelling, or exudate.  EOMI, PERRLA. Nose: no drainage or turbinate edema/swelling.  No injection or focal lesion.  Mouth: lips without lesion/swelling.  Oral mucosa pink and moist.  Dentition intact and without obvious caries or gingival swelling.  Oropharynx without erythema, exudate, or swelling.  Neck: supple/nontender.  No LAD, mass, or TM.  Carotid pulses 2+ bilaterally, without bruits. CV: RRR, no m/r/g.   LUNGS: CTA bilat, nonlabored resps, good aeration in all lung fields. ABD: soft, NT, ND, BS normal.  No hepatospenomegaly or mass.  No bruits. EXT: no clubbing, cyanosis, or edema.  Musculoskeletal: no joint swelling, erythema, warmth, or tenderness.  ROM of all joints intact. Skin - no sores or suspicious lesions or rashes or color changes   Pertinent labs:  Lab Results  Component Value Date   TSH 2.230 01/04/2020   Lab Results  Component Value Date   WBC 7.3 01/04/2020   HGB 13.2 01/04/2020   HCT 40.9 01/04/2020   MCV 82 01/04/2020   PLT 317 01/04/2020   Lab Results  Component Value Date   CREATININE 0.90 05/10/2020   BUN 12 05/10/2020   NA 139 05/10/2020   K 4.7 05/10/2020   CL 105 05/10/2020   CO2 26 05/10/2020   Lab Results  Component Value Date   ALT 16 05/10/2020   AST 17 05/10/2020   ALKPHOS 134 (H) 01/04/2020   BILITOT 0.5 05/10/2020   Lab Results  Component Value Date   CHOL 195 01/04/2020   Lab Results  Component Value Date   HDL 46 01/04/2020   Lab Results  Component Value Date   LDLCALC 122 (H) 01/04/2020   Lab Results  Component Value Date   TRIG 150 (H) 01/04/2020   Lab Results  Component Value Date   HGBA1C 6.2 (H) 01/04/2020     ASSESSMENT AND PLAN:   1) HTN, not ideal control. Inc lisinopril to 10mg  qd, cont home bp/hr monitoring. Lytes/cr today.  2) HLD: no statin, but hx of being on one, lost wt and was able to d/c this med in the past. FLP today.  3) Prediabetes: making dietary mod, increasing exercise. Hba1c and fasting glucose today.  4) GERD: signif improvement due to dietary modification. PPI bid did not help and I've taken this off her med list.  5) Health maintenance exam: Reviewed age and gender appropriate health maintenance issues (prudent diet, regular exercise, health risks of tobacco and excessive alcohol, use of seatbelts, fire alarms in home, use of sunscreen).  Also reviewed age and gender appropriate health screening as well as vaccine recommendations. Vaccines: ALL UTD. Labs: fasting HP + Hba1c. Cervical ca screening: pt is s/p abd hysterectomy in 2005 for benign dx.  Saw GYN 10/2021 and pap was done and was normal (a [facility] provider in [place] per pt). Breast ca screening: next screening mammogram due 10/2021. Colon ca screening: next screening colonoscopy due 2023.  6) Lumbar DDD: signif improvement s/p ESI, ongoing PT currently, celebrex 100mg  bid and plan to f/u with ortho and wean slowly off celebrex.  An After Visit Summary was printed and given to the patient.  FOLLOW UP:  Return in about 2 weeks (around 01/17/2021) for f/u HTN.  Signed:  Santiago BumpersPhil Kanija Remmel, MD           01/03/2021

## 2021-01-04 ENCOUNTER — Other Ambulatory Visit: Payer: Self-pay

## 2021-01-04 MED ORDER — METFORMIN HCL 500 MG PO TABS: 500.0000 mg | ORAL_TABLET | Freq: Two times a day (BID) | ORAL | 2 refills | Status: DC

## 2021-01-17 ENCOUNTER — Ambulatory Visit: Payer: 59 | Admitting: Family Medicine

## 2021-01-17 NOTE — Progress Notes (Deleted)
OFFICE VISIT  01/17/2021  CC: No chief complaint on file.   HPI:    Patient is a 60 y.o. Caucasian female who presents for 2 week f/u HTN. A/P as of last visit: ") HTN, not ideal control. Inc lisinopril to 10mg  qd, cont home bp/hr monitoring. Lytes/cr today.  2) HLD: no statin, but hx of being on one, lost wt and was able to d/c this med in the past. FLP today.  3) Prediabetes: making dietary mod, increasing exercise. Hba1c and fasting glucose today.  4) GERD: signif improvement due to dietary modification. PPI bid did not help and I've taken this off her med list.  5) Health maintenance exam: Reviewed age and gender appropriate health maintenance issues (prudent diet, regular exercise, health risks of tobacco and excessive alcohol, use of seatbelts, fire alarms in home, use of sunscreen).  Also reviewed age and gender appropriate health screening as well as vaccine recommendations. Vaccines: ALL UTD. Labs: fasting HP + Hba1c. Cervical ca screening: pt is s/p abd hysterectomy in 2005 for benign dx.  Saw GYN 10/2021 and pap was done and was normal (a [facility] provider in [place] per pt). Breast ca screening: next screening mammogram due 10/2021. Colon ca screening: next screening colonoscopy due 2023.  6) Lumbar DDD: signif improvement s/p ESI, ongoing PT currently, celebrex 100mg  bid and plan to f/u with ortho and wean slowly off celebrex."  INTERIM HX: Last visit her labs showed Hba1c up to 6.8% so we started her on metformin. LDL was 126 and she needs statin but I held off on starting this right now.    Past Medical History:  Diagnosis Date  . Allergic rhinitis   . Anemia   . Arthralgia of multiple sites    Gen rheum lab w/u normal/neg 11/2016 by prior PCP  . Constipation   . DDD (degenerative disc disease), lumbar 11/2020   R glut/hip pain->ortho, MRI->disc causing pinched nerve at L3-4 level on R, signif dz on L at L4-5 and L5-S1--->ESI on R 11/2020 very  helpful, +PT.  . Diabetes mellitus (HCC) 2022   Prediab 2017 --Old PCP records state only that her Hba1c was 6.1% in 2017: she took metformin x ? 6-12 mo, rx'd by wt loss MD.  A1c 6.1% Nov 2019. A1c 6.2% 12/2019. A1c 6.8% 12/2020.  01/2020 GERD (gastroesophageal reflux disease)    +grade A esophagitis 06/2019 EGD.  Bx neg for eosinophilic esoph. 08/2019 esoph manometry normal, pH probe + abnl reflux not fully suppressed by PPI. PPI BID + H2 blocker as of 08/2019.  09/2019 History of rectal bleeding    remote past->? rectal ulcer vs perf'd divertic-->need old records for clarif.  09/2019 Hypercholesterolemia    Past hx of statin use, then was able to come off meds when she lost wt  . Hypertension   . Insomnia   . Joint pain   . Palpitations   . Right hip pain    MRI showed labral tear, got steroid injection. Also troch bursitis.  . Right shoulder pain 05/2020   RC tear, bicipital pathology, AC arthrosis and impingement-->to have surgery    Past Surgical History:  Procedure Laterality Date  . 24 HOUR PH STUDY N/A 08/05/2019   +GERD. Procedure: 24 HOUR PH STUDY;  Surgeon: 06/2020, MD;  Location: [facility];  Service: Gastroenterology;  Laterality: N/A;  . ABDOMINAL HYSTERECTOMY  2005   for benign dx (No BSO)  . BREAST BIOPSY Right 2005  . CESAREAN SECTION  x 2  . CHOLECYSTECTOMY  2011  . COLONOSCOPY  most recent 01/2012   x 2->approx 2011 was done for BRBPR, unclear dx (rectal ulcer? perf'd diverticulum?).  Another about 2015 for abd pain w/u?  No polyps detected on either colonoscopy.  Need old records for clarification (GI MD was Dr. Thedore Mins in [place])--prior pcp records say "colonoscopy 01/2012 negative".  . ESOPHAGEAL MANOMETRY N/A 08/05/2019   NORMAL Procedure: ESOPHAGEAL MANOMETRY (EM);  Surgeon: Tressia Danas, MD;  Location: [facility];  Service: Gastroenterology;  Laterality: N/A;  . ESOPHAGOGASTRODUODENOSCOPY  06/2019     06/2019->reflux esophagitis, o/w normal.  . TONSILLECTOMY AND  ADENOIDECTOMY  2009    Outpatient Medications Prior to Visit  Medication Sig Dispense Refill  . acetaminophen (TYLENOL) 500 MG tablet Take 1,000 mg by mouth every 6 (six) hours as needed for mild pain, fever or headache.    . celecoxib (CELEBREX) 100 MG capsule Take 100 mg by mouth 2 (two) times daily.    . cholecalciferol (VITAMIN D) 25 MCG (1000 UT) tablet Take 1,000 Units by mouth daily.    [name] Kitchen gabapentin (NEURONTIN) 300 MG capsule Take 300 mg by mouth 2 (two) times daily.    [name] Kitchen levocetirizine (XYZAL) 5 MG tablet Take 5 mg by mouth daily.     [name] Kitchen lisinopril (ZESTRIL) 10 MG tablet Take 1 tablet (10 mg total) by mouth daily. 30 tablet 0  . metFORMIN (GLUCOPHAGE) 500 MG tablet Take 1 tablet (500 mg total) by mouth 2 (two) times daily. 60 tablet 2  . methocarbamol (ROBAXIN) 500 MG tablet 1-2 tabs po q6h prn for muscle relaxation 30 tablet 1  . montelukast (SINGULAIR) 10 MG tablet TAKE 1 TABLET BY MOUTH AT  BEDTIME (Patient taking differently: Take 10 mg by mouth at bedtime.) 90 tablet 3  . Omega-3 Fatty Acids (OMEGA-3 FISH OIL PO) Take 1 capsule by mouth daily.     . Probiotic Product (PROBIOTIC PO) Take 1 capsule by mouth daily.    . traMADol (ULTRAM) 50 MG tablet 1-2 tabs po q6h prn pain 60 tablet 0   No facility-administered medications prior to visit.    Allergies  Allergen Reactions  . Codeine Anaphylaxis    ROS As per HPI  PE: Vitals with BMI 01/03/2021 11/17/2020 11/17/2020  Height 4' 10.5" 4\' 11"  4\' 11"   Weight 170 lbs 174 lbs 173 lbs  BMI 34.92 35.13 34.92  Systolic 146 166  Diastolic 83 82 71  Pulse 97 73 62     ***  LABS:    Chemistry      Component Value Date/Time   NA 140 01/03/2021 0929   NA 140 01/04/2020 1158   K 4.6 01/03/2021 0929   CL 104 01/03/2021 0929   CO2 26 01/03/2021 0929   BUN 16 01/03/2021 0929   BUN 17 01/04/2020 1158   CREATININE 0.92 01/03/2021 0929   CREATININE 0.90 05/10/2020 1427   GLU 91 01/03/2018 0000      Component Value  Date/Time   CALCIUM 9.8 01/03/2021 0929   ALKPHOS 99 01/03/2021 0929   AST 32 01/03/2021 0929   ALT 36 (H) 01/03/2021 0929   BILITOT 0.4 01/03/2021 0929   BILITOT <0.2 01/04/2020 1158     Lab Results  Component Value Date   CHOL 207 (H) 01/03/2021   HDL 52.00 01/03/2021   LDLCALC 126 (H) 01/03/2021   TRIG 146.0 01/03/2021   CHOLHDL 4 01/03/2021   Lab Results  Component Value Date   TSH 2.04  01/03/2021   Lab Results  Component Value Date   WBC 6.1 01/03/2021   HGB 13.4 01/03/2021   HCT 41.9 01/03/2021   MCV 81.7 01/03/2021   PLT 292.0 01/03/2021   Lab Results  Component Value Date   HGBA1C 6.8 (H) 01/03/2021   IMPRESSION AND PLAN:  No problem-specific Assessment & Plan notes found for this encounter.   An After Visit Summary was printed and given to the patient.  FOLLOW UP: No follow-ups on file.  Signed:  Santiago Bumpers, MD           01/17/2021

## 2021-01-24 ENCOUNTER — Ambulatory Visit: Payer: 59 | Admitting: Family Medicine

## 2021-01-26 ENCOUNTER — Other Ambulatory Visit: Payer: Self-pay | Admitting: Family Medicine

## 2021-01-26 ENCOUNTER — Ambulatory Visit: Payer: 59 | Admitting: Family Medicine

## 2021-02-02 ENCOUNTER — Ambulatory Visit: Payer: 59 | Admitting: Family Medicine

## 2021-02-18 ENCOUNTER — Other Ambulatory Visit: Payer: Self-pay | Admitting: Family Medicine

## 2021-02-21 ENCOUNTER — Other Ambulatory Visit: Payer: Self-pay

## 2021-02-21 MED ORDER — METFORMIN HCL 500 MG PO TABS: 500.0000 mg | ORAL_TABLET | Freq: Two times a day (BID) | ORAL | 0 refills | Status: DC

## 2021-03-17 ENCOUNTER — Other Ambulatory Visit: Payer: Self-pay | Admitting: Family Medicine

## 2021-03-23 ENCOUNTER — Other Ambulatory Visit: Payer: Self-pay | Admitting: Family Medicine

## 2021-03-24 ENCOUNTER — Other Ambulatory Visit: Payer: Self-pay

## 2021-03-24 MED ORDER — METFORMIN HCL 500 MG PO TABS: 500.0000 mg | ORAL_TABLET | Freq: Two times a day (BID) | ORAL | 0 refills | Status: DC

## 2021-04-05 ENCOUNTER — Other Ambulatory Visit: Payer: Self-pay

## 2021-04-05 MED ORDER — LISINOPRIL 10 MG PO TABS: 10.0000 mg | ORAL_TABLET | Freq: Every day | ORAL | 0 refills | Status: DC

## 2021-04-06 ENCOUNTER — Encounter: Payer: Self-pay | Admitting: Family Medicine

## 2021-04-06 ENCOUNTER — Other Ambulatory Visit: Payer: Self-pay

## 2021-04-06 ENCOUNTER — Ambulatory Visit (INDEPENDENT_AMBULATORY_CARE_PROVIDER_SITE_OTHER): Payer: 59 | Admitting: Family Medicine

## 2021-04-06 VITALS — BP 105/64 | HR 77 | Temp 98.0°F | Resp 16 | Ht 58.5 in | Wt 169.8 lb

## 2021-04-06 DIAGNOSIS — I10 Essential (primary) hypertension: Secondary | ICD-10-CM

## 2021-04-06 DIAGNOSIS — E7800 Pure hypercholesterolemia, unspecified: Secondary | ICD-10-CM

## 2021-04-06 DIAGNOSIS — E119 Type 2 diabetes mellitus without complications: Secondary | ICD-10-CM

## 2021-04-06 LAB — POCT GLYCOSYLATED HEMOGLOBIN (HGB A1C)
HbA1c POC (<> result, manual entry): 5.9 % (ref 4.0–5.6)
HbA1c, POC (controlled diabetic range): 5.9 % (ref 0.0–7.0)
HbA1c, POC (prediabetic range): 5.9 % (ref 5.7–6.4)
Hemoglobin A1C: 5.9 % — AB (ref 4.0–5.6)

## 2021-04-06 LAB — MICROALBUMIN / CREATININE URINE RATIO
Creatinine,U: 27.3 mg/dL
Microalb Creat Ratio: 2.6 mg/g (ref 0.0–30.0)
Microalb, Ur: <0.7 mg/dL (ref 0.0–1.9)

## 2021-04-06 MED ORDER — METFORMIN HCL 500 MG PO TABS: 500.0000 mg | ORAL_TABLET | Freq: Two times a day (BID) | ORAL | 3 refills | Status: DC

## 2021-04-06 MED ORDER — ATORVASTATIN CALCIUM 20 MG PO TABS: 20.0000 mg | ORAL_TABLET | Freq: Every day | ORAL | 2 refills | Status: DC

## 2021-04-06 NOTE — Progress Notes (Signed)
OFFICE VISIT  04/06/2021  CC:  Chief Complaint  Patient presents with   Follow-up    RCI; pt is fasting    HPI:    Patient is a 60 y.o. Caucasian female who presents for 3 mo f/u HTN, HLD, and recently dx'd DM 2. A/P as of last visit: "1) HTN, not ideal control. Inc lisinopril to 10mg  qd, cont home bp/hr monitoring. Lytes/cr today.   2) HLD: no statin, but hx of being on one, lost wt and was able to d/c this med in the past. FLP today.   3) Prediabetes: making dietary mod, increasing exercise. Hba1c and fasting glucose today.   4) GERD: signif improvement due to dietary modification. PPI bid did not help and I've taken this off her med list.   5) Health maintenance exam: Reviewed age and gender appropriate health maintenance issues (prudent diet, regular exercise, health risks of tobacco and excessive alcohol, use of seatbelts, fire alarms in home, use of sunscreen).  Also reviewed age and gender appropriate health screening as well as vaccine recommendations. Vaccines: ALL UTD. Labs: fasting HP + Hba1c. Cervical ca screening: pt is s/p abd hysterectomy in 2005 for benign dx.  Saw GYN 10/2021 and pap was done and was normal (a [facility] provider in [place] per pt). Breast ca screening: next screening mammogram due 10/2021. Colon ca screening: next screening colonoscopy due 2023.   6) Lumbar DDD: signif improvement s/p ESI, ongoing PT currently, celebrex 100mg  bid and plan to f/u with ortho and wean slowly off celebrex."  INTERIM HX: She feels very well.  DM: Hba1c rose to 6.8% three mo ago and we started her on metformin 500mg  bid. No probs with med. Qod gluc checks at home110-126.  HLD: LDL goal <70.  Three mo ago her LDL was 126.  We discussed the indication for statin use for her today.  HTN: increased lisinopril to 10mg  qd last visit.  Home bp's around low 100s over 60s.  No weakness or dizziness.  ROS as above, plus--> no fevers, no CP, no SOB, no wheezing, no  cough, no dizziness, no HAs, no rashes, no melena/hematochezia.  No polyuria or polydipsia.  No myalgias or arthralgias.  No focal weakness, paresthesias, or tremors.  No acute vision or hearing abnormalities.  No dysuria or unusual/new urinary urgency or frequency.  No recent changes in lower legs. No n/v/d or abd pain.  No palpitations.    Past Medical History:  Diagnosis Date   Allergic rhinitis    Anemia    Arthralgia of multiple sites    Gen rheum lab w/u normal/neg 11/2016 by prior PCP   Constipation    DDD (degenerative disc disease), lumbar 11/2020   R glut/hip pain->ortho, MRI->disc causing pinched nerve at L3-4 level on R, signif dz on L at L4-5 and L5-S1--->ESI on R 11/2020 very helpful, +PT.   Diabetes mellitus (HCC) 2022   Prediab 2017 --Old PCP records state only that her Hba1c was 6.1% in 2017: she took metformin x ? 6-12 mo, rx'd by wt loss MD.  A1c 6.1% Nov 2019. A1c 6.2% 12/2019. A1c 6.8% 12/2020.   GERD (gastroesophageal reflux disease)    +grade A esophagitis 06/2019 EGD.  Bx neg for eosinophilic esoph. 08/2019 esoph manometry normal, pH probe + abnl reflux not fully suppressed by PPI. PPI BID + H2 blocker as of 08/2019.   History of rectal bleeding    remote past->? rectal ulcer vs perf'd divertic-->need old records for clarif.  Hypercholesterolemia    Past hx of statin use, then was able to come off meds when she lost wt   Hypertension    Insomnia    Joint pain    Palpitations    Right hip pain    MRI showed labral tear, got steroid injection. Also troch bursitis.   Right shoulder pain 05/2020   RC tear, bicipital pathology, AC arthrosis and impingement-->to have surgery    Past Surgical History:  Procedure Laterality Date   29 HOUR PH STUDY N/A 08/05/2019   +GERD. Procedure: 24 HOUR PH STUDY;  Surgeon: Tressia Danas, MD;  Location: [facility];  Service: Gastroenterology;  Laterality: N/A;   ABDOMINAL HYSTERECTOMY  2005   for benign dx (No BSO)   BREAST  BIOPSY Right 2005   CESAREAN SECTION     x 2   CHOLECYSTECTOMY  2011   COLONOSCOPY  most recent 01/2012   x 2->approx 2011 was done for BRBPR, unclear dx (rectal ulcer? perf'd diverticulum?).  Another about 2015 for abd pain w/u?  No polyps detected on either colonoscopy.  Need old records for clarification (GI MD was Dr. Thedore Mins in [place])--prior pcp records say "colonoscopy 01/2012 negative".   ESOPHAGEAL MANOMETRY N/A 08/05/2019   NORMAL Procedure: ESOPHAGEAL MANOMETRY (EM);  Surgeon: Tressia Danas, MD;  Location: [facility];  Service: Gastroenterology;  Laterality: N/A;   ESOPHAGOGASTRODUODENOSCOPY  06/2019     06/2019->reflux esophagitis, o/w normal.   TONSILLECTOMY AND ADENOIDECTOMY  2009    Outpatient Medications Prior to Visit  Medication Sig Dispense Refill   celecoxib (CELEBREX) 100 MG capsule Take 100 mg by mouth 2 (two) times daily.     cholecalciferol (VITAMIN D) 25 MCG (1000 UT) tablet Take 1,000 Units by mouth daily.     gabapentin (NEURONTIN) 300 MG capsule Take 300 mg by mouth 2 (two) times daily.     levocetirizine (XYZAL) 5 MG tablet Take 5 mg by mouth daily.      lisinopril (ZESTRIL) 10 MG tablet Take 1 tablet (10 mg total) by mouth daily. 30 tablet 0   meloxicam (MOBIC) 15 MG tablet Take 15 mg by mouth daily.     montelukast (SINGULAIR) 10 MG tablet TAKE 1 TABLET BY MOUTH AT  BEDTIME (Patient taking differently: Take 10 mg by mouth at bedtime.) 90 tablet 3   Omega-3 Fatty Acids (OMEGA-3 FISH OIL PO) Take 1 capsule by mouth daily.      pantoprazole (PROTONIX) 40 MG tablet Take 40 mg by mouth 2 (two) times daily.     Probiotic Product (PROBIOTIC PO) Take 1 capsule by mouth daily.     metFORMIN (GLUCOPHAGE) 500 MG tablet Take 1 tablet (500 mg total) by mouth 2 (two) times daily. 28 tablet 0   acetaminophen (TYLENOL) 500 MG tablet Take 1,000 mg by mouth every 6 (six) hours as needed for mild pain, fever or headache. (Patient not taking: Reported on 04/06/2021)     traMADol  (ULTRAM) 50 MG tablet 1-2 tabs po q6h prn pain (Patient not taking: Reported on 04/06/2021) 60 tablet 0   methocarbamol (ROBAXIN) 500 MG tablet 1-2 tabs po q6h prn for muscle relaxation (Patient not taking: Reported on 04/06/2021) 30 tablet 1   No facility-administered medications prior to visit.    Allergies  Allergen Reactions   Codeine Anaphylaxis    ROS As per HPI  PE: Vitals with BMI 04/06/2021 01/03/2021 11/17/2020  Height 4' 10.5" 4' 10.5" 4\' 11"   Weight 169 lbs 13 oz 170 lbs 174  lbs  BMI 34.88 34.92 35.13  Systolic 105 146 409  Diastolic 64 83 82  Pulse 77 97 73     Gen: Alert, well appearing.  Patient is oriented to person, place, time, and situation. AFFECT: pleasant, lucid thought and speech. CV: RRR, no m/r/g.   LUNGS: CTA bilat, nonlabored resps, good aeration in all lung fields. EXT: no clubbing or cyanosis.  no edema.    LABS:  Lab Results  Component Value Date   TSH 2.04 01/03/2021   Lab Results  Component Value Date   WBC 6.1 01/03/2021   HGB 13.4 01/03/2021   HCT 41.9 01/03/2021   MCV 81.7 01/03/2021   PLT 292.0 01/03/2021   Lab Results  Component Value Date   CREATININE 0.92 01/03/2021   BUN 16 01/03/2021   NA 140 01/03/2021   K 4.6 01/03/2021   CL 104 01/03/2021   CO2 26 01/03/2021   Lab Results  Component Value Date   ALT 36 (H) 01/03/2021   AST 32 01/03/2021   ALKPHOS 99 01/03/2021   BILITOT 0.4 01/03/2021   Lab Results  Component Value Date   CHOL 207 (H) 01/03/2021   Lab Results  Component Value Date   HDL 52.00 01/03/2021   Lab Results  Component Value Date   LDLCALC 126 (H) 01/03/2021   Lab Results  Component Value Date   TRIG 146.0 01/03/2021   Lab Results  Component Value Date   CHOLHDL 4 01/03/2021   Lab Results  Component Value Date   HGBA1C 5.9 (A) 04/06/2021   HGBA1C 5.9 04/06/2021   HGBA1C 5.9 04/06/2021   HGBA1C 5.9 04/06/2021   Vit D level 01/04/20 was 23  Lab Results  Component Value Date    VITAMINB12 438 01/04/2020   POC Hba1c today->5.9%.  IMPRESSION AND PLAN:  1) DM 2, doing great since starting metformin 500 bid 3 mo ago. POC a1c today->5.9%. Will monitor lytes/cr again in 3 mo. Urine microalbumin today. Feet exam next f/u. She already gets annual eye exams. Will discuss indication for prevnar 20 at future f/u.  2) HTN: great control on lisinopril 20mg  qd. Lytes/cr monitoring at next f/u in 3 mo.  3) HLD: goal LDL <70. It was 126 3 mo ago.  Start atorvastatin 20mg  qd today, recheck lipids and hepatic panel in 3 mo.  An After Visit Summary was printed and given to the patient.  FOLLOW UP: Return in about 3 months (around 07/07/2021) for routine chronic illness f/u. Next cpe 12/2021  Signed:  07/09/2021, MD           04/06/2021

## 2021-04-10 ENCOUNTER — Other Ambulatory Visit: Payer: Self-pay | Admitting: Family Medicine

## 2021-04-11 ENCOUNTER — Other Ambulatory Visit: Payer: Self-pay

## 2021-04-11 MED ORDER — LISINOPRIL 10 MG PO TABS: 10.0000 mg | ORAL_TABLET | Freq: Every day | ORAL | 0 refills | Status: DC

## 2021-04-15 ENCOUNTER — Other Ambulatory Visit: Payer: Self-pay | Admitting: Family Medicine

## 2021-04-17 ENCOUNTER — Encounter: Payer: Self-pay | Admitting: Family Medicine

## 2021-04-17 NOTE — Telephone Encounter (Signed)
LM for pt to return call regarding rx. Need to know if she is still taking this med and requesting RF or if automatic RF from pharmacy

## 2021-04-18 NOTE — Telephone Encounter (Signed)
Requesting: Tramadol Contract: n/a  UDS: n/a Last Visit: 04/06/21 Next Visit: 07/07/21 Last Refill: 11/17/20(60,0)  Please Advise. Medication pending. See mychart message from 7/11

## 2021-04-18 NOTE — Telephone Encounter (Signed)
Pt made aware of med refill.

## 2021-05-27 ENCOUNTER — Other Ambulatory Visit: Payer: Self-pay | Admitting: Family Medicine

## 2021-06-08 HISTORY — PX: ROTATOR CUFF REPAIR: SHX139

## 2021-07-07 ENCOUNTER — Ambulatory Visit: Payer: 59 | Admitting: Family Medicine

## 2021-07-14 ENCOUNTER — Other Ambulatory Visit: Payer: Self-pay

## 2021-07-14 MED ORDER — ATORVASTATIN CALCIUM 20 MG PO TABS: 20.0000 mg | ORAL_TABLET | Freq: Every day | ORAL | 0 refills | Status: DC

## 2021-07-17 ENCOUNTER — Ambulatory Visit: Payer: 59 | Admitting: Family Medicine

## 2021-07-18 ENCOUNTER — Other Ambulatory Visit: Payer: Self-pay

## 2021-07-24 ENCOUNTER — Ambulatory Visit (INDEPENDENT_AMBULATORY_CARE_PROVIDER_SITE_OTHER): Payer: 59 | Admitting: Family Medicine

## 2021-07-24 ENCOUNTER — Other Ambulatory Visit: Payer: Self-pay

## 2021-07-24 ENCOUNTER — Encounter: Payer: Self-pay | Admitting: Family Medicine

## 2021-07-24 VITALS — BP 117/76 | HR 75 | Temp 98.3°F | Ht 58.5 in | Wt 172.0 lb

## 2021-07-24 DIAGNOSIS — E7800 Pure hypercholesterolemia, unspecified: Secondary | ICD-10-CM

## 2021-07-24 DIAGNOSIS — I10 Essential (primary) hypertension: Secondary | ICD-10-CM

## 2021-07-24 DIAGNOSIS — E119 Type 2 diabetes mellitus without complications: Secondary | ICD-10-CM | POA: Diagnosis not present

## 2021-07-24 DIAGNOSIS — Z23 Encounter for immunization: Secondary | ICD-10-CM

## 2021-07-24 LAB — COMPREHENSIVE METABOLIC PANEL WITH GFR
ALT: 22 U/L (ref 0–35)
AST: 18 U/L (ref 0–37)
Albumin: 4.4 g/dL (ref 3.5–5.2)
Alkaline Phosphatase: 86 U/L (ref 39–117)
BUN: 16 mg/dL (ref 6–23)
CO2: 25 meq/L (ref 19–32)
Calcium: 9.4 mg/dL (ref 8.4–10.5)
Chloride: 106 meq/L (ref 96–112)
Creatinine, Ser: 0.89 mg/dL (ref 0.40–1.20)
GFR: 69.63 mL/min
Glucose, Bld: 110 mg/dL — ABNORMAL HIGH (ref 70–99)
Potassium: 4.2 meq/L (ref 3.5–5.1)
Sodium: 140 meq/L (ref 135–145)
Total Bilirubin: 0.3 mg/dL (ref 0.2–1.2)
Total Protein: 6.8 g/dL (ref 6.0–8.3)

## 2021-07-24 LAB — LIPID PANEL
Cholesterol: 165 mg/dL (ref 0–200)
HDL: 53.4 mg/dL (ref >39.00)
LDL Cholesterol: 87 mg/dL (ref 0–99)
NonHDL: 111.47
Total CHOL/HDL Ratio: 3
Triglycerides: 122 mg/dL (ref 0.0–149.0)
VLDL: 24.4 mg/dL (ref 0.0–40.0)

## 2021-07-24 LAB — HEMOGLOBIN A1C: Hgb A1c MFr Bld: 6.7 % — ABNORMAL HIGH (ref 4.6–6.5)

## 2021-07-24 MED ORDER — MONTELUKAST SODIUM 10 MG PO TABS: 10.0000 mg | ORAL_TABLET | Freq: Every day | ORAL | 3 refills | Status: DC

## 2021-07-24 MED ORDER — ATORVASTATIN CALCIUM 20 MG PO TABS: 20.0000 mg | ORAL_TABLET | Freq: Every day | ORAL | 3 refills | Status: DC

## 2021-07-24 MED ORDER — LISINOPRIL 10 MG PO TABS: 10.0000 mg | ORAL_TABLET | Freq: Every day | ORAL | 3 refills | Status: DC

## 2021-07-24 NOTE — Progress Notes (Signed)
OFFICE VISIT  07/24/2021  CC:  Chief Complaint  Patient presents with   Follow-up    RCI, pt is fasting    HPI:    Patient is a 60 y.o. female who presents for 3 mo f/u DM 2, HTN, and HLD. A/P as of last visit: "1) DM 2, doing great since starting metformin 500 bid 3 mo ago. POC a1c today->5.9%. Will monitor lytes/cr again in 3 mo. Urine microalbumin today. Feet exam next f/u. She already gets annual eye exams. Will discuss indication for prevnar 20 at future f/u.   2) HTN: great control on lisinopril 20mg  qd. Lytes/cr monitoring at next f/u in 3 mo.   3) HLD: goal LDL <70. It was 126 3 mo ago.  Start atorvastatin 20mg  qd today, recheck lipids and hepatic panel in 3 mo."  INTERIM HX: Feeling well. Diet much improved. Exercising less lately b/c LBP-->has had 2 ESI's in the last 3 wks. Glucoses 120s, even after getting steroid injections. Home bp monitoring: consistently <130/80. Compliant with atorva 20 qd.  ROS as above, plus--> no fevers, no CP, no SOB, no wheezing, no cough, no dizziness, no HAs, no rashes, no melena/hematochezia.  No polyuria or polydipsia.  No myalgias or arthralgias.  No focal weakness, paresthesias, or tremors.  No acute vision or hearing abnormalities.  No dysuria or unusual/new urinary urgency or frequency.  No recent changes in lower legs. No n/v/d or abd pain.  No palpitations.    Past Medical History:  Diagnosis Date   Allergic rhinitis    Anemia    Arthralgia of multiple sites    Gen rheum lab w/u normal/neg 11/2016 by prior PCP   Constipation    DDD (degenerative disc disease), lumbar 11/2020   R glut/hip pain->ortho, MRI->disc causing pinched nerve at L3-4 level on R, signif dz on L at L4-5 and L5-S1--->ESI on R 11/2020 very helpful, +PT.   Diabetes mellitus (HCC) 2022   Prediab 2017 --Old PCP records state only that her Hba1c was 6.1% in 2017: she took metformin x ? 6-12 mo, rx'd by wt loss MD.  A1c 6.1% Nov 2019. A1c 6.2% 12/2019. A1c  6.8% 12/2020.   GERD (gastroesophageal reflux disease)    +grade A esophagitis 06/2019 EGD.  Bx neg for eosinophilic esoph. 08/2019 esoph manometry normal, pH probe + abnl reflux not fully suppressed by PPI. PPI BID + H2 blocker as of 08/2019.   History of rectal bleeding    remote past->? rectal ulcer vs perf'd divertic-->need old records for clarif.   Hypercholesterolemia    Past hx of statin use, then was able to come off meds when she lost wt   Hypertension    Insomnia    Joint pain    Palpitations    Right hip pain    MRI showed labral tear, got steroid injection. Also troch bursitis.   Right shoulder pain 05/2020   RC tear, bicipital pathology, AC arthrosis and impingement-->to have surgery    Past Surgical History:  Procedure Laterality Date   66 HOUR PH STUDY N/A 08/05/2019   +GERD. Procedure: 24 HOUR PH STUDY;  Surgeon: 25, MD;  Location: [facility];  Service: Gastroenterology;  Laterality: N/A;   ABDOMINAL HYSTERECTOMY  2005   for benign dx (No BSO)   BREAST BIOPSY Right 2005   CESAREAN SECTION     x 2   CHOLECYSTECTOMY  2011   COLONOSCOPY  most recent 01/2012   x 2->approx 2011 was done for  BRBPR, unclear dx (rectal ulcer? perf'd diverticulum?).  Another about 2015 for abd pain w/u?  No polyps detected on either colonoscopy.  Need old records for clarification (GI MD was Dr. Thedore Mins in [place])--prior pcp records say "colonoscopy 01/2012 negative".   ESOPHAGEAL MANOMETRY N/A 08/05/2019   NORMAL Procedure: ESOPHAGEAL MANOMETRY (EM);  Surgeon: Tressia Danas, MD;  Location: [facility];  Service: Gastroenterology;  Laterality: N/A;   ESOPHAGOGASTRODUODENOSCOPY  06/2019     06/2019->reflux esophagitis, o/w normal.   TONSILLECTOMY AND ADENOIDECTOMY  2009    Outpatient Medications Prior to Visit  Medication Sig Dispense Refill   celecoxib (CELEBREX) 100 MG capsule Take 100 mg by mouth 2 (two) times daily.     cholecalciferol (VITAMIN D) 25 MCG (1000 UT) tablet  Take 1,000 Units by mouth daily.     gabapentin (NEURONTIN) 300 MG capsule Take 300 mg by mouth 2 (two) times daily.     levocetirizine (XYZAL) 5 MG tablet Take 5 mg by mouth daily.      meloxicam (MOBIC) 15 MG tablet Take 15 mg by mouth daily.     metFORMIN (GLUCOPHAGE) 500 MG tablet Take 1 tablet (500 mg total) by mouth 2 (two) times daily. 180 tablet 3   Omega-3 Fatty Acids (OMEGA-3 FISH OIL PO) Take 1 capsule by mouth daily.      pantoprazole (PROTONIX) 40 MG tablet Take 40 mg by mouth 2 (two) times daily.     Probiotic Product (PROBIOTIC PO) Take 1 capsule by mouth daily.     traMADol (ULTRAM) 50 MG tablet TAKE 1 TO 2 TABLETS BY MOUTH EVERY 6 HOURS AS NEEDED FOR PAIN 30 tablet 0   acetaminophen (TYLENOL) 500 MG tablet Take 1,000 mg by mouth every 6 (six) hours as needed for mild pain, fever or headache. (Patient not taking: No sig reported)     atorvastatin (LIPITOR) 20 MG tablet Take 1 tablet (20 mg total) by mouth daily. 30 tablet 0   lisinopril (ZESTRIL) 10 MG tablet Take 1 tablet (10 mg total) by mouth daily. 90 tablet 0   montelukast (SINGULAIR) 10 MG tablet TAKE 1 TABLET BY MOUTH AT  BEDTIME (Patient taking differently: Take 10 mg by mouth at bedtime.) 90 tablet 3   No facility-administered medications prior to visit.    Allergies  Allergen Reactions   Codeine Anaphylaxis    ROS As per HPI  PE: Vitals with BMI 07/24/2021 04/06/2021 01/03/2021  Height 4' 10.5" 4' 10.5" 4' 10.5"  Weight 172 lbs 169 lbs 13 oz 170 lbs  BMI 35.33 34.88 34.92  Systolic 117 105 253  Diastolic 76 64 83  Pulse 75 77 97    Gen: Alert, well appearing.  Patient is oriented to person, place, time, and situation. AFFECT: pleasant, lucid thought and speech. CV: RRR, no m/r/g.   LUNGS: CTA bilat, nonlabored resps, good aeration in all lung fields. EXT: no clubbing or cyanosis.  no edema.  Foot exam - no swelling, tenderness or skin or vascular lesions. Color and temperature is normal. Sensation is  intact. Peripheral pulses are palpable. Toenails are normal.   LABS:  Lab Results  Component Value Date   TSH 2.04 01/03/2021   Lab Results  Component Value Date   WBC 6.1 01/03/2021   HGB 13.4 01/03/2021   HCT 41.9 01/03/2021   MCV 81.7 01/03/2021   PLT 292.0 01/03/2021   Lab Results  Component Value Date   CREATININE 0.92 01/03/2021   BUN 16 01/03/2021   NA 140  01/03/2021   K 4.6 01/03/2021   CL 104 01/03/2021   CO2 26 01/03/2021   Lab Results  Component Value Date   ALT 36 (H) 01/03/2021   AST 32 01/03/2021   ALKPHOS 99 01/03/2021   BILITOT 0.4 01/03/2021   Lab Results  Component Value Date   CHOL 207 (H) 01/03/2021   Lab Results  Component Value Date   HDL 52.00 01/03/2021   Lab Results  Component Value Date   LDLCALC 126 (H) 01/03/2021   Lab Results  Component Value Date   TRIG 146.0 01/03/2021   Lab Results  Component Value Date   CHOLHDL 4 01/03/2021   Lab Results  Component Value Date   HGBA1C 5.9 (A) 04/06/2021   HGBA1C 5.9 04/06/2021   HGBA1C 5.9 04/06/2021   HGBA1C 5.9 04/06/2021   IMPRESSION AND PLAN:  1) HTN: well controlled on lisinopril 10mg  qd. Lytes/cr today.  2) DM 2: well controlled on metformin 500mg  bid. Hba1c today. Feet exam: normal today.  3) HLD: tolerating atorva 20 qd. FLP and hepatic panel today.  An After Visit Summary was printed and given to the patient.  FOLLOW UP: Return in about 3 months (around 10/24/2021) for routine chronic illness f/u. Cpe 12/2021  Signed:  10/26/2021, MD           07/24/2021

## 2021-08-08 ENCOUNTER — Other Ambulatory Visit: Payer: Self-pay | Admitting: Family Medicine

## 2021-09-19 ENCOUNTER — Encounter: Payer: Self-pay | Admitting: Physician Assistant

## 2021-09-19 ENCOUNTER — Ambulatory Visit (INDEPENDENT_AMBULATORY_CARE_PROVIDER_SITE_OTHER): Payer: 59 | Admitting: Physician Assistant

## 2021-09-19 VITALS — BP 142/74 | HR 75 | Ht 58.5 in | Wt 170.0 lb

## 2021-09-19 DIAGNOSIS — R1013 Epigastric pain: Secondary | ICD-10-CM | POA: Diagnosis not present

## 2021-09-19 DIAGNOSIS — K2100 Gastro-esophageal reflux disease with esophagitis, without bleeding: Secondary | ICD-10-CM

## 2021-09-19 DIAGNOSIS — G8929 Other chronic pain: Secondary | ICD-10-CM

## 2021-09-19 MED ORDER — ESOMEPRAZOLE MAGNESIUM 40 MG PO CPDR: 40.0000 mg | DELAYED_RELEASE_CAPSULE | Freq: Two times a day (BID) | ORAL | 5 refills | Status: DC

## 2021-09-19 MED ORDER — FAMOTIDINE 40 MG PO TABS: 40.0000 mg | ORAL_TABLET | Freq: Two times a day (BID) | ORAL | 5 refills | Status: DC

## 2021-09-19 NOTE — Patient Instructions (Addendum)
We have sent the following medications to your pharmacy for you to pick up at your convenience: Nexium 40 mg twice daily 30-60 minutes breakfast and dinner. Famotidine 40 mg twice daily.   Stop Pantoprazole.   Follow up with Dr. Barron Alvine to discuss TIFF.   If you are age 62 or older, your body mass index should be between 23-30. Your Body mass index is 34.93 kg/m. If this is out of the aforementioned range listed, please consider follow up with your Primary Care Provider.  If you are age 41 or younger, your body mass index should be between 19-25. Your Body mass index is 34.93 kg/m. If this is out of the aformentioned range listed, please consider follow up with your Primary Care Provider.   ________________________________________________________  The [place] [facility] providers would like to encourage you to use [facility] to communicate with providers for non-urgent requests or questions.  Due to long hold times on the telephone, sending your provider a message by [facility] may be a faster and more efficient way to get a response.  Please allow 48 business hours for a response.  Please remember that this is for non-urgent requests.  _______________________________________________________

## 2021-09-19 NOTE — Progress Notes (Signed)
Reviewed.  Mykael Trott L. Dallie Patton, MD, MPH  

## 2021-09-19 NOTE — Progress Notes (Signed)
Agree with the assessment and plan as outlined by Hyacinth Meeker, PA-C.  Reviewed previous pH/impedance testing which also demonstrated continued acid reflux despite study being done on PPI therapy, with pH <4 during 8% of the time and elevated DeMeester score.  Will meet with her in the office to discuss antireflux surgical options, to include TIF.  Mauri Temkin, DO, [facility]

## 2021-09-19 NOTE — Progress Notes (Signed)
Chief Complaint: Reflux  HPI:    [name] is a 60 year old female with a past medical history as listed below including reflux and diabetes, known to Dr. Orvan Falconer, who presents clinic today for complaint of reflux.    06/26/2019 EGD with LA class a reflux esophagitis, biopsies negative for EOE.    08/05/2019 esophageal manometry with normal relaxation of the EG junction and normal peristalsis.    08/24/2019 patient seen in clinic by Dr. Orvan Falconer for refractory reflux not responding to Pantoprazole 40 mg twice daily and Famotidine 20 mg nightly.  At that time recommended that she continue to work to achieve a healthy weight.  That time discussed referral to the [facility] health weight management clinic.  Her Pantoprazole was continued 40 mg twice daily and her Famotidine was increased to 20 mg twice daily.  Also referred to [facility] health weight management.    05/10/2020 patient called to explain she had been seen in urgent care for tightness in her chest and diarrhea with no appetite and burning in her throat.  She had been given some Carafate.    07/24/2021 CMP with glucose minimally elevated at 110, hemoglobin A1c 6.7.    Today, the patient tells me that really there has been no change in her symptoms.  She was using Pantoprazole 40 twice a day and Famotidine 20 twice daily but continues with reflux symptoms.  She even bought a bed where she could elevate the head of the bed but still wakes up with acid in her throat and symptoms.  Tells me she did follow with a weight loss clinic as referred at last visit and started to exercise more vigorously but then ended up with a lot of back and leg pain and they eventually diagnosed her with spinal stenosis.  Due to this she has had a setback in her weight loss progress.  She has just now been okayed to start 30 to 40 minutes of walking a day and is planning to start the intermittent fasting, but tells me that "when I do not eat sometimes that makes it worse too",  so she has been nervous to start this.  Currently only using her Pantoprazole 40 twice daily.  She has stopped the Famotidine as it did not seem to help.  Also describes an instance last year where she ended up in urgent care due to worsening reflux symptoms and some vomiting and was given some Carafate, she was told only to use this for 2 weeks, but it did help during that time.  She wonders what is next to be done.    Denies fever, chills, weight loss, blood in her stool or symptoms that awaken her from sleep.  Past Medical History:  Diagnosis Date   Allergic rhinitis    Anemia    Arthralgia of multiple sites    Gen rheum lab w/u normal/neg 11/2016 by prior PCP   Constipation    DDD (degenerative disc disease), lumbar 11/2020   R glut/hip pain->ortho, MRI->disc causing pinched nerve at L3-4 level on R, signif dz on L at L4-5 and L5-S1--->ESI on R 11/2020 very helpful, +PT.   Diabetes mellitus (HCC) 2022   Prediab 2017 --Old PCP records state only that her Hba1c was 6.1% in 2017: she took metformin x ? 6-12 mo, rx'd by wt loss MD.  A1c 6.1% Nov 2019. A1c 6.2% 12/2019. A1c 6.8% 12/2020.   GERD (gastroesophageal reflux disease)    +grade A esophagitis 06/2019 EGD.  Bx  neg for eosinophilic esoph. [id number] esoph manometry normal, pH probe + abnl reflux not fully suppressed by PPI. PPI BID + H2 blocker as of 08/2019.   History of rectal bleeding    remote past->? rectal ulcer vs perf'd divertic-->need old records for clarif.   Hypercholesterolemia    Past hx of statin use, then was able to come off meds when she lost wt   Hypertension    Insomnia    Joint pain    Palpitations    Right hip pain    MRI showed labral tear, got steroid injection. Also troch bursitis.   Right shoulder pain 05/2020   RC tear, bicipital pathology, AC arthrosis and impingement-->to have surgery    Past Surgical History:  Procedure Laterality Date   71 HOUR PH STUDY N/A 08/05/2019   +GERD. Procedure: [place] STUDY;   Surgeon: Thornton Park, MD;  Location: [facility] ENDOSCOPY;  Service: Gastroenterology;  Laterality: N/A;   ABDOMINAL HYSTERECTOMY  2005   for benign dx (No BSO)   BREAST BIOPSY Right 2005   CESAREAN SECTION     x 2   CHOLECYSTECTOMY  2011   COLONOSCOPY  most recent 01/2012   x 2->approx 2011 was done for BRBPR, unclear dx (rectal ulcer? perf'd diverticulum?).  Another about 2015 for abd pain w/u?  No polyps detected on either colonoscopy.  Need old records for clarification (GI MD was Dr. Candiss Norse in [place])--prior pcp records say "colonoscopy 01/2012 negative".   ESOPHAGEAL MANOMETRY N/A 08/05/2019   NORMAL Procedure: ESOPHAGEAL MANOMETRY (EM);  Surgeon: Thornton Park, MD;  Location: [facility] END[facility]COPY;  Service: Gastroenterology;  Laterality: N/A;   ESOPHAGOGASTRODUODENOSCOPY  06/2019     06/2019->reflux esophagitis, o/w normal.   TONSILLECTOMY AND ADENOIDECTOMY  2009    Current Outpatient Medications  Medication Sig Dispense Refill   acetaminophen (TYLENOL) 500 MG tablet Take 1,000 mg by mouth every 6 (six) hours as needed for mild pain, fever or headache. (Patient not taking: No sig reported)     atorvastatin (LIPITOR) 20 MG tablet Take 1 tablet (20 mg total) by mouth daily. 90 tablet 3   celecoxib (CELEBREX) 100 MG capsule Take 100 mg by mouth 2 (two) times daily.     cholecalciferol (VITAMIN D) 25 MCG (1000 UT) tablet Take 1,000 Units by mouth daily.     gabapentin (NEURONTIN) 300 MG capsule Take 300 mg by mouth 2 (two) times daily.     levocetirizine (XYZAL) 5 MG tablet Take 5 mg by mouth daily.      lisinopril (ZESTRIL) 10 MG tablet Take 1 tablet (10 mg total) by mouth daily. 90 tablet 3   meloxicam (MOBIC) 15 MG tablet Take 15 mg by mouth daily.     metFORMIN (GLUCOPHAGE) 500 MG tablet TAKE 1 TABLET BY MOUTH  TWICE DAILY 180 tablet 1   montelukast (SINGULAIR) 10 MG tablet Take 1 tablet (10 mg total) by mouth at bedtime. 90 tablet 3   Omega-3 Fatty Acids (OMEGA-3 FISH OIL PO) Take 1 capsule by  mouth daily.      pantoprazole (PROTONIX) 40 MG tablet Take 40 mg by mouth 2 (two) times daily.     Probiotic Product (PROBIOTIC PO) Take 1 capsule by mouth daily.     traMADol (ULTRAM) 50 MG tablet TAKE 1 TO 2 TABLETS BY MOUTH EVERY 6 HOURS AS NEEDED FOR PAIN 30 tablet 0   No current facility-administered medications for this visit.    Allergies as of 09/19/2021 - Review Complete 07/24/2021  Allergen Reaction Noted   Codeine Anaphylaxis 01/29/2019    Family History  Problem Relation Age of Onset   Diabetes Mother    Alcohol abuse Mother    High blood pressure Mother    Rheumatic fever Father    Heart attack Father    Diabetes Brother    Breast cancer Maternal Aunt    Colon cancer Neg Hx    Esophageal cancer Neg Hx    Rectal cancer Neg Hx    Stomach cancer Neg Hx     Social History   Socioeconomic History   Marital status: Married    Spouse name: Roma Kayser   Number of children: 2   Years of education: Not on file   Highest education level: Not on file  Occupational History   Occupation: Presenter, broadcasting, Ecologist (travel required)  Tobacco Use   Smoking status: Never   Smokeless tobacco: Never  Vaping Use   Vaping Use: Never used  Substance and Sexual Activity   Alcohol use: Yes    Comment: wine occasional   Drug use: Never   Sexual activity: Not on file  Other Topics Concern   Not on file  Social History Narrative   Married, 2 daughters.   Moved to [facility] from [facility] 2019 to be closer to daughter and grandchildren.   Educ: College in [place].     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   In [place], she works from home for [employer].   Social Determinants of Health   Financial Resource Strain: Not on file  Food Insecurity: Not on file  Transportation Needs: Not on file  Physical Activity: Not on file  Stress: Not on file  Social Connections: Not on file  Intimate Partner Violence: Not on file    Review of Systems:     Constitutional: No weight loss, fever or chills Cardiovascular: No chest pain   Respiratory: No SOB Gastrointestinal: See HPI and otherwise negative   Physical Exam:  Vital signs: BP (!) 142/74   Pulse 75   Ht 4' 10.5" (1.486 m)   Wt 170 lb (77.1 kg)   BMI 34.93 kg/m    Constitutional:   Pleasant overweight Caucasian female appears to be in NAD, Well developed, Well nourished, alert and cooperative Respiratory: Respirations even and unlabored. Lungs clear to auscultation bilaterally.   No wheezes, crackles, or rhonchi.  Cardiovascular: Normal S1, S2. No MRG. Regular rate and rhythm. No peripheral edema, cyanosis or pallor.  Gastrointestinal:  Soft, nondistended, nontender. No rebound or guarding. Normal bowel sounds. No appreciable masses or hepatomegaly. Rectal:  Not performed.  Psychiatric: Oriented to person, place and time. Demonstrates good judgement and reason without abnormal affect or behaviors.  RELEVANT LABS AND IMAGING: CBC    Component Value Date/Time   WBC 6.1 01/03/2021 0929   RBC 5.13 (H) 01/03/2021 0929   HGB 13.4 01/03/2021 0929   HGB 13.2 01/04/2020 1158   HCT 41.9 01/03/2021 0929   HCT 40.9 01/04/2020 1158   PLT 292.0 01/03/2021 0929   PLT 317 01/04/2020 1158   MCV 81.7 01/03/2021 0929   MCV 82 01/04/2020 1158   MCH 26.3 (L) 01/04/2020 1158   MCHC 32.0 01/03/2021 0929   RDW 17.2 (H) 01/03/2021 0929   RDW 14.1 01/04/2020 1158   LYMPHSABS 2.1 01/03/2021 0929   LYMPHSABS 1.9 01/04/2020 1158   MONOABS 0.5 01/03/2021 0929   EOSABS 0.1 01/03/2021 0929   EOSABS 0.1 01/04/2020 1158   BASOSABS 0.1  01/03/2021 0929   BASOSABS 0.1 01/04/2020 1158    CMP     Component Value Date/Time   NA 140 07/24/2021 0853   NA 140 01/04/2020 1158   K 4.2 07/24/2021 0853   CL 106 07/24/2021 0853   CO2 25 07/24/2021 0853   GLUCOSE 110 (H) 07/24/2021 0853   BUN 16 07/24/2021 0853   BUN 17 01/04/2020 1158   CREATININE 0.89 07/24/2021 0853   CREATININE 0.90 05/10/2020  1427   CALCIUM 9.4 07/24/2021 0853   PROT 6.8 07/24/2021 0853   PROT 7.0 01/04/2020 1158   ALBUMIN 4.4 07/24/2021 0853   ALBUMIN 4.4 01/04/2020 1158   AST 18 07/24/2021 0853   ALT 22 07/24/2021 0853   ALKPHOS 86 07/24/2021 0853   BILITOT 0.3 07/24/2021 0853   BILITOT <0.2 01/04/2020 1158   GFRNONAA 69 05/10/2020 1427   GFRAA 81 05/10/2020 1427    Assessment: 1.  GERD: Refractory, previous EGD and esophageal manometry, no relief with Pantoprazole 40 twice daily and Famotidine 20 twice daily 2.  Epigastric pain: With above  Plan: 1.  At this time we will change patient to Nexium 40 mg twice daily, 30-60 minutes before breakfast and dinner.  Prescribed #60 with 5 refills. 2.  Restarted patient's Famotidine at 40 mg every morning and nightly #60 with 5 refills. 3.  Reviewed antireflux diet and lifestyle modifications. 4.  Discussed possible TIF procedure with the patient.  I have referred her to Dr. Bryan Lemma to discuss further.  She was arranged with him in the next 1 to 2 months to discuss further.  Explained that she may require some additional testing, but they can discuss this further at that time.  If she finds relief with new regimen before then, then she can cancel. 5.  Also discussed possibility of adding in Carafate on a more regular basis if this was helpful in the past.  Martha Newer, PA-C [facility] 09/19/2021, 8:31 AM  Cc: McGowen, Martha Blackwater, MD

## 2021-09-29 ENCOUNTER — Other Ambulatory Visit: Payer: Self-pay

## 2021-09-29 MED ORDER — FAMOTIDINE 40 MG PO TABS: 40.0000 mg | ORAL_TABLET | Freq: Two times a day (BID) | ORAL | 5 refills | Status: DC

## 2021-10-16 ENCOUNTER — Other Ambulatory Visit: Payer: Self-pay | Admitting: Family Medicine

## 2021-10-16 DIAGNOSIS — Z1231 Encounter for screening mammogram for malignant neoplasm of breast: Secondary | ICD-10-CM

## 2021-10-20 ENCOUNTER — Other Ambulatory Visit: Payer: Self-pay

## 2021-10-20 MED ORDER — FAMOTIDINE 40 MG PO TABS: 40.0000 mg | ORAL_TABLET | Freq: Two times a day (BID) | ORAL | 3 refills | Status: DC

## 2021-10-23 ENCOUNTER — Telehealth: Payer: Self-pay | Admitting: General Surgery

## 2021-10-23 ENCOUNTER — Other Ambulatory Visit: Payer: Self-pay

## 2021-10-23 ENCOUNTER — Encounter: Payer: Self-pay | Admitting: Gastroenterology

## 2021-10-23 ENCOUNTER — Ambulatory Visit (INDEPENDENT_AMBULATORY_CARE_PROVIDER_SITE_OTHER): Payer: 59 | Admitting: Gastroenterology

## 2021-10-23 VITALS — BP 128/82 | HR 80 | Ht 58.5 in | Wt 171.0 lb

## 2021-10-23 DIAGNOSIS — K219 Gastro-esophageal reflux disease without esophagitis: Secondary | ICD-10-CM | POA: Diagnosis not present

## 2021-10-23 DIAGNOSIS — E119 Type 2 diabetes mellitus without complications: Secondary | ICD-10-CM | POA: Diagnosis not present

## 2021-10-23 DIAGNOSIS — K449 Diaphragmatic hernia without obstruction or gangrene: Secondary | ICD-10-CM | POA: Diagnosis not present

## 2021-10-23 NOTE — Patient Instructions (Signed)
If you are age 63 or older, your body mass index should be between 23-30. Your Body mass index is 35.13 kg/m. If this is out of the aforementioned range listed, please consider follow up with your Primary Care Provider.  If you are age 48 or younger, your body mass index should be between 19-25. Your Body mass index is 35.13 kg/m. If this is out of the aformentioned range listed, please consider follow up with your Primary Care Provider.   __________________________________________________________  The [place] [facility] providers would like to encourage you to use [facility] to communicate with providers for non-urgent requests or questions.  Due to long hold times on the telephone, sending your provider a message by [facility] may be a faster and more efficient way to get a response.  Please allow 48 business hours for a response.  Please remember that this is for non-urgent requests.   Due to recent changes in healthcare laws, you may see the results of your imaging and laboratory studies on MyChart before your provider has had a chance to review them.  We understand that in some cases there may be results that are confusing or concerning to you. Not all laboratory results come back in the same time frame and the provider may be waiting for multiple results in order to interpret others.  Please give [place] 48 hours in order for your provider to thoroughly review all the results before contacting the office for clarification of your results.   We are referring you to Dr Gaynelle Adu at [facility] for evaluation of your Hiatal hernia. If you do not hear from there office within 2 weeks please call us at [phone].  Thank you for choosing me and [facility].  Vito Cirigliano, D.O.

## 2021-10-23 NOTE — Progress Notes (Signed)
Chief Complaint:    GERD, discuss TIF  HPI:     Martha [name] is a 61 yo female with a history of diabetes, arthralgias, HTN, HLD, spinal stenosis (which limits exercise), referred to me by Dr. Tarri Glenn and Ellouise Newer, PA-C for evaluation of possible antireflux intervention with Transoral Incisionless Fundoplication (TIF) with a goal to stop or significantly reduce acid suppression therapy.  Hx of reflux for 4 years or so. Reflux symptoms suboptimally controlled despite pantoprazole 40 mg bid and famotidine 20 mg bid.  Has had intentional weight loss through [facility].  Sleeps with HOB elevated, but still with nocturnal regurgitation, coughing, choking.  Was most recently changed from Protonix to Nexium 40 mg bid and restarted famotidine 40 mg  bid in 09/2021.  Today, reports still with breakthrough symptoms despite high dose PPI and H2RA. Avoids eating close to bedtime.   GERD history: -Index symptoms: Heartburn, regurgitation, nocturnal cough/choking -Exacerbating features: coffee,eating close to bedtime, tomato based sauces, red wine -Medications trialed: Pantoprazole, famotidine, Carafate, esomeprazole, omeprazole -Current medications: Nexium 40 mg bid -Complications: hiatal hernia  GERD evaluation: -Last EGD: 06/2019 -Barium esophagram: None -Esophageal Manometry: 07/2019: 2.4 cm hiatal hernia, Normal motility -pH/Impedance: 07/2019 (on PPI): pH <4 in 8% of time, DeMeester 16.3.  Elevated reflux episodes in supine position.  Negative symptom correlation by SAP -Bravo: N/A  Endoscopic History: - EGD (06/2019): LA Grade A esophagitis, biopsies negative for EOE, small HH   GERD-HRQL Questionnaire Score: 23/50 (on PPI) and marked "dissatisfied" with current health condition    Review of systems:     No chest pain, no SOB, no fevers, no urinary sx   Past Medical History:  Diagnosis Date   Allergic rhinitis    Anemia    Arthralgia of multiple sites     Gen rheum lab w/u normal/neg 11/2016 by prior PCP   Constipation    DDD (degenerative disc disease), lumbar 11/2020   R glut/hip pain->ortho, MRI->disc causing pinched nerve at L3-4 level on R, signif dz on L at L4-5 and L5-S1--->ESI on R 11/2020 very helpful, +PT.   Diabetes mellitus ([place]) 2022   Prediab 2017 --Old PCP records state only that her Hba1c was 6.1% in 2017: she took metformin x ? 6-12 mo, rx'd by wt loss MD.  A1c 6.1% Nov 2019. A1c 6.2% 12/2019. A1c 6.8% 12/2020.   GERD (gastroesophageal reflux disease)    +grade A esophagitis 06/2019 EGD.  Bx neg for eosinophilic esoph. [id number] esoph manometry normal, pH probe + abnl reflux not fully suppressed by PPI. PPI BID + H2 blocker as of 08/2019.   History of rectal bleeding    remote past->? rectal ulcer vs perf'd divertic-->need old records for clarif.   Hypercholesterolemia    Past hx of statin use, then was able to come off meds when she lost wt   Hypertension    Insomnia    Joint pain    Palpitations    Right hip pain    MRI showed labral tear, got steroid injection. Also troch bursitis.   Right shoulder pain 05/2020   RC tear, bicipital pathology, AC arthrosis and impingement-->to have surgery    Patient's surgical history, family medical history, social history, medications and allergies were all reviewed in Epic    Current Outpatient Medications  Medication Sig Dispense Refill   acetaminophen (TYLENOL) 500 MG tablet Take 1,000 mg by mouth every 6 (six) hours as needed for mild pain, fever or headache.  atorvastatin (LIPITOR) 20 MG tablet Take 1 tablet (20 mg total) by mouth daily. 90 tablet 3   celecoxib (CELEBREX) 100 MG capsule Take 100 mg by mouth 2 (two) times daily.     cholecalciferol (VITAMIN D) 25 MCG (1000 UT) tablet Take 1,000 Units by mouth daily.     esomeprazole (NEXIUM) 40 MG capsule Take 1 capsule (40 mg total) by mouth 2 (two) times daily before a meal. 60 capsule 5   famotidine (PEPCID) 40 MG tablet Take 1  tablet (40 mg total) by mouth 2 (two) times daily. 90 tablet 3   gabapentin (NEURONTIN) 300 MG capsule Take 300 mg by mouth 2 (two) times daily.     levocetirizine (XYZAL) 5 MG tablet Take 5 mg by mouth daily.      lisinopril (ZESTRIL) 10 MG tablet Take 1 tablet (10 mg total) by mouth daily. 90 tablet 3   meloxicam (MOBIC) 15 MG tablet Take 15 mg by mouth daily.     metFORMIN (GLUCOPHAGE) 500 MG tablet TAKE 1 TABLET BY MOUTH  TWICE DAILY 180 tablet 1   montelukast (SINGULAIR) 10 MG tablet Take 1 tablet (10 mg total) by mouth at bedtime. 90 tablet 3   Omega-3 Fatty Acids (OMEGA-3 FISH OIL PO) Take 1 capsule by mouth daily.      Probiotic Product (PROBIOTIC PO) Take 1 capsule by mouth daily.     traMADol (ULTRAM) 50 MG tablet TAKE 1 TO 2 TABLETS BY MOUTH EVERY 6 HOURS AS NEEDED FOR PAIN 30 tablet 0   No current facility-administered medications for this visit.    Physical Exam:     BP 128/82    Pulse 80    Ht 4' 10.5" (1.486 m)    Wt 171 lb (77.6 kg)    SpO2 100%    BMI 35.13 kg/m   GENERAL:  Pleasant female in NAD PSYCH: : Cooperative, normal affect Musculoskeletal:  Normal muscle tone, normal strength NEURO: Alert and oriented x 3, no focal neurologic deficits   IMPRESSION and PLAN:    1) GERD with erosive esophagitis 2) Hiatal hernia Based on Esophageal Manometry (2.4 cm HH) and increasing symptomatology, suspect she has a good sized hiatal hernia and crural defect that will require laparoscopic repair along with fundoplication.  Discussed options at length today, to include concomitant laparoscopic hiatal hernia repair and TIF, and she wishes to proceed as below: - EGD for preoperative assessment.  Evaluate for erosive esophagitis which would need to be treated prior to fundoplication along with assessment of degree of LES laxity and hernia size - Referral to Dr. Redmond Pulling for concomitant hiatal hernia repair/TIF - Continue current acid suppression therapy - Continue antireflux  lifestyle/dietary modifications - Continue HOB elevation - Discussed postoperative dietary and activity limitations at length today and provided with handout - Plan for GES to be done prior to surgery given history of diabetes.  Plan to schedule this after EGD and surgical eval  3) Diabetes - Most recent hemoglobin A1c 6.7% - As above, plan for GES as part of preoperative assessment   The indications, risks, and benefits of EGD were explained to the patient in detail. Risks include but are not limited to bleeding, perforation, adverse reaction to medications, and cardiopulmonary compromise. Sequelae include but are not limited to the possibility of surgery, hospitalization, and mortality. The patient verbalized understanding and wished to proceed. All questions answered, referred to scheduler. Further recommendations pending results of the exam.     Lavena Bullion ,  DO, FACG 10/23/2021, 10:30 AM

## 2021-10-23 NOTE — Telephone Encounter (Signed)
Faxed a referral to [facility]-Dr Andrey Campanile for evaluation to do a CTIFF. 55 pages sent- fax transmittal succesful

## 2021-10-23 NOTE — Telephone Encounter (Signed)
Noted, thanks!

## 2021-10-30 ENCOUNTER — Encounter: Payer: Self-pay | Admitting: Gastroenterology

## 2021-11-01 ENCOUNTER — Ambulatory Visit: Payer: 59

## 2021-11-07 ENCOUNTER — Other Ambulatory Visit: Payer: Self-pay

## 2021-11-07 MED ORDER — ATORVASTATIN CALCIUM 20 MG PO TABS: 20.0000 mg | ORAL_TABLET | Freq: Every day | ORAL | 1 refills | Status: DC

## 2021-11-08 ENCOUNTER — Telehealth: Payer: Self-pay | Admitting: Gastroenterology

## 2021-11-08 ENCOUNTER — Telehealth: Payer: Self-pay

## 2021-11-08 NOTE — Telephone Encounter (Signed)
-----   Message from Jeoffrey Massed, MD sent at 11/08/2021 12:16 PM EST ----- Regarding: FW: obesity + ozempic Britt, please call patient and have her arrange appointment to come in and see me for follow-up diabetes and discuss starting Ozempic.--thx ----- Message ----- From: Gaynelle Adu, MD Sent: 11/08/2021   9:47 AM EST To: Jeoffrey Massed, MD, Shellia Cleverly, DO Subject: obesity + ozempic                              Hi Dr Milinda Cave  I saw this patient to discuss hiatal hernia repair with concomitant TIF procedure by Dr. Barron Alvine.  I think she is a candidate however her BMI does put her at a little bit higher risk for hiatal hernia recurrence.  It is not prohibitive my cutoff is 35.  But since it can take some time to coordinate our schedules I talked to her about Ozempic as a potential alternative to continue to control her diabetes as well as to help with ongoing weight loss efforts.  She was given some information about this.  Was reaching out to you to see if this would be an option for her.  I have prescribed it for some of my long-term bariatric patients as well as some of my preoperative patients seeing me for other issues.  I do realize insurance can be a beast though.  Thanks  Ramond Marrow M. Andrey Campanile, MD, FACS General, Bariatric, & Minimally Invasive Surgery [facility], [place]

## 2021-11-08 NOTE — Telephone Encounter (Signed)
FYI -Pt has appt scheduled for 11/14/21

## 2021-11-08 NOTE — Telephone Encounter (Signed)
Bonita Quin, please see note below. OK to reschedule patient to March. Thanks

## 2021-11-08 NOTE — Telephone Encounter (Signed)
Patient called to ask if it was OK to put off the EGD until March.  Her other surgery will probably not be done until April, and she still has to lose 14 pounds.  Please call patient and advise.

## 2021-11-08 NOTE — Telephone Encounter (Signed)
Noted  

## 2021-11-08 NOTE — Telephone Encounter (Signed)
Yes, that is perfectly reasonable to reschedule EGD from March.

## 2021-11-09 ENCOUNTER — Other Ambulatory Visit: Payer: Self-pay

## 2021-11-09 ENCOUNTER — Ambulatory Visit (INDEPENDENT_AMBULATORY_CARE_PROVIDER_SITE_OTHER): Payer: 59

## 2021-11-09 DIAGNOSIS — Z1231 Encounter for screening mammogram for malignant neoplasm of breast: Secondary | ICD-10-CM | POA: Diagnosis not present

## 2021-11-13 ENCOUNTER — Other Ambulatory Visit: Payer: Self-pay

## 2021-11-13 ENCOUNTER — Encounter: Payer: 59 | Admitting: Gastroenterology

## 2021-11-14 ENCOUNTER — Ambulatory Visit (INDEPENDENT_AMBULATORY_CARE_PROVIDER_SITE_OTHER): Payer: 59 | Admitting: Family Medicine

## 2021-11-14 ENCOUNTER — Encounter: Payer: Self-pay | Admitting: Family Medicine

## 2021-11-14 VITALS — BP 103/70 | HR 81 | Temp 98.1°F | Ht 58.5 in | Wt 167.4 lb

## 2021-11-14 DIAGNOSIS — I10 Essential (primary) hypertension: Secondary | ICD-10-CM | POA: Diagnosis not present

## 2021-11-14 DIAGNOSIS — E119 Type 2 diabetes mellitus without complications: Secondary | ICD-10-CM

## 2021-11-14 DIAGNOSIS — Z7689 Persons encountering health services in other specified circumstances: Secondary | ICD-10-CM

## 2021-11-14 DIAGNOSIS — E7800 Pure hypercholesterolemia, unspecified: Secondary | ICD-10-CM

## 2021-11-14 DIAGNOSIS — E669 Obesity, unspecified: Secondary | ICD-10-CM | POA: Diagnosis not present

## 2021-11-14 LAB — LIPID PANEL
Cholesterol: 110 mg/dL (ref 0–200)
HDL: 47.2 mg/dL (ref >39.00)
LDL Cholesterol: 47 mg/dL (ref 0–99)
NonHDL: 62.36
Total CHOL/HDL Ratio: 2
Triglycerides: 77 mg/dL (ref 0.0–149.0)
VLDL: 15.4 mg/dL (ref 0.0–40.0)

## 2021-11-14 LAB — COMPREHENSIVE METABOLIC PANEL
ALT: 23 U/L (ref 0–35)
AST: 20 U/L (ref 0–37)
Albumin: 4.7 g/dL (ref 3.5–5.2)
Alkaline Phosphatase: 109 U/L (ref 39–117)
BUN: 14 mg/dL (ref 6–23)
CO2: 29 mEq/L (ref 19–32)
Calcium: 10 mg/dL (ref 8.4–10.5)
Chloride: 102 mEq/L (ref 96–112)
Creatinine, Ser: 0.96 mg/dL (ref 0.40–1.20)
GFR: 63.44 mL/min (ref >60.00)
Glucose, Bld: 104 mg/dL — ABNORMAL HIGH (ref 70–99)
Potassium: 4.6 mEq/L (ref 3.5–5.1)
Sodium: 137 mEq/L (ref 135–145)
Total Bilirubin: 0.4 mg/dL (ref 0.2–1.2)
Total Protein: 7.4 g/dL (ref 6.0–8.3)

## 2021-11-14 LAB — HEMOGLOBIN A1C: Hgb A1c MFr Bld: 6.6 % — ABNORMAL HIGH (ref 4.6–6.5)

## 2021-11-14 MED ORDER — ONDANSETRON HCL 8 MG PO TABS: 8.0000 mg | ORAL_TABLET | Freq: Three times a day (TID) | ORAL | 1 refills | Status: DC | PRN

## 2021-11-14 MED ORDER — OZEMPIC (0.25 OR 0.5 MG/DOSE) 2 MG/1.5ML ~~LOC~~ SOPN: 0.2500 mg | PEN_INJECTOR | SUBCUTANEOUS | 0 refills | Status: DC

## 2021-11-14 NOTE — Progress Notes (Signed)
OFFICE VISIT  11/14/2021  CC:  Chief Complaint  Patient presents with   Weight Management    GI doctor recommended pt discuss starting different DM medication. Pt would like to discuss ozempic medication   Patient is a 61 y.o. female who presents for discussion of weight management.  HPI Martha [name] is well today. Pt has BMI 35 with dx of DM, HTN, and HLD. As per general surgery and gastroenterologist the plan as of February 2023 is for laparoscopic hernia repair and TIF (Transoral incisionless fundoplication)--- for treatment of refractory GERD with erosive esophagitis and suspected large hiatal hernia.  We have discussed that it would be ideal to get GiGi started on Ozempic to help her get some preoperative weight loss as well as be beneficial for her diabetes.  She describes a very sensible diet.  She has been walking several afternoons a week for exercise but does not do this on cold days. History of back pain, this has precluded vigorous physical exercise.  She does monitor home glucose periodically and gets numbers typically in the low 100s.  ROS as above, plus--> no fevers, no CP, no SOB, no wheezing, no cough, no dizziness, no HAs, no rashes, no melena/hematochezia.  No polyuria or polydipsia.  No myalgias or arthralgias.  No focal weakness, paresthesias, or tremors.  No acute vision or hearing abnormalities.  No dysuria or unusual/new urinary urgency or frequency.  No recent changes in lower legs. No n/v/d or abd pain.  No palpitations.    Past Medical History:  Diagnosis Date   Allergic rhinitis    Anemia    Arthralgia of multiple sites    Gen rheum lab w/u normal/neg 11/2016 by prior PCP   Constipation    DDD (degenerative disc disease), lumbar 11/2020   R glut/hip pain->ortho, MRI->disc causing pinched nerve at L3-4 level on R, signif dz on L at L4-5 and L5-S1--->ESI on R 11/2020 very helpful, +PT.   Diabetes mellitus ([place]) 2022   Prediab 2017 --Old PCP records state only that her  Hba1c was 6.1% in 2017: she took metformin x ? 6-12 mo, rx'd by wt loss MD.  A1c 6.1% Nov 2019. A1c 6.2% 12/2019. A1c 6.8% 12/2020.   GERD (gastroesophageal reflux disease)    +grade A esophagitis 06/2019 EGD.  Bx neg for eosinophilic esoph. 98/9211 esoph manometry normal, pH probe + abnl reflux not fully suppressed by PPI. PPI BID + H2 blocker as of 08/2019.   History of rectal bleeding    remote past->? rectal ulcer vs perf'd divertic-->need old records for clarif.   Hypercholesterolemia    Past hx of statin use, then was able to come off meds when she lost wt   Hypertension    Insomnia    Joint pain    Palpitations    Right hip pain    MRI showed labral tear, got steroid injection. Also troch bursitis.   Right shoulder pain 05/2020   RC tear, bicipital pathology, AC arthrosis and impingement-->to have surgery    Past Surgical History:  Procedure Laterality Date   71 HOUR PH STUDY N/A 08/05/2019   +GERD. Procedure: [place] STUDY;  Surgeon: Thornton Park, MD;  Location: [facility];  Service: Gastroenterology;  Laterality: N/A;   ABDOMINAL HYSTERECTOMY  2005   for benign dx (No BSO)   BREAST BIOPSY Right 2005   CESAREAN SECTION     x 2   CHOLECYSTECTOMY  2011   COLONOSCOPY  most recent 01/2012   x  2->approx 2011 was done for BRBPR, unclear dx (rectal ulcer? perf'd diverticulum?).  Another about 2015 for abd pain w/u?  No polyps detected on either colonoscopy.  Need old records for clarification (GI MD was Dr. Candiss Nor[place] in [place])--prior pcp records say "colonoscopy 01/2012 negative".   ESOPHAGEAL MANOMETRY N/A 08/05/2019   NORMAL Procedure: ESOPHAGEAL MANOMETRY (EM);  Surgeon: Thornton Park, MD;  Location: [facility];  Service: Gastroenterology;  Laterality: N/A;   ESOPHAGOGASTRODUODENOSCOPY  06/2019     06/2019->reflux esophagitis, o/w normal.   TONSILLECTOMY AND ADENOIDECTOMY  2009    Outpatient Medications Prior to Visit  Medication Sig Dispense Refill   acetaminophen  (TYLENOL) 500 MG tablet Take 1,000 mg by mouth every 6 (six) hours as needed for mild pain, fever or headache.     atorvastatin (LIPITOR) 20 MG tablet Take 1 tablet (20 mg total) by mouth daily. (Patient taking differently: Take 40 mg by mouth daily.) 90 tablet 1   celecoxib (CELEBREX) 100 MG capsule Take 100 mg by mouth 2 (two) times daily.     cholecalciferol (VITAMIN D) 25 MCG (1000 UT) tablet Take 1,000 Units by mouth daily.     esomeprazole (NEXIUM) 40 MG capsule Take 1 capsule (40 mg total) by mouth 2 (two) times daily before a meal. 60 capsule 5   famotidine (PEPCID) 40 MG tablet Take 1 tablet (40 mg total) by mouth 2 (two) times daily. 90 tablet 3   levocetirizine (XYZAL) 5 MG tablet Take 5 mg by mouth daily.      lisinopril (ZESTRIL) 10 MG tablet Take 1 tablet (10 mg total) by mouth daily. 90 tablet 3   metFORMIN (GLUCOPHAGE) 500 MG tablet TAKE 1 TABLET BY MOUTH  TWICE DAILY 180 tablet 1   montelukast (SINGULAIR) 10 MG tablet Take 1 tablet (10 mg total) by mouth at bedtime. 90 tablet 3   Omega-3 Fatty Acids (OMEGA-3 FISH OIL PO) Take 1 capsule by mouth daily.      Probiotic Product (PROBIOTIC PO) Take 1 capsule by mouth daily.     gabapentin (NEURONTIN) 300 MG capsule Take 300 mg by mouth 2 (two) times daily. (Patient not taking: Reported on 11/14/2021)     meloxicam (MOBIC) 15 MG tablet Take 15 mg by mouth daily. (Patient not taking: Reported on 11/14/2021)     traMADol (ULTRAM) 50 MG tablet TAKE 1 TO 2 TABLETS BY MOUTH EVERY 6 HOURS AS NEEDED FOR PAIN (Patient not taking: Reported on 11/14/2021) 30 tablet 0   No facility-administered medications prior to visit.    Allergies  Allergen Reactions   Codeine Anaphylaxis    ROS As per HPI  PE: Vitals with BMI 11/14/2021 10/23/2021 09/19/2021  Height 4' 10.5" 4' 10.5" 4' 10.5"  Weight 167 lbs 6 oz 171 lbs 170 lbs  BMI 34.39 28.36 62.94  Systolic 765 465 035  Diastolic 70 82 74  Pulse 81 80 75     Physical Exam  Gen: Alert, well  appearing.  Patient is oriented to person, place, time, and situation. AFFECT: pleasant, lucid thought and speech. No further exam today.  LABS:  Last CBC Lab Results  Component Value Date   WBC 6.1 01/03/2021   HGB 13.4 01/03/2021   HCT 41.9 01/03/2021   MCV 81.7 01/03/2021   MCH 26.3 (L) 01/04/2020   RDW 17.2 (H) 01/03/2021   PLT 292.0 46/56/8127   Last metabolic panel Lab Results  Component Value Date   GLUCOSE 110 (H) 07/24/2021   NA 140 07/24/2021  K 4.2 07/24/2021   CL 106 07/24/2021   CO2 25 07/24/2021   BUN 16 07/24/2021   CREATININE 0.89 07/24/2021   GFRNONAA 69 05/10/2020   CALCIUM 9.4 07/24/2021   PROT 6.8 07/24/2021   ALBUMIN 4.4 07/24/2021   LABGLOB 2.6 01/04/2020   AGRATIO 1.7 01/04/2020   BILITOT 0.3 07/24/2021   ALKPHOS 86 07/24/2021   AST 18 07/24/2021   ALT 22 07/24/2021   Last lipids Lab Results  Component Value Date   CHOL 165 07/24/2021   HDL 53.40 07/24/2021   LDLCALC 87 07/24/2021   TRIG 122.0 07/24/2021   CHOLHDL 3 07/24/2021   Last hemoglobin A1c Lab Results  Component Value Date   HGBA1C 6.7 (H) 07/24/2021   Last thyroid functions Lab Results  Component Value Date   TSH 2.04 01/03/2021   T3TOTAL 130 01/04/2020   IMPRESSION AND PLAN:  Obesity, BMI 34.  Her comorbidities are type 2 diabetes, hyperlipidemia, and hypertension. She has a large history of erosive esophagitis and refractory GERD, with a suspected large hiatal hernia. Plan is for laparoscopic hernia repair and transoral incision was fundoplication, per patient report to be done in April of this year. The goal is to lose weight prior to the surgery as well as control diabetes. Start Ozempic 0.25 mg subcu weekly.  Therapeutic expectations and side effect profile of medication discussed today.  Patient's questions answered. If glucoses drop below 80 consistently then she is to stop her metformin. Zofran 8 mg, 1 tab 3 times daily as needed prescribed today if nausea is a  significant issue for her. Checking hemoglobin A1c, lipids, and c-Met today.  An After Visit Summary was printed and given to the patient.  FOLLOW UP: Return in about 4 weeks (around 12/12/2021) for f/u wt mgmt/med.  Signed:  Crissie Sickles, MD           11/14/2021

## 2021-11-14 NOTE — Patient Instructions (Signed)
If your sugar drops below 80 consistently then stop your metformin.

## 2021-11-20 ENCOUNTER — Ambulatory Visit: Payer: 59 | Admitting: Obstetrics & Gynecology

## 2021-11-20 ENCOUNTER — Encounter: Payer: Self-pay | Admitting: Family Medicine

## 2021-11-22 ENCOUNTER — Ambulatory Visit: Payer: 59 | Admitting: Family Medicine

## 2021-11-24 NOTE — Telephone Encounter (Signed)
Martha [name] ([name]) Rx #: [id number] Ozempic (0.25 or 0.5 MG/DOSE) 2MG /1.5ML pen-injectors  Request Reference Number: . OZEMPIC INJ 2/1.5ML is approved through 11/21/2022. Your patient may now fill this prescription and it will be covered.

## 2021-12-12 ENCOUNTER — Other Ambulatory Visit: Payer: Self-pay

## 2021-12-12 ENCOUNTER — Telehealth: Payer: Self-pay

## 2021-12-12 DIAGNOSIS — K219 Gastro-esophageal reflux disease without esophagitis: Secondary | ICD-10-CM

## 2021-12-12 DIAGNOSIS — K449 Diaphragmatic hernia without obstruction or gangrene: Secondary | ICD-10-CM

## 2021-12-12 NOTE — Telephone Encounter (Signed)
Left message for Ashley at [facility] to call back.  

## 2021-12-12 NOTE — Telephone Encounter (Signed)
-----   Message from Shellia Cleverly, DO sent at 12/11/2021  4:49 PM EST ----- ?I suppose that is ok to schedule.  ? ?Sheri, Probably more beneficial to the group to open that PM [facility] block up to someone else rather than me schedule 2 cases only then head to [facility].  ? ?Thanks! ? ?----- Message ----- ?From: Missy Sabins, RN ?Sent: 12/11/2021  12:50 PM EST ?To: Shellia Cleverly, DO, Orion Modest, RN ? ?Hi all, ?I received a call from [place] at [facility] to coordinate a cTIF for this patient. They are looking at 02/27/22 in the afternoon. Dr. Andrey Campanile would start at 2:30 pm and you would follow at 3:30 pm. You are scheduled to be in the [facility] all day that day. This is the only available date that Dr. Andrey Campanile will be available in May. They do not currently have the June schedule at this time. Please advise on how you wish to proceed.  ?Thanks, ?[place] ? ? ?

## 2021-12-12 NOTE — Telephone Encounter (Signed)
cTIF scheduled for 02/27/22. Post TIF diet Instructions mailed and sent to pt's mychart. Pt made aware that she will need to be scheduled for a follow up appt 2 weeks after procedure. Let pt know we would contact her when the schedule comes out. Reminder placed in epic. Pt also stated she wanted to cancel EGD scheduled for 3/13 at 9:30 am because Dr. Andrey Campanile told her she didn't need it since she's having the cTIF in May. Procedure canceled.  ?

## 2021-12-18 ENCOUNTER — Encounter: Payer: 59 | Admitting: Gastroenterology

## 2021-12-19 ENCOUNTER — Ambulatory Visit: Payer: 59 | Admitting: Family Medicine

## 2021-12-21 ENCOUNTER — Other Ambulatory Visit: Payer: Self-pay | Admitting: Family Medicine

## 2021-12-21 NOTE — Telephone Encounter (Signed)
Confirmed with pt, she is on her 2nd week of med of 0.25mg  dose.  ? ?

## 2022-01-01 NOTE — Telephone Encounter (Signed)
Pt is scheduled for post cTIF f/u appt on 03/15/22 at 9 am. Pt verbalized understanding and had no other concerns at end of call.  ?

## 2022-01-03 ENCOUNTER — Telehealth: Payer: Self-pay | Admitting: Family Medicine

## 2022-01-03 NOTE — Telephone Encounter (Signed)
Pt had question about ozempic medication ?Next dose is due next Wednesday. ?It takes awhile to get Ozempic from pharmacy, ? ? ?Pt takes meds every Wednesday, ?Pt wants to know does she need a higher dose, since she will be finish soon? ?Her next does is on the April 5th. ? ?Pt cell: [phone] ? ? ?

## 2022-01-03 NOTE — Telephone Encounter (Signed)
Spoke with pt and she took her last dose today. She is scheduled to discuss medication during OV on 4/4. She will be leaving to go out of town for 2 weeks on 4/5. She just wanted to make sure she had medication to take with her so she is not going without. Pharmacy does not always have medication on hand. Advised we would be discussing medication at upcoming appt on 4/4. She voiced understanding.  ?

## 2022-01-03 NOTE — Telephone Encounter (Signed)
noted 

## 2022-01-09 ENCOUNTER — Ambulatory Visit (INDEPENDENT_AMBULATORY_CARE_PROVIDER_SITE_OTHER): Payer: 59 | Admitting: Family Medicine

## 2022-01-09 ENCOUNTER — Encounter: Payer: Self-pay | Admitting: Family Medicine

## 2022-01-09 VITALS — BP 116/70 | HR 88 | Temp 98.3°F | Ht 58.5 in | Wt 166.4 lb

## 2022-01-09 DIAGNOSIS — E669 Obesity, unspecified: Secondary | ICD-10-CM | POA: Diagnosis not present

## 2022-01-09 DIAGNOSIS — Z7689 Persons encountering health services in other specified circumstances: Secondary | ICD-10-CM

## 2022-01-09 MED ORDER — SEMAGLUTIDE (1 MG/DOSE) 4 MG/3ML ~~LOC~~ SOPN: 1.0000 mg | PEN_INJECTOR | SUBCUTANEOUS | 1 refills | Status: DC

## 2022-01-09 NOTE — Progress Notes (Signed)
OFFICE VISIT ? ?01/09/2022 ? ?CC:  ?Chief Complaint  ?Patient presents with  ? Weight Loss  ?  Pt currently on Ozempic  ? ? ?Patient is a 61 y.o. female who presents for 2 mo f/u weight mgmt. ?A/P as of last visit: ?"Obesity, BMI 34.  Her comorbidities are type 2 diabetes, hyperlipidemia, and hypertension. ?She has a large history of erosive esophagitis and refractory GERD, with a suspected large hiatal hernia. ?Plan is for laparoscopic hernia repair and transoral incision was fundoplication, per patient report to be done in April of this year. ?The goal is to lose weight prior to the surgery as well as control diabetes. ?Start Ozempic 0.25 mg subcu weekly.  Therapeutic expectations and side effect profile of medication discussed today.  Patient's questions answered. ?If glucoses drop below 80 consistently then she is to stop her metformin. ?Zofran 8 mg, 1 tab 3 times daily as needed prescribed today if nausea is a significant issue for her. ?Checking hemoglobin A1c, lipids, and c-Met today." ? ?INTERIM HX: ?Feeling well, just quite stressed because she is about to travel to [place] to visit her daughter and take care of her because she is getting a hysterectomy. ? ?No side effects from Ozempic.  She has taken for weekly doses of the 0.5 mg dose, most recent dose 01/03/2022.  She does note significant decreased appetite and decreased p.o. intake.  Home fasting glucoses in the mid 90s to 100 range. ?She is walking for 1 hour every day.  She is going to start with a personal trainer in about a month--- low impact and low intensity. ? ? ?Past Medical History:  ?Diagnosis Date  ? Allergic rhinitis   ? Anemia   ? Arthralgia of multiple sites   ? Gen rheum lab w/u normal/neg 11/2016 by prior PCP  ? Constipation   ? DDD (degenerative disc disease), lumbar 11/2020  ? R glut/hip pain->ortho, MRI->disc causing pinched nerve at L3-4 level on R, signif dz on L at L4-5 and L5-S1--->ESI on R 11/2020 very helpful, +PT.  ? Diabetes  mellitus ([facility]) 2022  ? Prediab 2017 --Old PCP records state only that her Hba1c was 6.1% in 2017: she took metformin x ? 6-12 mo, rx'd by wt loss MD.  A1c 6.1% Nov 2019. A1c 6.2% 12/2019. A1c 6.8% 12/2020.  ? GERD (gastroesophageal reflux disease)   ? +grade A esophagitis 06/2019 EGD.  Bx neg for eosinophilic esoph. 08/2019 esoph manometry normal, pH probe + abnl reflux not fully suppressed by PPI. PPI BID + H2 blocker as of 08/2019.  ? History of rectal bleeding   ? remote past->? rectal ulcer vs perf'd divertic-->need old records for clarif.  ? Hypercholesterolemia   ? Past hx of statin use, then was able to come off meds when she lost wt  ? Hypertension   ? Insomnia   ? Joint pain   ? Palpitations   ? Right hip pain   ? MRI showed labral tear, got steroid injection. Also troch bursitis.  ? Right shoulder pain 05/2020  ? RC tear, bicipital pathology, AC arthrosis and impingement-->to have surgery  ? ? ?Past Surgical History:  ?Procedure Laterality Date  ? 24 HOUR PH STUDY N/A 08/05/2019  ? +GERD. Procedure: 24 HOUR PH STUDY;  Surgeon: Beavers, Kimberly, MD;  Location: [facility];  Service: Gastroenterology;  Laterality: N/A;  ? ABDOMINAL HYSTERECTOMY  2005  ? for benign dx (No BSO)  ? BREAST BIOPSY Right 2005  ? CESAREAN SECTION    ?   x 2  ? CHOLECYSTECTOMY  2011  ? COLONOSCOPY  most recent 01/2012  ? x 2->approx 2011 was done for BRBPR, unclear dx (rectal ulcer? perf'd diverticulum?).  Another about 2015 for abd pain w/u?  No polyps detected on either colonoscopy.  Need old records for clarification (GI MD was Dr. Candiss Norse in [place])--prior pcp records say "colonoscopy 01/2012 negative".  ? ESOPHAGEAL MANOMETRY N/A 08/05/2019  ? NORMAL Procedure: ESOPHAGEAL MANOMETRY (EM);  Surgeon: Thornton Park, MD;  Location: [facility];  Service: Gastroenterology;  Laterality: N/A;  ? ESOPHAGOGASTRODUODENOSCOPY  06/2019  ?   06/2019->reflux esophagitis, o/w normal.  ? TONSILLECTOMY AND ADENOIDECTOMY  2009  ? ? ?Outpatient  Medications Prior to Visit  ?Medication Sig Dispense Refill  ? acetaminophen (TYLENOL) 500 MG tablet Take 1,000 mg by mouth every 6 (six) hours as needed for mild pain, fever or headache.    ? atorvastatin (LIPITOR) 20 MG tablet Take 1 tablet (20 mg total) by mouth daily. (Patient taking differently: Take 40 mg by mouth daily.) 90 tablet 1  ? celecoxib (CELEBREX) 100 MG capsule Take 100 mg by mouth 2 (two) times daily.    ? cholecalciferol (VITAMIN D) 25 MCG (1000 UT) tablet Take 1,000 Units by mouth daily.    ? esomeprazole (NEXIUM) 40 MG capsule Take 1 capsule (40 mg total) by mouth 2 (two) times daily before a meal. 60 capsule 5  ? famotidine (PEPCID) 40 MG tablet Take 1 tablet (40 mg total) by mouth 2 (two) times daily. 90 tablet 3  ? levocetirizine (XYZAL) 5 MG tablet Take 5 mg by mouth daily.     ? lisinopril (ZESTRIL) 10 MG tablet Take 1 tablet (10 mg total) by mouth daily. 90 tablet 3  ? montelukast (SINGULAIR) 10 MG tablet Take 1 tablet (10 mg total) by mouth at bedtime. 90 tablet 3  ? Omega-3 Fatty Acids (OMEGA-3 FISH OIL PO) Take 1 capsule by mouth daily.     ? Probiotic Product (PROBIOTIC PO) Take 1 capsule by mouth daily.    ? metFORMIN (GLUCOPHAGE) 500 MG tablet TAKE 1 TABLET BY MOUTH  TWICE DAILY 180 tablet 1  ? OZEMPIC, 0.25 OR 0.5 MG/DOSE, 2 MG/1.5ML SOPN INJECT 0.25MG INTO THE SKIN ONE TIME PER WEEK 1.5 mL 0  ? gabapentin (NEURONTIN) 300 MG capsule Take 300 mg by mouth 2 (two) times daily. (Patient not taking: Reported on 11/14/2021)    ? meloxicam (MOBIC) 15 MG tablet Take 15 mg by mouth daily. (Patient not taking: Reported on 11/14/2021)    ? ondansetron (ZOFRAN) 8 MG tablet Take 1 tablet (8 mg total) by mouth every 8 (eight) hours as needed for nausea or vomiting. (Patient not taking: Reported on 01/09/2022) 20 tablet 1  ? traMADol (ULTRAM) 50 MG tablet TAKE 1 TO 2 TABLETS BY MOUTH EVERY 6 HOURS AS NEEDED FOR PAIN (Patient not taking: Reported on 11/14/2021) 30 tablet 0  ? ?No facility-administered  medications prior to visit.  ? ? ?Allergies  ?Allergen Reactions  ? Codeine Anaphylaxis  ? ? ?ROS ?As per HPI ? ?PE: ? ?  01/09/2022  ?  9:01 AM 11/14/2021  ?  8:14 AM 10/23/2021  ? 10:22 AM  ?Vitals with BMI  ?Height 4' 10.5" 4' 10.5" 4' 10.5"  ?Weight 166 lbs 6 oz 167 lbs 6 oz 171 lbs  ?BMI 34.18 34.39 35.13  ?Systolic 703 500 938  ?Diastolic 70 70 82  ?Pulse 88 81 80  ? ?Physical Exam ? ?Gen: Alert, well appearing.  Patient is oriented to person, place, time, and situation. ?AFFECT: pleasant, lucid thought and speech. ?No further exam today. ? ?LABS:  ?Last CBC ?Lab Results  ?Component Value Date  ? WBC 6.1 01/03/2021  ? HGB 13.4 01/03/2021  ? HCT 41.9 01/03/2021  ? MCV 81.7 01/03/2021  ? MCH 26.3 (L) 01/04/2020  ? RDW 17.2 (H) 01/03/2021  ? PLT 292.0 01/03/2021  ? ?Last metabolic panel ?Lab Results  ?Component Value Date  ? GLUCOSE 104 (H) 11/14/2021  ? NA 137 11/14/2021  ? K 4.6 11/14/2021  ? CL 102 11/14/2021  ? CO2 29 11/14/2021  ? BUN 14 11/14/2021  ? CREATININE 0.96 11/14/2021  ? GFRNONAA 69 05/10/2020  ? CALCIUM 10.0 11/14/2021  ? PROT 7.4 11/14/2021  ? ALBUMIN 4.7 11/14/2021  ? LABGLOB 2.6 01/04/2020  ? AGRATIO 1.7 01/04/2020  ? BILITOT 0.4 11/14/2021  ? ALKPHOS 109 11/14/2021  ? AST 20 11/14/2021  ? ALT 23 11/14/2021  ? ?Last lipids ?Lab Results  ?Component Value Date  ? CHOL 110 11/14/2021  ? HDL 47.20 11/14/2021  ? LDLCALC 47 11/14/2021  ? TRIG 77.0 11/14/2021  ? CHOLHDL 2 11/14/2021  ? ?Last hemoglobin A1c ?Lab Results  ?Component Value Date  ? HGBA1C 6.6 (H) 11/14/2021  ? ?Last thyroid functions ?Lab Results  ?Component Value Date  ? TSH 2.04 01/03/2021  ? T3TOTAL 130 01/04/2020  ? ?IMPRESSION AND PLAN: ? ?Weight loss management, obesity class I. ?Ozempic started 2 months ago, has titrated up to the 0.5 mg dosing and is ready to transition to the 1 mg weekly dosing--- new prescription sent today.  Tolerating medication well. ?Glucose is very good.  For now we will discontinue metformin. ?No labs needed  today. ?Next A1c after 02/11/2022 ? ?An After Visit Summary was printed and given to the patient. ? ?FOLLOW UP: Return in about 4 weeks (around 02/06/2022) for f/u wt mgmt/med. ? ?Signed:  Crissie Sickles, MD           01/10/19

## 2022-02-06 ENCOUNTER — Ambulatory Visit (INDEPENDENT_AMBULATORY_CARE_PROVIDER_SITE_OTHER): Payer: 59 | Admitting: Family Medicine

## 2022-02-06 ENCOUNTER — Encounter: Payer: Self-pay | Admitting: Family Medicine

## 2022-02-06 VITALS — BP 119/77 | HR 81 | Temp 98.1°F | Ht 58.5 in | Wt 161.2 lb

## 2022-02-06 DIAGNOSIS — E119 Type 2 diabetes mellitus without complications: Secondary | ICD-10-CM

## 2022-02-06 DIAGNOSIS — Z7689 Persons encountering health services in other specified circumstances: Secondary | ICD-10-CM | POA: Diagnosis not present

## 2022-02-06 DIAGNOSIS — E669 Obesity, unspecified: Secondary | ICD-10-CM | POA: Diagnosis not present

## 2022-02-06 LAB — POCT GLYCOSYLATED HEMOGLOBIN (HGB A1C)
HbA1c POC (<> result, manual entry): 5.8 % (ref 4.0–5.6)
HbA1c, POC (controlled diabetic range): 5.8 % (ref 0.0–7.0)
HbA1c, POC (prediabetic range): 5.8 % (ref 5.7–6.4)
Hemoglobin A1C: 5.8 % — AB (ref 4.0–5.6)

## 2022-02-06 MED ORDER — SEMAGLUTIDE (2 MG/DOSE) 8 MG/3ML ~~LOC~~ SOPN
PEN_INJECTOR | SUBCUTANEOUS | 2 refills | Status: DC
Start: 2022-02-06 — End: 2022-02-28

## 2022-02-06 NOTE — Progress Notes (Signed)
OFFICE VISIT ? ?02/06/2022 ? ?CC:  ?Chief Complaint  ?Patient presents with  ? Weight Management  ?F/u DM ? ?Patient is a 61 y.o. female who presents for 1 month follow-up obesity class I, weight management. ? ?INTERIM HX: ?Martha [name] is feeling well and is tolerating the 1 mg/week dose without much problem.  She just has some constipation, takes Dulcolax every couple of days. ?No nausea or abdominal pain. ?She does find that she is able to maintain a good Mediterranean diet. ?She does exercise some but recently had a flare of her left lower back pain due to this.  She was prescribed some prednisone by her orthopedic doctor and she is improved. ?She is in the process of being set up with a sports medicine physical therapist. ? ?Home glucoses around 105 average when fasting.  ? ?Past Medical History:  ?Diagnosis Date  ? Allergic rhinitis   ? Anemia   ? Arthralgia of multiple sites   ? Gen rheum lab w/u normal/neg 11/2016 by prior PCP  ? Constipation   ? DDD (degenerative disc disease), lumbar 11/2020  ? R glut/hip pain->ortho, MRI->disc causing pinched nerve at L3-4 level on R, signif dz on L at L4-5 and L5-S1--->ESI on R 11/2020 very helpful, +PT.  ? Diabetes mellitus ([place]) 2022  ? Prediab 2017 --Old PCP records state only that her Hba1c was 6.1% in 2017: she took metformin x ? 6-12 mo, rx'd by wt loss MD.  A1c 6.1% Nov 2019. A1c 6.2% 12/2019. A1c 6.8% 12/2020.  ? GERD (gastroesophageal reflux disease)   ? +grade A esophagitis 06/2019 EGD.  Bx neg for eosinophilic esoph. [id number] esoph manometry normal, pH probe + abnl reflux not fully suppressed by PPI. PPI BID + H2 blocker as of 08/2019.  ? History of rectal bleeding   ? remote past->? rectal ulcer vs perf'd divertic-->need old records for clarif.  ? Hypercholesterolemia   ? Past hx of statin use, then was able to come off meds when she lost wt  ? Hypertension   ? Insomnia   ? Joint pain   ? Obesity, Class I, BMI 30-34.9   ? Palpitations   ? Right hip pain   ? MRI showed labral  tear, got steroid injection. Also troch bursitis.  ? Right shoulder pain 05/2020  ? RC tear, bicipital pathology, AC arthrosis and impingement-->to have surgery  ? ? ?Past Surgical History:  ?Procedure Laterality Date  ? 75 HOUR [place] STUDY N/A 08/05/2019  ? +GERD. Procedure: [place] STUDY;  Surgeon: Thornton Park, MD;  Location: [facility];  Service: Gastroenterology;  Laterality: N/A;  ? ABDOMINAL HYSTERECTOMY  2005  ? for benign dx (No BSO)  ? BREAST BIOPSY Right 2005  ? CESAREAN SECTION    ? x 2  ? CHOLECYSTECTOMY  2011  ? COLONOSCOPY  most recent 01/2012  ? x 2->approx 2011 was done for BRBPR, unclear dx (rectal ulcer? perf'd diverticulum?).  Another about 2015 for abd pain w/u?  No polyps detected on either colonoscopy.  Need old records for clarification (GI MD was Dr. Candiss Norse in [place])--prior pcp records say "colonoscopy 01/2012 negative".  ? ESOPHAGEAL MANOMETRY N/A 08/05/2019  ? NORMAL Procedure: ESOPHAGEAL MANOMETRY (EM);  Surgeon: Thornton Park, MD;  Location: [facility];  Service: Gastroenterology;  Laterality: N/A;  ? ESOPHAGOGASTRODUODENOSCOPY  06/2019  ?   06/2019->reflux esophagitis, o/w normal.  ? TONSILLECTOMY AND ADENOIDECTOMY  2009  ? ? ?Outpatient Medications Prior to Visit  ?Medication Sig Dispense Refill  ?  acetaminophen (TYLENOL) 500 MG tablet Take 1,000 mg by mouth every 6 (six) hours as needed for mild pain, fever or headache.    ? atorvastatin (LIPITOR) 20 MG tablet Take 1 tablet (20 mg total) by mouth daily. (Patient taking differently: Take 40 mg by mouth daily.) 90 tablet 1  ? celecoxib (CELEBREX) 100 MG capsule Take 100 mg by mouth 2 (two) times daily.    ? cholecalciferol (VITAMIN D) 25 MCG (1000 UT) tablet Take 1,000 Units by mouth daily.    ? esomeprazole (NEXIUM) 40 MG capsule Take 1 capsule (40 mg total) by mouth 2 (two) times daily before a meal. 60 capsule 5  ? famotidine (PEPCID) 40 MG tablet Take 1 tablet (40 mg total) by mouth 2 (two) times daily. 90 tablet 3  ?  gabapentin (NEURONTIN) 300 MG capsule Take 300 mg by mouth 2 (two) times daily.    ? levocetirizine (XYZAL) 5 MG tablet Take 5 mg by mouth daily.     ? lisinopril (ZESTRIL) 10 MG tablet Take 1 tablet (10 mg total) by mouth daily. 90 tablet 3  ? montelukast (SINGULAIR) 10 MG tablet Take 1 tablet (10 mg total) by mouth at bedtime. 90 tablet 3  ? Omega-3 Fatty Acids (OMEGA-3 FISH OIL PO) Take 1 capsule by mouth daily.     ? Probiotic Product (PROBIOTIC PO) Take 1 capsule by mouth daily.    ? Semaglutide, 1 MG/DOSE, 4 MG/3ML SOPN Inject 1 mg as directed once a week. 3 mL 1  ? meloxicam (MOBIC) 15 MG tablet Take 15 mg by mouth 3 (three) times daily. (Patient not taking: Reported on 02/06/2022)    ? ?No facility-administered medications prior to visit.  ? ? ?Allergies  ?Allergen Reactions  ? Codeine Anaphylaxis  ? ? ?ROS ?As per HPI ? ?PE: ? ?  02/06/2022  ? 10:03 AM 01/09/2022  ?  9:01 AM 11/14/2021  ?  8:14 AM  ?Vitals with BMI  ?Height 4' 10.5" 4' 10.5" 4' 10.5"  ?Weight 161 lbs 3 oz 166 lbs 6 oz 167 lbs 6 oz  ?BMI 33.11 34.18 34.39  ?Systolic [id number]  ?Diastolic 77 70 70  ?Pulse 81 88 81  ? ? ? ?Physical Exam ? ?Gen: Alert, well appearing.  Patient is oriented to person, place, time, and situation. ?AFFECT: pleasant, lucid thought and speech. ?No further exam today. ? ?LABS:  ?Last CBC ?Lab Results  ?Component Value Date  ? WBC 6.1 01/03/2021  ? HGB 13.4 01/03/2021  ? HCT 41.9 01/03/2021  ? MCV 81.7 01/03/2021  ? MCH 26.3 (L) 01/04/2020  ? RDW 17.2 (H) 01/03/2021  ? PLT 292.0 01/03/2021  ? ?Last metabolic panel ?Lab Results  ?Component Value Date  ? GLUCOSE 104 (H) 11/14/2021  ? NA 137 11/14/2021  ? K 4.6 11/14/2021  ? CL 102 11/14/2021  ? CO2 29 11/14/2021  ? BUN 14 11/14/2021  ? CREATININE 0.96 11/14/2021  ? GFRNONAA 69 05/10/2020  ? CALCIUM 10.0 11/14/2021  ? PROT 7.4 11/14/2021  ? ALBUMIN 4.7 11/14/2021  ? LABGLOB 2.6 01/04/2020  ? AGRATIO 1.7 01/04/2020  ? BILITOT 0.4 11/14/2021  ? ALKPHOS 109 11/14/2021  ? AST 20  11/14/2021  ? ALT 23 11/14/2021  ? ?Last lipids ?Lab Results  ?Component Value Date  ? CHOL 110 11/14/2021  ? HDL 47.20 11/14/2021  ? LDLCALC 47 11/14/2021  ? TRIG 77.0 11/14/2021  ? CHOLHDL 2 11/14/2021  ? ?Last hemoglobin A1c ?Lab Results  ?Component Value  Date  ? HGBA1C 5.8 (A) 02/06/2022  ? HGBA1C 5.8 02/06/2022  ? HGBA1C 5.8 02/06/2022  ? HGBA1C 5.8 02/06/2022  ? ?IMPRESSION AND PLAN: ? ?#1 type 2 diabetes.  Tolerating Ozempic 1 mg weekly very well. ?Home glucose is excellent. ?Hemoglobin A1c 5.8% today! ?Discussed options today and we ended up deciding to increase the Ozempic to 2 mg subcu weekly (wt mgmt). ? ?I recommended she add a daily dose of Senokot to help with the constipation the Ozempic is causing.  She can still use Dulcolax as needed. ? ?An After Visit Summary was printed and given to the patient. ? ?FOLLOW UP: Return in about 2 months (around 04/08/2022) for routine chronic illness f/u. ? ?Signed:  Crissie Sickles, MD           02/06/2022 ? ?

## 2022-02-09 ENCOUNTER — Other Ambulatory Visit (HOSPITAL_COMMUNITY): Payer: 59

## 2022-02-14 ENCOUNTER — Other Ambulatory Visit: Payer: Self-pay | Admitting: Family Medicine

## 2022-02-21 NOTE — Patient Instructions (Addendum)
DUE TO COVID-19 ONLY TWO VISITORS  (aged 63 and older)  ARE ALLOWED TO COME WITH YOU AND STAY IN THE WAITING ROOM ONLY DURING PRE OP AND PROCEDURE.   **NO VISITORS ARE ALLOWED IN THE SHORT STAY AREA OR RECOVERY ROOM!!**  IF YOU WILL BE ADMITTED INTO THE HOSPITAL YOU ARE ALLOWED ONLY FOUR SUPPORT PEOPLE DURING VISITATION HOURS ONLY (7 AM -8PM)   The support person(s) must pass our screening, gel in and out, and wear a mask at all times, including in the patient's room. Patients must also wear a mask when staff or their support person are in the room. Visitors GUEST BADGE MUST BE WORN VISIBLY  One adult visitor may remain with you overnight and MUST be in the room by 8 P.M.     Your procedure is scheduled on: 02/27/22   Report to [facility] Main Entrance    Report to admitting at : 12:15 PM   Call this number if you have problems the morning of surgery [phone]   Do not eat food :After Midnight.   After Midnight you may have the following liquids until : 11:30 AM DAY OF SURGERY  Water Black Coffee (sugar ok, NO MILK/CREAM OR CREAMERS)  Tea (sugar ok, NO MILK/CREAM OR CREAMERS) regular and decaf                             Plain Jell-O (NO RED)                                           Fruit ices (not with fruit pulp, NO RED)                                     Popsicles (NO RED)                                                                  Juice: apple, WHITE grape, WHITE cranberry Sports drinks like Gatorade (NO RED) Clear broth(vegetable,chicken,beef)  FOLLOW BOWEL PREP AND ANY ADDITIONAL PRE OP INSTRUCTIONS YOU RECEIVED FROM YOUR SURGEON'S OFFICE!!!     Oral Hygiene is also important to reduce your risk of infection.                                    Remember - BRUSH YOUR TEETH THE MORNING OF SURGERY WITH YOUR REGULAR TOOTHPASTE   Do NOT smoke after Midnight   Take these medicines the morning of surgery with A SIP OF WATER: gabapentin,levocetirizine.Tylenol  as needed. How to Manage Your Diabetes Before and After Surgery  Why is it important to control my blood sugar before and after surgery? Improving blood sugar levels before and after surgery helps healing and can limit problems. A way of improving blood sugar control is eating a healthy diet by:  Eating less sugar and carbohydrates  Increasing activity/exercise  Talking with your doctor about reaching your blood sugar goals High blood sugars (greater  than 180 mg/dL) can raise your risk of infections and slow your recovery, so you will need to focus on controlling your diabetes during the weeks before surgery. Make sure that the doctor who takes care of your diabetes knows about your planned surgery including the date and location.  How do I manage my blood sugar before surgery? Check your blood sugar at least 4 times a day, starting 2 days before surgery, to make sure that the level is not too high or low. Check your blood sugar the morning of your surgery when you wake up and every 2 hours until you get to the Short Stay unit. If your blood sugar is less than 70 mg/dL, you will need to treat for low blood sugar: Do not take insulin. Treat a low blood sugar (less than 70 mg/dL) with  cup of clear juice (cranberry or apple), 4 glucose tablets, OR glucose gel. Recheck blood sugar in 15 minutes after treatment (to make sure it is greater than 70 mg/dL). If your blood sugar is not greater than 70 mg/dL on recheck, call [phone] for further instructions. Report your blood sugar to the short stay nurse when you get to Short Stay.  If you are admitted to the hospital after surgery: Your blood sugar will be checked by the staff and you will probably be given insulin after surgery (instead of oral diabetes medicines) to make sure you have good blood sugar levels. The goal for blood sugar control after surgery is 80-180 mg/dL.   WHAT DO I DO ABOUT MY DIABETES MEDICATION?  Do not take oral  diabetes medicines (pills) the morning of surgery.  THE DAY BEFORE SURGERY, take semaglutide as usual.       THE MORNING OF SURGERY, DO NOT take semaglutide.  DO NOT TAKE ANY ORAL DIABETIC MEDICATIONS DAY OF YOUR SURGERY  Bring CPAP mask and tubing day of surgery.                              You may not have any metal on your body including hair pins, jewelry, and body piercing             Do not wear make-up, lotions, powders, perfumes/cologne, or deodorant  Do not wear nail polish including gel and S&S, artificial/acrylic nails, or any other type of covering on natural nails including finger and toenails. If you have artificial nails, gel coating, etc. that needs to be removed by a nail salon please have this removed prior to surgery or surgery may need to be canceled/ delayed if the surgeon/ anesthesia feels like they are unable to be safely monitored.   Do not shave  48 hours prior to surgery.    Do not bring valuables to the hospital. [place] IS NOT             RESPONSIBLE   FOR VALUABLES.   Contacts, dentures or bridgework may not be worn into surgery.   Bring small overnight bag day of surgery.    Patients discharged on the day of surgery will not be allowed to drive home.  Someone NEEDS to stay with you for the first 24 hours after anesthesia.   Special Instructions: Bring a copy of your healthcare power of attorney and living will documents         the day of surgery if you haven't scanned them before.  Please read over the following fact sheets you were given: IF YOU HAVE QUESTIONS ABOUT YOUR PRE-OP INSTRUCTIONS PLEASE CALL [phone]     [facility] - Preparing for Surgery Before surgery, you can play an important role.  Because skin is not sterile, your skin needs to be as free of germs as possible.  You can reduce the number of germs on your skin by washing with CHG (chlorahexidine gluconate) soap before surgery.  CHG is an antiseptic cleaner which  kills germs and bonds with the skin to continue killing germs even after washing. Please DO NOT use if you have an allergy to CHG or antibacterial soaps.  If your skin becomes reddened/irritated stop using the CHG and inform your nurse when you arrive at Short Stay. Do not shave (including legs and underarms) for at least 48 hours prior to the first CHG shower.  You may shave your face/neck. Please follow these instructions carefully:  1.  Shower with CHG Soap the night before surgery and the  morning of Surgery.  2.  If you choose to wash your hair, wash your hair first as usual with your  normal  shampoo.  3.  After you shampoo, rinse your hair and body thoroughly to remove the  shampoo.                           4.  Use CHG as you would any other liquid soap.  You can apply chg directly  to the skin and wash                       Gently with a scrungie or clean washcloth.  5.  Apply the CHG Soap to your body ONLY FROM THE NECK DOWN.   Do not use on face/ open                           Wound or open sores. Avoid contact with eyes, ears mouth and genitals (private parts).                       Wash face,  Genitals (private parts) with your normal soap.             6.  Wash thoroughly, paying special attention to the area where your surgery  will be performed.  7.  Thoroughly rinse your body with warm water from the neck down.  8.  DO NOT shower/wash with your normal soap after using and rinsing off  the CHG Soap.                9.  Pat yourself dry with a clean towel.            10.  Wear clean pajamas.            11.  Place clean sheets on your bed the night of your first shower and do not  sleep with pets. Day of Surgery : Do not apply any lotions/deodorants the morning of surgery.  Please wear clean clothes to the hospital/surgery center.  FAILURE TO FOLLOW THESE INSTRUCTIONS MAY RESULT IN THE CANCELLATION OF YOUR SURGERY PATIENT SIGNATURE_________________________________  NURSE  SIGNATURE__________________________________  ________________________________________________________________________

## 2022-02-21 NOTE — Progress Notes (Signed)
Pt. Needs orders for upcoming surgery. ?

## 2022-02-22 ENCOUNTER — Encounter (HOSPITAL_COMMUNITY): Payer: Self-pay

## 2022-02-22 ENCOUNTER — Encounter (HOSPITAL_COMMUNITY)
Admission: RE | Admit: 2022-02-22 | Discharge: 2022-02-22 | Disposition: A | Discharge status: Home / Self Care | Payer: 59 | Source: Ambulatory Visit | Attending: General Surgery | Admitting: General Surgery

## 2022-02-22 VITALS — HR 80 | Temp 98.1°F | Ht 58.5 in | Wt 160.0 lb

## 2022-02-22 DIAGNOSIS — R7303 Prediabetes: Secondary | ICD-10-CM | POA: Insufficient documentation

## 2022-02-22 DIAGNOSIS — Z01818 Encounter for other preprocedural examination: Secondary | ICD-10-CM | POA: Diagnosis present

## 2022-02-22 DIAGNOSIS — I10 Essential (primary) hypertension: Secondary | ICD-10-CM | POA: Diagnosis not present

## 2022-02-22 LAB — BASIC METABOLIC PANEL
Anion gap: 8 (ref 5–15)
BUN: 17 mg/dL (ref 8–23)
CO2: 24 mmol/L (ref 22–32)
Calcium: 9.5 mg/dL (ref 8.9–10.3)
Chloride: 109 mmol/L (ref 98–111)
Creatinine, Ser: 1.12 mg/dL — ABNORMAL HIGH (ref 0.44–1.00)
GFR, Estimated: 56 mL/min — ABNORMAL LOW (ref >60)
Glucose, Bld: 101 mg/dL — ABNORMAL HIGH (ref 70–99)
Potassium: 4.3 mmol/L (ref 3.5–5.1)
Sodium: 141 mmol/L (ref 135–145)

## 2022-02-22 LAB — CBC
HCT: 39.7 % (ref 36.0–46.0)
Hemoglobin: 12.1 g/dL (ref 12.0–15.0)
MCH: 26.3 pg (ref 26.0–34.0)
MCHC: 30.5 g/dL (ref 30.0–36.0)
MCV: 86.3 fL (ref 80.0–100.0)
Platelets: 305 10*3/uL (ref 150–400)
RBC: 4.6 MIL/uL (ref 3.87–5.11)
RDW: 15.6 % — ABNORMAL HIGH (ref 11.5–15.5)
WBC: 7.1 10*3/uL (ref 4.0–10.5)
nRBC: 0 % (ref 0.0–0.2)

## 2022-02-22 LAB — GLUCOSE, CAPILLARY: Glucose-Capillary: 103 mg/dL — ABNORMAL HIGH (ref 70–99)

## 2022-02-22 NOTE — Progress Notes (Signed)
  Bowel prep reminder:  PCP - Dr. Nicoletta Ba Cardiologist -   Chest x-ray -  EKG -  Stress Test -  ECHO -  Cardiac Cath -  Pacemaker/ICD device last checked:  Sleep Study -  CPAP -   Fasting Blood Sugar - 100's Checks Blood Sugar __1___ times a day A1C: 02/06/22: 5.8 Blood Thinner Instructions: Aspirin Instructions: Last Dose:  Anesthesia review: Hx: HTN,DIA  Patient denies shortness of breath, fever, cough and chest pain at PAT appointment   Patient verbalized understanding of instructions that were given to them at the PAT appointment. Patient was also instructed that they will need to review over the PAT instructions again at home before surgery.

## 2022-02-26 ENCOUNTER — Telehealth: Payer: Self-pay | Admitting: Gastroenterology

## 2022-02-26 NOTE — Telephone Encounter (Signed)
Received a call from Gerlene Fee, Operative Services at [facility], regarding patient's procedure tomorrow.  She needs information regarding the appointment as she does all the authorizations for procedures for revenue purposes.  It is important that you call her back asap.  Thank you.  Phone number [phone].

## 2022-02-26 NOTE — Telephone Encounter (Signed)
Returned call and Martha [name] said there was no CPT code for cTIF tomorrow. Let Martha [name] know that there is not a new CPT code for TIF portion. Martha [name] also stated that she didn't see where the TIF procedure had been authorized. Told her that [facility] does the authorization for the combined hiatal hernia repair and TIF procedure and she could contact them with questions about authorization.

## 2022-02-27 ENCOUNTER — Encounter (HOSPITAL_COMMUNITY)
Admission: RE | Disposition: A | Discharge status: Home / Self Care | Payer: Self-pay | Source: Ambulatory Visit | Attending: Gastroenterology

## 2022-02-27 ENCOUNTER — Ambulatory Visit (HOSPITAL_BASED_OUTPATIENT_CLINIC_OR_DEPARTMENT_OTHER): Payer: 59 | Admitting: Anesthesiology

## 2022-02-27 ENCOUNTER — Ambulatory Visit (HOSPITAL_COMMUNITY): Payer: 59 | Admitting: Anesthesiology

## 2022-02-27 ENCOUNTER — Other Ambulatory Visit: Payer: Self-pay

## 2022-02-27 ENCOUNTER — Encounter (HOSPITAL_COMMUNITY): Payer: Self-pay | Admitting: General Surgery

## 2022-02-27 ENCOUNTER — Observation Stay (HOSPITAL_COMMUNITY)
Admission: RE | Admit: 2022-02-27 | Discharge: 2022-02-28 | Disposition: A | Discharge status: Home / Self Care | Payer: 59 | Source: Ambulatory Visit | Attending: Gastroenterology | Admitting: Gastroenterology

## 2022-02-27 DIAGNOSIS — K449 Diaphragmatic hernia without obstruction or gangrene: Secondary | ICD-10-CM

## 2022-02-27 DIAGNOSIS — Z79899 Other long term (current) drug therapy: Secondary | ICD-10-CM | POA: Insufficient documentation

## 2022-02-27 DIAGNOSIS — K2100 Gastro-esophageal reflux disease with esophagitis, without bleeding: Secondary | ICD-10-CM | POA: Diagnosis not present

## 2022-02-27 DIAGNOSIS — K219 Gastro-esophageal reflux disease without esophagitis: Secondary | ICD-10-CM

## 2022-02-27 DIAGNOSIS — I10 Essential (primary) hypertension: Secondary | ICD-10-CM | POA: Diagnosis not present

## 2022-02-27 DIAGNOSIS — E119 Type 2 diabetes mellitus without complications: Secondary | ICD-10-CM | POA: Diagnosis not present

## 2022-02-27 HISTORY — PX: TRANSORAL INCISIONLESS FUNDOPLICATION: SHX6840

## 2022-02-27 HISTORY — PX: HIATAL HERNIA REPAIR: SHX195

## 2022-02-27 HISTORY — PX: ESOPHAGOGASTRODUODENOSCOPY: SHX5428

## 2022-02-27 LAB — GLUCOSE, CAPILLARY
Glucose-Capillary: 97 mg/dL (ref 70–99)
Glucose-Capillary: 142 mg/dL — ABNORMAL HIGH (ref 70–99)

## 2022-02-27 SURGERY — REPAIR, HERNIA, HIATAL, LAPAROSCOPIC
Anesthesia: General

## 2022-02-27 MED ORDER — BUPIVACAINE LIPOSOME 1.3 % IJ SUSP
INTRAMUSCULAR | Status: AC
  Filled 2022-02-27: qty 20

## 2022-02-27 MED ORDER — ACETAMINOPHEN 500 MG PO TABS: 1000.0000 mg | ORAL_TABLET | Freq: Four times a day (QID) | ORAL | Status: DC | PRN

## 2022-02-27 MED ORDER — BUPIVACAINE-EPINEPHRINE (PF) 0.25% -1:200000 IJ SOLN
INTRAMUSCULAR | Status: AC
  Filled 2022-02-27: qty 30

## 2022-02-27 MED ORDER — MIDAZOLAM HCL 5 MG/5ML IJ SOLN
INTRAMUSCULAR | Status: DC | PRN
  Administered 2022-02-27: 2 mg via INTRAVENOUS

## 2022-02-27 MED ORDER — CEFAZOLIN SODIUM-DEXTROSE 2-4 GM/100ML-% IV SOLN
2.0000 g | Freq: Once | INTRAVENOUS | Status: AC
  Administered 2022-02-27: 2 g via INTRAVENOUS
  Filled 2022-02-27: qty 100

## 2022-02-27 MED ORDER — ACETAMINOPHEN 10 MG/ML IV SOLN
INTRAVENOUS | Status: DC | PRN
  Administered 2022-02-27: 1000 mg via INTRAVENOUS

## 2022-02-27 MED ORDER — SODIUM CHLORIDE 0.9 % IV SOLN: INTRAVENOUS | Status: DC

## 2022-02-27 MED ORDER — METOCLOPRAMIDE HCL 5 MG/ML IJ SOLN
10.0000 mg | Freq: Four times a day (QID) | INTRAMUSCULAR | Status: DC
  Administered 2022-02-27 – 2022-02-28 (×3): 10 mg via INTRAVENOUS
  Filled 2022-02-27 (×3): qty 2

## 2022-02-27 MED ORDER — MIDAZOLAM HCL 2 MG/2ML IJ SOLN
INTRAMUSCULAR | Status: AC
  Filled 2022-02-27: qty 2

## 2022-02-27 MED ORDER — SUGAMMADEX SODIUM 200 MG/2ML IV SOLN
INTRAVENOUS | Status: DC | PRN
  Administered 2022-02-27: 200 mg via INTRAVENOUS

## 2022-02-27 MED ORDER — ORAL CARE MOUTH RINSE: 15.0000 mL | Freq: Once | OROMUCOSAL | Status: AC

## 2022-02-27 MED ORDER — TRAMADOL HCL 50 MG PO TABS
50.0000 mg | ORAL_TABLET | Freq: Three times a day (TID) | ORAL | Status: DC | PRN
  Administered 2022-02-27: 50 mg via ORAL
  Filled 2022-02-27: qty 1

## 2022-02-27 MED ORDER — LIDOCAINE VISCOUS HCL 2 % MT SOLN
5.0000 mL | Freq: Three times a day (TID) | OROMUCOSAL | Status: DC | PRN
  Filled 2022-02-27: qty 15

## 2022-02-27 MED ORDER — GABAPENTIN 250 MG/5ML PO SOLN
300.0000 mg | ORAL | Status: DC
  Administered 2022-02-27: 300 mg via ORAL
  Filled 2022-02-27: qty 6

## 2022-02-27 MED ORDER — HEPARIN SODIUM (PORCINE) 5000 UNIT/ML IJ SOLN
5000.0000 [IU] | Freq: Three times a day (TID) | INTRAMUSCULAR | Status: DC
  Administered 2022-02-27 – 2022-02-28 (×3): 5000 [IU] via SUBCUTANEOUS
  Filled 2022-02-27 (×3): qty 1

## 2022-02-27 MED ORDER — LIDOCAINE HCL (PF) 2 % IJ SOLN
INTRAMUSCULAR | Status: AC
  Filled 2022-02-27: qty 10

## 2022-02-27 MED ORDER — OXYCODONE HCL 5 MG/5ML PO SOLN: 5.0000 mg | ORAL | Status: DC | PRN

## 2022-02-27 MED ORDER — FAMOTIDINE IN NACL 20-0.9 MG/50ML-% IV SOLN
20.0000 mg | Freq: Once | INTRAVENOUS | Status: AC
  Administered 2022-02-27: 20 mg via INTRAVENOUS
  Filled 2022-02-27 (×2): qty 50

## 2022-02-27 MED ORDER — DEXAMETHASONE SODIUM PHOSPHATE 10 MG/ML IJ SOLN
INTRAMUSCULAR | Status: DC | PRN
  Administered 2022-02-27: 4 mg via INTRAVENOUS

## 2022-02-27 MED ORDER — ONDANSETRON HCL 4 MG/2ML IJ SOLN
4.0000 mg | Freq: Once | INTRAMUSCULAR | Status: DC
  Filled 2022-02-27: qty 2

## 2022-02-27 MED ORDER — SCOPOLAMINE 1 MG/3DAYS TD PT72
1.0000 | MEDICATED_PATCH | Freq: Once | TRANSDERMAL | Status: DC
  Administered 2022-02-27: 1.5 mg via TRANSDERMAL
  Filled 2022-02-27: qty 1

## 2022-02-27 MED ORDER — ACETAMINOPHEN 10 MG/ML IV SOLN
INTRAVENOUS | Status: AC
  Filled 2022-02-27: qty 100

## 2022-02-27 MED ORDER — FENTANYL CITRATE (PF) 100 MCG/2ML IJ SOLN
INTRAMUSCULAR | Status: AC
  Filled 2022-02-27: qty 2

## 2022-02-27 MED ORDER — ONDANSETRON HCL 4 MG/2ML IJ SOLN
INTRAMUSCULAR | Status: AC
  Filled 2022-02-27: qty 2

## 2022-02-27 MED ORDER — SENNA 8.6 MG PO TABS
1.0000 | ORAL_TABLET | Freq: Every day | ORAL | Status: DC
  Administered 2022-02-27: 8.6 mg via ORAL
  Filled 2022-02-27: qty 1

## 2022-02-27 MED ORDER — PROPOFOL 10 MG/ML IV BOLUS
INTRAVENOUS | Status: AC
  Filled 2022-02-27: qty 20

## 2022-02-27 MED ORDER — PHENYLEPHRINE 80 MCG/ML (10ML) SYRINGE FOR IV PUSH (FOR BLOOD PRESSURE SUPPORT)
PREFILLED_SYRINGE | INTRAVENOUS | Status: AC
  Filled 2022-02-27: qty 10

## 2022-02-27 MED ORDER — DEXAMETHASONE SODIUM PHOSPHATE 10 MG/ML IJ SOLN
8.0000 mg | Freq: Four times a day (QID) | INTRAMUSCULAR | Status: DC
  Administered 2022-02-27 – 2022-02-28 (×3): 8 mg via INTRAVENOUS
  Filled 2022-02-27 (×3): qty 1

## 2022-02-27 MED ORDER — ONDANSETRON HCL 4 MG/2ML IJ SOLN
4.0000 mg | Freq: Four times a day (QID) | INTRAMUSCULAR | Status: DC
  Administered 2022-02-27 – 2022-02-28 (×3): 4 mg via INTRAVENOUS
  Filled 2022-02-27 (×3): qty 2

## 2022-02-27 MED ORDER — DEXAMETHASONE SODIUM PHOSPHATE 10 MG/ML IJ SOLN
INTRAMUSCULAR | Status: AC
  Filled 2022-02-27: qty 1

## 2022-02-27 MED ORDER — OXYCODONE HCL 5 MG PO TABS: 5.0000 mg | ORAL_TABLET | Freq: Once | ORAL | Status: DC | PRN

## 2022-02-27 MED ORDER — LEVOCETIRIZINE DIHYDROCHLORIDE 5 MG PO TABS: 5.0000 mg | ORAL_TABLET | Freq: Every day | ORAL | Status: DC

## 2022-02-27 MED ORDER — LACTATED RINGERS IV SOLN: INTRAVENOUS | Status: DC

## 2022-02-27 MED ORDER — LACTATED RINGERS IR SOLN
Status: DC | PRN
Start: 2022-02-27 — End: 2022-02-27
  Administered 2022-02-27: 1000 mL

## 2022-02-27 MED ORDER — OXYCODONE HCL 5 MG/5ML PO SOLN: 5.0000 mg | Freq: Once | ORAL | Status: DC | PRN

## 2022-02-27 MED ORDER — LIDOCAINE 2% (20 MG/ML) 5 ML SYRINGE
INTRAMUSCULAR | Status: DC | PRN
  Administered 2022-02-27: 100 mg via INTRAVENOUS

## 2022-02-27 MED ORDER — ONDANSETRON HCL 4 MG/2ML IJ SOLN
INTRAMUSCULAR | Status: DC | PRN
  Administered 2022-02-27: 4 mg via INTRAVENOUS

## 2022-02-27 MED ORDER — LORATADINE 10 MG PO TABS: 10.0000 mg | ORAL_TABLET | Freq: Every day | ORAL | Status: DC

## 2022-02-27 MED ORDER — ACETAMINOPHEN 10 MG/ML IV SOLN: 1000.0000 mg | Freq: Once | INTRAVENOUS | Status: DC | PRN

## 2022-02-27 MED ORDER — BUPIVACAINE-EPINEPHRINE 0.25% -1:200000 IJ SOLN
INTRAMUSCULAR | Status: DC | PRN
  Administered 2022-02-27: 30 mL

## 2022-02-27 MED ORDER — 0.9 % SODIUM CHLORIDE (POUR BTL) OPTIME
TOPICAL | Status: DC | PRN
  Administered 2022-02-27: 1000 mL

## 2022-02-27 MED ORDER — PHENYLEPHRINE 80 MCG/ML (10ML) SYRINGE FOR IV PUSH (FOR BLOOD PRESSURE SUPPORT)
PREFILLED_SYRINGE | INTRAVENOUS | Status: DC | PRN
  Administered 2022-02-27 (×6): 80 ug via INTRAVENOUS

## 2022-02-27 MED ORDER — SIMETHICONE 80 MG PO CHEW: 80.0000 mg | CHEWABLE_TABLET | Freq: Four times a day (QID) | ORAL | Status: DC | PRN

## 2022-02-27 MED ORDER — HYDROMORPHONE HCL 1 MG/ML IJ SOLN: 0.2500 mg | INTRAMUSCULAR | Status: DC | PRN

## 2022-02-27 MED ORDER — ACETAMINOPHEN 160 MG/5ML PO SOLN: 1000.0000 mg | Freq: Four times a day (QID) | ORAL | Status: DC | PRN

## 2022-02-27 MED ORDER — LIDOCAINE HCL (PF) 2 % IJ SOLN
INTRAMUSCULAR | Status: DC | PRN
  Administered 2022-02-27: 1.198 mg/kg/h via INTRADERMAL

## 2022-02-27 MED ORDER — CHLORHEXIDINE GLUCONATE 0.12 % MT SOLN
15.0000 mL | Freq: Once | OROMUCOSAL | Status: AC
  Administered 2022-02-27: 15 mL via OROMUCOSAL

## 2022-02-27 MED ORDER — PANTOPRAZOLE SODIUM 40 MG PO TBEC
40.0000 mg | DELAYED_RELEASE_TABLET | Freq: Two times a day (BID) | ORAL | Status: DC
  Administered 2022-02-27: 40 mg via ORAL
  Filled 2022-02-27: qty 1

## 2022-02-27 MED ORDER — FENTANYL CITRATE (PF) 250 MCG/5ML IJ SOLN
INTRAMUSCULAR | Status: AC
  Filled 2022-02-27: qty 5

## 2022-02-27 MED ORDER — BUPIVACAINE LIPOSOME 1.3 % IJ SUSP
INTRAMUSCULAR | Status: DC | PRN
  Administered 2022-02-27: 20 mL

## 2022-02-27 MED ORDER — GABAPENTIN 300 MG PO CAPS: 300.0000 mg | ORAL_CAPSULE | ORAL | Status: DC

## 2022-02-27 MED ORDER — FENTANYL CITRATE (PF) 100 MCG/2ML IJ SOLN
INTRAMUSCULAR | Status: DC | PRN
  Administered 2022-02-27 (×5): 50 ug via INTRAVENOUS
  Administered 2022-02-27: 100 ug via INTRAVENOUS

## 2022-02-27 MED ORDER — ROCURONIUM BROMIDE 10 MG/ML (PF) SYRINGE
PREFILLED_SYRINGE | INTRAVENOUS | Status: DC | PRN
  Administered 2022-02-27: 80 mg via INTRAVENOUS

## 2022-02-27 MED ORDER — ONDANSETRON HCL 4 MG/2ML IJ SOLN: 4.0000 mg | Freq: Once | INTRAMUSCULAR | Status: DC | PRN

## 2022-02-27 MED ORDER — PROPOFOL 10 MG/ML IV BOLUS
INTRAVENOUS | Status: DC | PRN
  Administered 2022-02-27: 200 mg via INTRAVENOUS

## 2022-02-27 SURGICAL SUPPLY — 57 items
APPLIER CLIP ROT 10 11.4 M/L (STAPLE)
BAG COUNTER SPONGE SURGICOUNT (BAG) IMPLANT
BENZOIN TINCTURE PRP APPL 2/3 (GAUZE/BANDAGES/DRESSINGS) IMPLANT
CABLE HIGH FREQUENCY MONO STRZ (ELECTRODE) IMPLANT
CHLORAPREP W/TINT 26 (MISCELLANEOUS) ×2 IMPLANT
CLIP APPLIE ROT 10 11.4 M/L (STAPLE) IMPLANT
DERMABOND ADVANCED (GAUZE/BANDAGES/DRESSINGS) ×1
DERMABOND ADVANCED .7 DNX12 (GAUZE/BANDAGES/DRESSINGS) IMPLANT
DEVICE SUT QUICK LOAD TK 5 (STAPLE) ×2 IMPLANT
DEVICE SUT TI-KNOT TK 5X26 (MISCELLANEOUS) ×1 IMPLANT
DEVICE SUTURE ENDOST 10MM (ENDOMECHANICALS) ×2 IMPLANT
DISSECTOR BLUNT TIP ENDO 5MM (MISCELLANEOUS) ×2 IMPLANT
DRAIN PENROSE 0.5X18 (DRAIN) ×2 IMPLANT
ELECT L-HOOK LAP 45CM DISP (ELECTROSURGICAL)
ELECT REM PT RETURN 15FT ADLT (MISCELLANEOUS) ×2 IMPLANT
ELECTRODE L-HOOK LAP 45CM DISP (ELECTROSURGICAL) ×1 IMPLANT
GLOVE BIO SURGEON STRL SZ7.5 (GLOVE) ×2 IMPLANT
GLOVE BIOGEL PI IND STRL 7.0 (GLOVE) IMPLANT
GLOVE BIOGEL PI IND STRL 8 (GLOVE) ×1 IMPLANT
GLOVE BIOGEL PI INDICATOR 7.0 (GLOVE)
GLOVE BIOGEL PI INDICATOR 8 (GLOVE) ×1
GLOVE BIOGEL PI ORTHO PRO 7.5 (GLOVE) ×1
GLOVE PI ORTHO PRO STRL 7.5 (GLOVE) ×1 IMPLANT
GOWN STRL REUS W/ TWL XL LVL3 (GOWN DISPOSABLE) ×3 IMPLANT
GOWN STRL REUS W/TWL XL LVL3 (GOWN DISPOSABLE) ×3
GRASPER SUT TROCAR 14GX15 (MISCELLANEOUS) ×2 IMPLANT
IRRIG SUCT STRYKERFLOW 2 WTIP (MISCELLANEOUS) ×2
IRRIGATION SUCT STRKRFLW 2 WTP (MISCELLANEOUS) ×1 IMPLANT
KIT BASIN OR (CUSTOM PROCEDURE TRAY) ×2 IMPLANT
KIT ESOPHYX Z+ (Miscellaneous) ×1 IMPLANT
KIT TURNOVER KIT A (KITS) IMPLANT
NS IRRIG 1000ML POUR BTL (IV SOLUTION) ×2 IMPLANT
PACK UNIVERSAL I (CUSTOM PROCEDURE TRAY) ×2 IMPLANT
PENCIL SMOKE EVACUATOR (MISCELLANEOUS) IMPLANT
SCISSORS LAP 5X45 EPIX DISP (ENDOMECHANICALS) ×2 IMPLANT
SET TUBE SMOKE EVAC HIGH FLOW (TUBING) ×2 IMPLANT
SHEARS HARMONIC ACE PLUS 45CM (MISCELLANEOUS) ×2 IMPLANT
SLEEVE Z-THREAD 5X100MM (TROCAR) ×6 IMPLANT
SPIKE FLUID TRANSFER (MISCELLANEOUS) ×2 IMPLANT
STRIP CLOSURE SKIN 1/2X4 (GAUZE/BANDAGES/DRESSINGS) IMPLANT
SUT ETHIBOND 2 0 SH (SUTURE) ×3
SUT ETHIBOND 2 0 SH 36X2 (SUTURE) ×3 IMPLANT
SUT MNCRL AB 4-0 PS2 18 (SUTURE) ×2 IMPLANT
SUT SURGIDAC NAB ES-9 0 48 120 (SUTURE) ×8 IMPLANT
TIP INNERVISION DETACH 40FR (MISCELLANEOUS) IMPLANT
TIP INNERVISION DETACH 50FR (MISCELLANEOUS) IMPLANT
TIP INNERVISION DETACH 56FR (MISCELLANEOUS) IMPLANT
TIPS INNERVISION DETACH 40FR (MISCELLANEOUS)
TOWEL OR 17X26 10 PK STRL BLUE (TOWEL DISPOSABLE) ×2 IMPLANT
TOWEL OR NON WOVEN STRL DISP B (DISPOSABLE) IMPLANT
TRAY FOLEY MTR SLVR 14FR STAT (SET/KITS/TRAYS/PACK) ×1 IMPLANT
TRAY FOLEY MTR SLVR 16FR STAT (SET/KITS/TRAYS/PACK) ×1 IMPLANT
TRAY LAPAROSCOPIC (CUSTOM PROCEDURE TRAY) ×2 IMPLANT
TROCAR BALLN 12MMX100 BLUNT (TROCAR) IMPLANT
TROCAR XCEL NON-BLD 11X100MML (ENDOMECHANICALS) ×2 IMPLANT
TROCAR Z-THREAD OPTICAL 5X100M (TROCAR) ×2 IMPLANT
TUBE CALIBRATION LAPBAND (TUBING) ×1 IMPLANT

## 2022-02-27 NOTE — Interval H&P Note (Signed)
History and Physical Interval Note:  02/27/2022 10:22 AM  Bartholomew Boards  has presented today for surgery, with the diagnosis of HIATAL HERNIA GERD.  The various methods of treatment have been discussed with the patient and family. After consideration of risks, benefits and other options for treatment, the patient has consented to  Procedure(s): LAPAROSCOPIC REPAIR OF HIATAL HERNIA (N/A) TRANSORAL INCISIONLESS FUNDOPLICATION (N/A) ESOPHAGOGASTRODUODENOSCOPY (EGD) (N/A) with esophageal dilation as a surgical intervention.  The patient's history has been reviewed, patient examined, no change in status, stable for surgery.  I have reviewed the patient's chart and labs.  Questions were answered to the patient's satisfaction.     Verlin Dike Hyman Crossan

## 2022-02-27 NOTE — Plan of Care (Signed)
Patient AO x4, pt walked to bed from stretcher. Complains of gas pain and mild nausea. Will continue to monitor.

## 2022-02-27 NOTE — Anesthesia Postprocedure Evaluation (Signed)
Anesthesia Post Note  Patient: Martha [name]  Procedure(s) Performed: LAPAROSCOPIC REPAIR OF HIATAL HERNIA TRANSORAL INCISIONLESS FUNDOPLICATION ESOPHAGOGASTRODUODENOSCOPY (EGD)     Patient location during evaluation: PACU Anesthesia Type: General Level of consciousness: awake and alert Pain management: pain level controlled Vital Signs Assessment: post-procedure vital signs reviewed and stable Respiratory status: spontaneous breathing, nonlabored ventilation, respiratory function stable and patient connected to nasal cannula oxygen Cardiovascular status: blood pressure returned to baseline and stable Postop Assessment: no apparent nausea or vomiting Anesthetic complications: no   No notable events documented.  Last Vitals:  Vitals:   02/27/22 1445 02/27/22 1519  BP: 140/68 (!) 177/83  Pulse: 80 79  Resp: 12 15  Temp:  36.7 C  SpO2: 95% 97%    Last Pain:  Vitals:   02/27/22 1519  TempSrc: Oral  PainSc:                  [name] S

## 2022-02-27 NOTE — Op Note (Signed)
[facility] Patient Name: Martha [name] Procedure Date: 02/27/2022 MRN: [id number] Attending MD: Gerrit Heck , MD Date of Birth: [birth year] CSN: [id number] Age: 61 Admit Type: Inpatient Procedure:                EGD with endoscopic fundoplication and esophageal                            dilation Indications:              Heartburn, For therapy of reflux esophagitis, For                            therapy of hiatal hernia Providers:                Gerrit Heck, MD, Burtis Junes, RN, William Dalton, Technician, Leighton Ruff. Redmond Pulling, MD (co-surgeon) Referring MD:              Medicines:                General Anesthesia Complications:            No immediate complications. Estimated Blood Loss:     Estimated blood loss was minimal. Procedure:                Pre-Anesthesia Assessment:                           - Prior to the procedure, a History and Physical                            was performed, and patient medications and                            allergies were reviewed. The patient's tolerance of                            previous anesthesia was also reviewed. The risks                            and benefits of the procedure and the sedation                            options and risks were discussed with the patient.                            All questions were answered, and informed consent                            was obtained. Prior Anticoagulants: The patient has                            taken no previous anticoagulant or antiplatelet  agents. ASA Grade Assessment: II - A patient with                            mild systemic disease. After reviewing the risks                            and benefits, the patient was deemed in                            satisfactory condition to undergo the procedure.                           After obtaining informed consent, the endoscope was                             passed under direct vision. Throughout the                            procedure, the patient's blood pressure, pulse, and                            oxygen saturations were monitored continuously. The                            [id number] Olympus endoscope was introduced                            through the mouth, and advanced to the second part                            of duodenum. The upper GI endoscopy was                            accomplished without difficulty. The patient                            tolerated the procedure well. Scope In: Scope Out: Findings:      The upper third of the esophagus, middle third of the esophagus and       lower third of the esophagus were normal. The decision was made to       perform esophageal dilation in anticipation of passage of the larger       caliber Esophyx device. A guidewire was placed and the scope was       withdrawn. Dilation was performed with a Savary dilator with mild       resistance at 17 mm in the proximal esophagus. The dilation site was       examined following endoscope reinsertion and showed no bleeding, mucosal       tear or perforation. Estimated blood loss: none.      LA Grade A (one or more mucosal breaks less than 5 mm, not extending       between tops of 2 mucosal folds) esophagitis with no bleeding was found       37 cm from the incisors. The previously noted hiatal hernia has since  been surgically repaired. The decision was made to perform transoral       fundoplication with the EsophyX Z+ system. Before the procedure, the       gastroesophageal flap valve was classified as Hill Grade II (fold       present, opens with respiration). The endoscope was withdrawn, placed       through the plication device, reinserted into the patient and advanced       past the level of the GE junction at 37 cm from the incisors and into       the stomach. Next, the endoscope was advanced beyond the device and        retroflexed. The first plication site was identified at the 1 o'clock       position. With the device in the proper position, the helical retractor       was deployed and tissue was pulled into the mold before it was closed.       The device was rotated, suction was applied using the invaginator, then       the device was advanced slightly and two H-shaped fasteners were placed.       The device was reloaded and the process repeated in order to deploy a       total of ten fasteners at the first site. The device was then rotated to       the 11 o'clock position after which the helical retractor was used to       grasp additional tissue within the mold before rotation and deployment       of a total of six fasteners at the second site. To complete       reconstruction of the valve, additional fasteners were deployed at the       following sites: four fasteners at 5 o'clock and four fasteners at 7       o'clock positions. In total, 24 fasteners contributed to create a valve       measuring 3 cm in length which involved 300 degrees of the circumference       upon retroflexed view. The EsophyX device and endoscope were then       removed. Relook endoscopy was performed prior to the conclusion of the       case to confirm the above findings. Estimated blood loss was minimal.      Upon endoscope removal, there was a medium sized mucosal rent in the       proximal esophagus, located at the UES, 16 cm from the incisors. The       appearance was consistent with a mucosal based rent from passage of the       Esophyx device, akin to typical post dilatation appearance. The area was       irrigated and no evidence of perforation or persistent bleeding.      The entire examined stomach was normal.      The examined duodenum was normal. Impression:               - Normal upper third of esophagus, middle third of                            esophagus and lower third of esophagus. Dilated.                            -  LA Grade A reflux esophagitis with no bleeding.                           - Normal stomach.                           - Normal examined duodenum.                           - EsophyX transoral fundoplication was performed.                           - Medium sized mucosal based rent located 16 cm                            from the incisors, consistent with dilation of a                            narrowed proximal esophagus at the level of the UES.                           - No specimens collected. Moderate Sedation:      Not Applicable - Patient had care per Anesthesia. Recommendation:           -Admit to surgical ward for overnight observation                            with anticipated discharge tomorrow                           -Zofran 4 mg IV every 6 hours x24 hours, then prn                           -Reglan 10 mg every 6 hours x24 hours, then prn                           -Resume scopolamine patch x3 days (applied preop)                           -Protonix 40 mg p.o. BID x2 weeks, then 40 mg daily                            x2 weeks, then 20 mg daily x1 week then prn                           -Decadron 8 mg every 6 hours times max 5 doses                           -Gas-X (simethicone) 80 mg p.o. prn every 6 hours                            gas pain, abdominal discomfort                           -  Tylenol 3 (APAP 120 mg/codeine 12 mg per 5 mL): 15                            mL's every 4 hours prn pain                           -Colace 100 mg p.o. twice daily if taking pain                            medications                           -Viscous lidocaine Q6 hours in the first 24 hours,                            then prn every 6 hours as needed for odynophagia or                            esophageal pain.                           -Clear liquid diet okay overnight                           -Okay to ambulate with assist around the ward                           -Please do  not hesitate to contact me directly with                            any postoperative questions or concerns Procedure Code(s):        --- Professional ---                           [id number], Laparoscopy, surgical, repair of                            paraesophageal hernia, includes fundoplasty, when                            performed; without implantation of mesh                           43248, Esophagogastroduodenoscopy, flexible,                            transoral; with insertion of guide wire followed by                            passage of dilator(s) through esophagus over guide                            wire Diagnosis Code(s):        --- Professional ---  K21.00, Gastro-esophageal reflux disease with                            esophagitis, without bleeding                           R12, Heartburn                           K44.9, Diaphragmatic hernia without obstruction or                            gangrene CPT copyright 2019 American Medical Association. All rights reserved. The codes documented in this report are preliminary and upon coder review may  be revised to meet current compliance requirements. Gerrit Heck, MD 02/27/2022 1:59:09 PM Gayland Curry MD, MD Number of Addenda: 0

## 2022-02-27 NOTE — Op Note (Signed)
02/27/2022  12:49 PM  PATIENT:  Bartholomew Boards  61 y.o. female  PRE-OPERATIVE DIAGNOSIS:  SLIDING HIATAL HERNIA WITH GERD  POST-OPERATIVE DIAGNOSIS:  same  PROCEDURE:  Procedure(s): LAPAROSCOPIC REPAIR OF HIATAL HERNIA -Dr Andrey Campanile LAPAROSCOPIC BILATERAL TAP BLOCK - Dr Andrey Campanile TRANSORAL INCISIONLESS FUNDOPLICATION - Dr Barron Alvine ESOPHAGOGASTRODUODENOSCOPY (EGD)- Dr Barron Alvine  Co-SURGEON:  Surgeon(s): Gaynelle Adu, MD Shellia Cleverly, DO  ASSISTANTS: Luretha Murphy, MD   ANESTHESIA:   general  DRAINS: none   LOCAL MEDICATIONS USED:  MARCAINE    and OTHER exparel  SPECIMEN:  No Specimen  DISPOSITION OF SPECIMEN:  N/A  COUNTS:  YES  INDICATION FOR PROCEDURE: Patient is a 61 year old female with longstanding GERD who was referred by GI for concomitant TIF procedure with hiatal hernia repair.  She was found to have a sliding hiatal hernia repair during her preoperative evaluation.  I was asked to perform repair of her hiatal hernia.  We had previously discussed risk and benefits of the proposed procedure which are separately documented.  PROCEDURE: Patient was given 5000 units of subcutaneous heparin in the preoperative holding area.  She was taken to OR 1 at [facility] and placed upon on the operating room table.  A Foley catheter was placed.  General endotracheal anesthesia was established prior to the Foley catheter.  Her arms were tucked at her side with the appropriate padding.  Sequential compression devices were placed.  Her abdomen was prepped and draped in the usual standard surgical fashion with ChloraPrep.  Surgical timeout was performed.  She received IV antibiotic prior to skin incision.  Optical entry was gained in the left upper quadrant at Palmer's point.  A small incision was made just below the left subcostal margin.  Then using a 0 degree 5 mm laparoscope through a 5 mm trocar I advanced it through all layers of the abdominal wall and carefully  entered the abdominal cavity.  Pneumoperitoneum was smoothly established up to a patient pressure of 15 mmHg.  The laparoscope was advanced and the abdominal cavity was surveilled.  There was omental adhesions but I was able to advance the camera to the right side of the abdomen where the abdominal wall was free.  A 5 mm trocar was placed in the right lateral abdominal wall and an 11 mm trocar was placed in the right mid abdomen.  The laparoscope was placed on the right side the abdomen and I visualized the left upper quadrant.  The patient had omental adhesions to anterior abdominal wall in the left upper quadrant and midline.  She had had a prior open abdominal hysterectomy and panniculectomy.  The anterior abdominal wall adhesions were taken down with harmonic scalpel.  I then went about placing a 5 mm trocar slightly above into the left her umbilicus and a final 5 mm trocar in the left lateral abdominal wall.  We then performed a bilateral laparoscopic tap blocks using a combination of Exparel with Marcaine for postoperative pain relief.  Patient was placed in reverse Trendelenburg.  A 5 mm trocar was placed in the subxiphoid position and this was removed and exchanged for a Nathanson liver retractor to lift up the left lobe of the liver.  There was excellent visualization of the hiatus.  There is an anterior dimple consistent with a small sliding hiatal hernia.  The gastrohepatic ligament was incised with harmonic scalpel.  We identified the right crus of the diaphragm.  I identified the confluence of the right and left crura.  I gently incised the peritoneum slightly medial to the right crus with a harmonic scalpel.  The esophagus was identified.  I bluntly dissected with the procedure grasper the hiatus opening.  The entire esophagus was now back into the abdominal cavity.  The hiatal hernia was small.  Probably about 1-1/2 to 2 cm.  The left and right crura were reapproximated with a single interrupted 0  Ethibond Endo Stitch secured with a titanium tie knot.  We had anesthesia advance a balloon calibration tube down the patient's oropharynx down into the stomach and inflated the balloon with 10 cc of air and then had the CRNA pulled back on the calibration tube with the balloon inflated.  It did not pass the hiatus.  It stayed within the abdominal cavity.  The balloon was deflated and the calibration tube was removed.  This told me that we had achieved adequate hiatal closure.  Again the entire stomach was in the abdominal cavity with about 2 cm of esophagus.  I visualized the area of omentum that had been taken down to make sure there is no bleeding.  The [facility] liver retractor was removed.  The 11 mm trocar site was closed with the 0 Vicryl using PMI suture passer with laparoscopic guidance.  Local was infiltrated this area.  Remaining trocars were removed after pneumoperitoneum had been released.  Skin incisions were closed with a 4 Monocryl in a subcuticular fashion followed by the application of Dermabond.  All needle, instrument, and sponge counts were correct for this portion of the procedure.   After laparoscopic hiatal hernia repair was complete, Dr. Doristine Locks, a gastroenterologist with endoscopic procedure expertise and trained in endoscopic fundoplication, proceeded to perform endoscopic fundoplication and he will dictate that separately.  PLAN OF CARE: overnight observation  PATIENT DISPOSITION:   still in surgery with PACU   Delay start of Pharmacological VTE agent (>24hrs) due to surgical blood loss or risk of bleeding:  no  Mary Sella. Andrey Campanile, MD, FACS General, Bariatric, & Minimally Invasive Surgery [facility], [place]

## 2022-02-27 NOTE — Consult Note (Addendum)
 GASTROENTEROLOGY PROCEDURE H&P NOTE   Primary Care Physician: McGowen, Philip H, MD    Reason for Procedure:  Hiatal hernia, GERD with erosive esophagitis  Plan: Concomitant laparoscopic hiatal hernia repair and Transoral Incisionless Fundoplication (cTIF)   The nature of the procedure, as well as the risks, benefits, and alternatives were carefully and thoroughly reviewed with the patient. Ample time for discussion and questions allowed. The patient understood, was satisfied, and agreed to proceed.     HPI: Martha [name] is a 61 y.o. female with a history of diabetes, arthralgias, HTN, HLD, spinal stenosis (which limits exercise), who presents for concomitant laparoscopic hiatal hernia repair and Transoral Incisionless Fundoplication (cTIF) for treatment of hiatal hernia and GERD with erosive esophagitis.   GERD history: -Index symptoms: Heartburn, regurgitation, nocturnal cough/choking -Exacerbating features: coffee,eating close to bedtime, tomato based sauces, red wine -Medications trialed: Pantoprazole, famotidine, Carafate, esomeprazole, omeprazole -Current medications: Nexium 40 mg bid (not taking for last few weeks) -Complications: hiatal hernia   GERD evaluation: -Last EGD: 06/2019 -Barium esophagram: None -Esophageal Manometry: 07/2019: 2.4 cm hiatal hernia, Normal motility -pH/Impedance: 07/2019 (on PPI): pH <4 in 8% of time, [place]ter 16.3.  Elevated reflux episodes in supine position.  Negative symptom correlation by SAP -Bravo: N/A   Endoscopic History: - EGD (06/2019): LA Grade A esophagitis, biopsies negative for EOE, small HH   Past Medical History:  Diagnosis Date   Allergic rhinitis    Anemia    Arthralgia of multiple sites    Gen rheum lab w/u normal/neg 11/2016 by prior PCP   Constipation    DDD (degenerative disc disease), lumbar 11/2020   R glut/hip pain->ortho, MRI->disc causing pinched nerve at L3-4 level on R, signif dz on L at L4-5 and  L5-S1--->ESI on R 11/2020 very helpful, +PT.   Diabetes mellitus (HCC) 2022   Prediab 2017 --Old PCP records state only that her Hba1c was 6.1% in 2017: she took metformin x ? 6-12 mo, rx'd by wt loss MD.  A1c 6.1% Nov 2019. A1c 6.2% 12/2019. A1c 6.8% 12/2020.   GERD (gastroesophageal reflux disease)    +grade A esophagitis 06/2019 EGD.  Bx neg for eosinophilic esoph. 08/2019 esoph manometry normal, pH probe + abnl reflux not fully suppressed by PPI. PPI BID + H2 blocker as of 08/2019.   History of rectal bleeding    remote past->? rectal ulcer vs perf'd divertic-->need old records for clarif.   Hypercholesterolemia    Past hx of statin use, then was able to come off meds when she lost wt   Hypertension    Insomnia    Joint pain    Obesity, Class I, BMI 30-34.9    Palpitations    Right hip pain    MRI showed labral tear, got steroid injection. Also troch bursitis.   Right shoulder pain 05/2020   RC tear, bicipital pathology, AC arthrosis and impingement-->to have surgery    Past Surgical History:  Procedure Laterality Date   24 HOUR PH STUDY N/A 08/05/2019   +GERD. Procedure: 24 HOUR PH STUDY;  Surgeon: Beavers, Kimberly, MD;  Location: [facility];  Service: Gastroenterology;  Laterality: N/A;   ABDOMINAL HYSTERECTOMY  2005   for benign dx (No BSO)   BREAST BIOPSY Right 2005   CESAREAN SECTION     x 2   CHOLECYSTECTOMY  2011   COLONOSCOPY  most recent 01/2012   x 2->approx 2011 was done for BRBPR, unclear dx (rectal ulcer? perf'd diverticulum?).  Another about 2015   for abd pain w/u?  No polyps detected on either colonoscopy.  Need old records for clarification (GI MD was Dr. Candiss Nor[place] in [place])--prior pcp records say "colonoscopy 01/2012 negative".   ESOPHAGEAL MANOMETRY N/A 08/05/2019   NORMAL Procedure: ESOPHAGEAL MANOMETRY (EM);  Surgeon: Thornton Park, MD;  Location: [facility];  Service: Gastroenterology;  Laterality: N/A;   ESOPHAGOGASTRODUODENOSCOPY  06/2019     06/2019->reflux  esophagitis, o/w normal.   ROTATOR CUFF REPAIR Right 06/2021   TONSILLECTOMY AND ADENOIDECTOMY  2009    Prior to Admission medications   Medication Sig Start Date End Date Taking? Authorizing Provider  acetaminophen (TYLENOL) 500 MG tablet Take 1,000 mg by mouth every 6 (six) hours as needed for mild pain, fever or headache.   Yes [provider]  atorvastatin (LIPITOR) 20 MG tablet Take 1 tablet (20 mg total) by mouth daily. 11/07/21  Yes McGowen, Adrian Blackwater, MD  cholecalciferol (VITAMIN D) 25 MCG (1000 UT) tablet Take 1,000 Units by mouth daily. 03/18/17  Yes [provider]  gabapentin (NEURONTIN) 300 MG capsule Take 300 mg by mouth every other day. 01/26/22  Yes [provider]  levocetirizine (XYZAL) 5 MG tablet Take 5 mg by mouth daily.    Yes [provider]  lisinopril (ZESTRIL) 10 MG tablet Take 1 tablet (10 mg total) by mouth daily. 07/24/21  Yes McGowen, Adrian Blackwater, MD  meloxicam (MOBIC) 15 MG tablet Take 15 mg by mouth every evening. 02/02/22  Yes [provider]  montelukast (SINGULAIR) 10 MG tablet Take 1 tablet (10 mg total) by mouth at bedtime. 07/24/21  Yes McGowen, Adrian Blackwater, MD  Omega-3 Fatty Acids (OMEGA-3 FISH OIL PO) Take 1 capsule by mouth daily.  03/18/17  Yes [provider]  ondansetron (ZOFRAN) 8 MG tablet Take by mouth every 8 (eight) hours as needed for nausea or vomiting.   Yes [provider]  Probiotic Product (PROBIOTIC PO) Take 1 capsule by mouth daily.   Yes [provider]  Semaglutide, 2 MG/DOSE, 8 MG/3ML SOPN 2mg  SQ q7d Patient taking differently: Inject 2 mg into the skin every Wednesday. 02/06/22  Yes McGowen, Adrian Blackwater, MD  senna (SENOKOT) 8.6 MG TABS tablet Take 1 tablet by mouth at bedtime.   Yes [provider]  esomeprazole (NEXIUM) 40 MG capsule Take 1 capsule (40 mg total) by mouth 2 (two) times daily before a meal. Patient not taking: Reported on 02/19/2022 09/19/21   Levin Erp, PA  famotidine (PEPCID) 40 MG tablet Take 1 tablet (40 mg total) by mouth 2 (two) times daily. Patient not taking: Reported on 02/19/2022 10/20/21   Levin Erp, PA    No current facility-administered medications for this encounter.    Allergies as of 12/12/2021 - Review Complete 11/14/2021  Allergen Reaction Noted   Codeine Anaphylaxis 01/29/2019    Family History  Problem Relation Age of Onset   Diabetes Mother    Alcohol abuse Mother    High blood pressure Mother    Rheumatic fever Father    Heart attack Father    Diabetes Brother    Breast cancer Maternal Aunt    Colon cancer Neg Hx    Esophageal cancer Neg Hx    Rectal cancer Neg Hx    Stomach cancer Neg Hx     Social History   Socioeconomic History   Marital status: Married    Spouse name: Roma Kayser   Number of children: 2   Years of education: Not on  file   Highest education level: Bachelor's degree (e.g., BA, AB, BS)  Occupational History   Occupation: Presenter, broadcasting, Ecologist (travel required)  Tobacco Use   Smoking status: Never   Smokeless tobacco: Never  Vaping Use   Vaping Use: Never used  Substance and Sexual Activity   Alcohol use: Yes    Comment: wine occasional   Drug use: Never   Sexual activity: Not on file  Other Topics Concern   Not on file  Social History Narrative   Married, 2 daughters.   Moved to [facility] from [facility] 2019 to be closer to daughter and grandchildren.   Educ: College in [place].     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   In [place], she works from home for [employer].   Social Determinants of Health   Financial Resource Strain: Low Risk    Difficulty of Paying Living Expenses: Not hard at all  Food Insecurity: No Food Insecurity   Worried About Charity fundraiser in the Last Year: Never true   [place] in the Last Year: Never true  Transportation Needs: No Transportation Needs   Lack of Transportation (Medical):  No   Lack of Transportation (Non-Medical): No  Physical Activity: Insufficiently Active   Days of Exercise per Week: 2 days   Minutes of Exercise per Session: 40 min  Stress: Stress Concern Present   Feeling of Stress : To some extent  Social Connections: Moderately Isolated   Frequency of Communication with Friends and Family: More than three times a week   Frequency of Social Gatherings with Friends and Family: Once a week   Attends Religious Services: Never   Marine scientist or Organizations: No   Attends Music therapist: Not on file   Marital Status: Married  Human resources officer Violence: Not on file    Physical Exam: Vital signs in last 24 hours: @BP  122/73   Pulse 78   Temp 99.1 F (37.3 C) (Oral)   Resp 16   SpO2 100%  GEN: NAD EYE: Sclerae anicteric ENT: MMM CV: Non-tachycardic Pulm: CTA b/l GI: Soft, NT/ND NEURO:  Alert & Oriented x Graf, DO [facility]   02/27/2022 9:10 AM

## 2022-02-27 NOTE — H&P (View-Only) (Signed)
GASTROENTEROLOGY PROCEDURE H&P NOTE   Primary Care Physician: Tammi Sou, MD    Reason for Procedure:  Hiatal hernia, GERD with erosive esophagitis  Plan: Concomitant laparoscopic hiatal hernia repair and Transoral Incisionless Fundoplication (cTIF)   The nature of the procedure, as well as the risks, benefits, and alternatives were carefully and thoroughly reviewed with the patient. Ample time for discussion and questions allowed. The patient understood, was satisfied, and agreed to proceed.     HPI: Martha [name] is a 61 y.o. female with a history of diabetes, arthralgias, HTN, HLD, spinal stenosis (which limits exercise), who presents for concomitant laparoscopic hiatal hernia repair and Transoral Incisionless Fundoplication (cTIF) for treatment of hiatal hernia and GERD with erosive esophagitis.   GERD history: -Index symptoms: Heartburn, regurgitation, nocturnal cough/choking -Exacerbating features: coffee,eating close to bedtime, tomato based sauces, red wine -Medications trialed: Pantoprazole, famotidine, Carafate, esomeprazole, omeprazole -Current medications: Nexium 40 mg bid (not taking for last few weeks) -Complications: hiatal hernia   GERD evaluation: -Last EGD: 06/2019 -Barium esophagram: None -Esophageal Manometry: 07/2019: 2.4 cm hiatal hernia, Normal motility -pH/Impedance: 07/2019 (on PPI): pH <4 in 8% of time, DeMeester 16.3.  Elevated reflux episodes in supine position.  Negative symptom correlation by SAP -Bravo: N/A   Endoscopic History: - EGD (06/2019): LA Grade A esophagitis, biopsies negative for EOE, small HH   Past Medical History:  Diagnosis Date   Allergic rhinitis    Anemia    Arthralgia of multiple sites    Gen rheum lab w/u normal/neg 11/2016 by prior PCP   Constipation    DDD (degenerative disc disease), lumbar 11/2020   R glut/hip pain->ortho, MRI->disc causing pinched nerve at L3-4 level on R, signif dz on L at L4-5 and  L5-S1--->ESI on R 11/2020 very helpful, +PT.   Diabetes mellitus ([place]) 2022   Prediab 2017 --Old PCP records state only that her Hba1c was 6.1% in 2017: she took metformin x ? 6-12 mo, rx'd by wt loss MD.  A1c 6.1% Nov 2019. A1c 6.2% 12/2019. A1c 6.8% 12/2020.   GERD (gastroesophageal reflux disease)    +grade A esophagitis 06/2019 EGD.  Bx neg for eosinophilic esoph. [id number] esoph manometry normal, pH probe + abnl reflux not fully suppressed by PPI. PPI BID + H2 blocker as of 08/2019.   History of rectal bleeding    remote past->? rectal ulcer vs perf'd divertic-->need old records for clarif.   Hypercholesterolemia    Past hx of statin use, then was able to come off meds when she lost wt   Hypertension    Insomnia    Joint pain    Obesity, Class I, BMI 30-34.9    Palpitations    Right hip pain    MRI showed labral tear, got steroid injection. Also troch bursitis.   Right shoulder pain 05/2020   RC tear, bicipital pathology, AC arthrosis and impingement-->to have surgery    Past Surgical History:  Procedure Laterality Date   53 HOUR PH STUDY N/A 08/05/2019   +GERD. Procedure: [place] STUDY;  Surgeon: Thornton Park, MD;  Location: [facility];  Service: Gastroenterology;  Laterality: N/A;   ABDOMINAL HYSTERECTOMY  2005   for benign dx (No BSO)   BREAST BIOPSY Right 2005   CESAREAN SECTION     x 2   CHOLECYSTECTOMY  2011   COLONOSCOPY  most recent 01/2012   x 2->approx 2011 was done for BRBPR, unclear dx (rectal ulcer? perf'd diverticulum?).  Another about 2015  for abd pain w/u?  No polyps detected on either colonoscopy.  Need old records for clarification (GI MD was Dr. Candiss [place] in [place])--prior pcp records say "colonoscopy 01/2012 negative".   ESOPHAGEAL MANOMETRY N/A 08/05/2019   NORMAL Procedure: ESOPHAGEAL MANOMETRY (EM);  Surgeon: Thornton Park, MD;  Location: [facility];  Service: Gastroenterology;  Laterality: N/A;   ESOPHAGOGASTRODUODENOSCOPY  06/2019     06/2019->reflux  esophagitis, o/w normal.   ROTATOR CUFF REPAIR Right 06/2021   TONSILLECTOMY AND ADENOIDECTOMY  2009    Prior to Admission medications   Medication Sig Start Date End Date Taking? Authorizing Provider  acetaminophen (TYLENOL) 500 MG tablet Take 1,000 mg by mouth every 6 (six) hours as needed for mild pain, fever or headache.   Yes [provider]  atorvastatin (LIPITOR) 20 MG tablet Take 1 tablet (20 mg total) by mouth daily. 11/07/21  Yes McGowen, Adrian Blackwater, MD  cholecalciferol (VITAMIN D) 25 MCG (1000 UT) tablet Take 1,000 Units by mouth daily. 03/18/17  Yes [provider]  gabapentin (NEURONTIN) 300 MG capsule Take 300 mg by mouth every other day. 01/26/22  Yes [provider]  levocetirizine (XYZAL) 5 MG tablet Take 5 mg by mouth daily.    Yes [provider]  lisinopril (ZESTRIL) 10 MG tablet Take 1 tablet (10 mg total) by mouth daily. 07/24/21  Yes McGowen, Adrian Blackwater, MD  meloxicam (MOBIC) 15 MG tablet Take 15 mg by mouth every evening. 02/02/22  Yes [provider]  montelukast (SINGULAIR) 10 MG tablet Take 1 tablet (10 mg total) by mouth at bedtime. 07/24/21  Yes McGowen, Adrian Blackwater, MD  Omega-3 Fatty Acids (OMEGA-3 FISH OIL PO) Take 1 capsule by mouth daily.  03/18/17  Yes [provider]  ondansetron (ZOFRAN) 8 MG tablet Take by mouth every 8 (eight) hours as needed for nausea or vomiting.   Yes [provider]  Probiotic Product (PROBIOTIC PO) Take 1 capsule by mouth daily.   Yes [provider]  Semaglutide, 2 MG/DOSE, 8 MG/3ML SOPN 2mg  SQ q7d Patient taking differently: Inject 2 mg into the skin every Wednesday. 02/06/22  Yes McGowen, Adrian Blackwater, MD  senna (SENOKOT) 8.6 MG TABS tablet Take 1 tablet by mouth at bedtime.   Yes [provider]  esomeprazole (NEXIUM) 40 MG capsule Take 1 capsule (40 mg total) by mouth 2 (two) times daily before a meal. Patient not taking: Reported on 02/19/2022 09/19/21   Levin Erp, PA  famotidine (PEPCID) 40 MG tablet Take 1 tablet (40 mg total) by mouth 2 (two) times daily. Patient not taking: Reported on 02/19/2022 10/20/21   Levin Erp, PA    No current facility-administered medications for this encounter.    Allergies as of 12/12/2021 - Review Complete 11/14/2021  Allergen Reaction Noted   Codeine Anaphylaxis 01/29/2019    Family History  Problem Relation Age of Onset   Diabetes Mother    Alcohol abuse Mother    High blood pressure Mother    Rheumatic fever Father    Heart attack Father    Diabetes Brother    Breast cancer Maternal Aunt    Colon cancer Neg Hx    Esophageal cancer Neg Hx    Rectal cancer Neg Hx    Stomach cancer Neg Hx     Social History   Socioeconomic History   Marital status: Married    Spouse name: Roma Kayser   Number of children: 2   Years of education: Not on  file   Highest education level: Bachelor's degree (e.g., BA, AB, BS)  Occupational History   Occupation: Presenter, broadcasting, Ecologist (travel required)  Tobacco Use   Smoking status: Never   Smokeless tobacco: Never  Vaping Use   Vaping Use: Never used  Substance and Sexual Activity   Alcohol use: Yes    Comment: wine occasional   Drug use: Never   Sexual activity: Not on file  Other Topics Concern   Not on file  Social History Narrative   Married, 2 daughters.   Moved to [facility] from [facility] 2019 to be closer to daughter and grandchildren.   Educ: College in [place].     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   In , she works from home for [employer].   Social Determinants of Health   Financial Resource Strain: Low Risk    Difficulty of Paying Living Expenses: Not hard at all  Food Insecurity: No Food Insecurity   Worried About Charity fundraiser in the Last Year: Never true   [place] in the Last Year: Never true  Transportation Needs: No Transportation Needs   Lack of Transportation (Medical):  No   Lack of Transportation (Non-Medical): No  Physical Activity: Insufficiently Active   Days of Exercise per Week: 2 days   Minutes of Exercise per Session: 40 min  Stress: Stress Concern Present   Feeling of Stress : To some extent  Social Connections: Moderately Isolated   Frequency of Communication with Friends and Family: More than three times a week   Frequency of Social Gatherings with Friends and Family: Once a week   Attends Religious Services: Never   Marine scientist or Organizations: No   Attends Music therapist: Not on file   Marital Status: Married  Human resources officer Violence: Not on file    Physical Exam: Vital signs in last 24 hours: @BP  122/73   Pulse 78   Temp 99.1 F (37.3 C) (Oral)   Resp 16   SpO2 100%  GEN: NAD EYE: Sclerae anicteric ENT: MMM CV: Non-tachycardic Pulm: CTA b/l GI: Soft, NT/ND NEURO:  Alert & Oriented x [place], DO [facility]   02/27/2022 9:10 AM

## 2022-02-27 NOTE — Transfer of Care (Signed)
Immediate Anesthesia Transfer of Care Note  Patient: Martha [name]  Procedure(s) Performed: LAPAROSCOPIC REPAIR OF HIATAL HERNIA TRANSORAL INCISIONLESS FUNDOPLICATION ESOPHAGOGASTRODUODENOSCOPY (EGD)  Patient Location: PACU  Anesthesia Type:General  Level of Consciousness: sedated, patient cooperative and responds to stimulation  Airway & Oxygen Therapy: Patient Spontanous Breathing and Patient connected to face mask oxygen  Post-op Assessment: Report given to RN and Post -op Vital signs reviewed and stable  Post vital signs: Reviewed and stable  Last Vitals:  Vitals Value Taken Time  BP 162/89 02/27/22 1333  Temp    Pulse 94 02/27/22 1336  Resp 15 02/27/22 1337  SpO2 100 % 02/27/22 1336  Vitals shown include unvalidated device data.  Last Pain:  Vitals:   02/27/22 0924  TempSrc:   PainSc: 0-No pain         Complications: No notable events documented.

## 2022-02-27 NOTE — H&P (Signed)
CC: here for combined TIF and sliding hiatal hernia repair  Requesting provider: n/a  HPI: Martha [name] is an 61 y.o. female who is here for laparoscopic repair of sliding hiatal hernia with concomitant TIF by GI  Denies any changes since seen in clinic in February. She has lost some weight since initially seen - planned - was placed on ozempic. Minimal se with ozempic   Old hpi:  Martha [name] is a 61 y.o. female who is seen today as an office consultation at the request of Dr. Barron Alvine for evaluation of hiatal hernia along with TIF by GI.   She states that she has had reflux for years. She states that she has "strong reflux ". She has had symptoms despite being on 2 different medications. She takes famotidine 40 mg twice a day as well as omeprazole 40 mg twice a day. She states she still has heartburn despite taking these medications and really cannot tell a difference whether or not she takes the medications or not. Her symptoms are more so at night. She describes a burning in her throat. Rare dysphagia. She may occasionally have an issue with steak if she eats too big of a bite. No burping or belching. No regurgitation. Tries to sleep elevated but she states that since she is so short she slips down in the bed.  She has had laparoscopic cholecystectomy and an abdominoplasty.  She denies any chest pain, chest pressure, shortness of breath, dyspnea on exertion, orthopnea, paroxysmal nocturnal dyspnea.  Her comorbidities include diabetes, arthralgias, hypertension, hyperlipidemia and spinal stenosis  GERD history: -Index symptoms: Heartburn, regurgitation, nocturnal cough/choking -Exacerbating features: coffee,eating close to bedtime, tomato based sauces, red wine -Medications trialed: Pantoprazole, famotidine, Carafate, esomeprazole, omeprazole -Current medications: Nexium 40 mg bid -Complications: hiatal hernia  Past Medical History:  Diagnosis Date   Allergic  rhinitis    Anemia    Arthralgia of multiple sites    Gen rheum lab w/u normal/neg 11/2016 by prior PCP   Constipation    DDD (degenerative disc disease), lumbar 11/2020   R glut/hip pain->ortho, MRI->disc causing pinched nerve at L3-4 level on R, signif dz on L at L4-5 and L5-S1--->ESI on R 11/2020 very helpful, +PT.   Diabetes mellitus ([facility]) 2022   Prediab 2017 --Old PCP records state only that her Hba1c was 6.1% in 2017: she took metformin x ? 6-12 mo, rx'd by wt loss MD.  A1c 6.1% Nov 2019. A1c 6.2% 12/2019. A1c 6.8% 12/2020.   GERD (gastroesophageal reflux disease)    +grade A esophagitis 06/2019 EGD.  Bx neg for eosinophilic esoph. 08/2019 esoph manometry normal, pH probe + abnl reflux not fully suppressed by PPI. PPI BID + H2 blocker as of 08/2019.   History of rectal bleeding    remote past->? rectal ulcer vs perf'd divertic-->need old records for clarif.   Hypercholesterolemia    Past hx of statin use, then was able to come off meds when she lost wt   Hypertension    Insomnia    Joint pain    Obesity, Class I, BMI 30-34.9    Palpitations    Right hip pain    MRI showed labral tear, got steroid injection. Also troch bursitis.   Right shoulder pain 05/2020   RC tear, bicipital pathology, AC arthrosis and impingement-->to have surgery    Past Surgical History:  Procedure Laterality Date   10 HOUR PH STUDY N/A 08/05/2019   +GERD. Procedure: 24 HOUR PH STUDY;  Surgeon:  Thornton Park, MD;  Location: [facility];  Service: Gastroenterology;  Laterality: N/A;   ABDOMINAL HYSTERECTOMY  2005   for benign dx (No BSO)   BREAST BIOPSY Right 2005   CESAREAN SECTION     x 2   CHOLECYSTECTOMY  2011   COLONOSCOPY  most recent 01/2012   x 2->approx 2011 was done for BRBPR, unclear dx (rectal ulcer? perf'd diverticulum?).  Another about 2015 for abd pain w/u?  No polyps detected on either colonoscopy.  Need old records for clarification (GI MD was Dr. Candiss [place] in [place])--prior pcp records say  "colonoscopy 01/2012 negative".   ESOPHAGEAL MANOMETRY N/A 08/05/2019   NORMAL Procedure: ESOPHAGEAL MANOMETRY (EM);  Surgeon: Thornton Park, MD;  Location: [facility];  Service: Gastroenterology;  Laterality: N/A;   ESOPHAGOGASTRODUODENOSCOPY  06/2019     06/2019->reflux esophagitis, o/w normal.   ROTATOR CUFF REPAIR Right 06/2021   TONSILLECTOMY AND ADENOIDECTOMY  2009    Family History  Problem Relation Age of Onset   Diabetes Mother    Alcohol abuse Mother    High blood pressure Mother    Rheumatic fever Father    Heart attack Father    Diabetes Brother    Breast cancer Maternal Aunt    Colon cancer Neg Hx    Esophageal cancer Neg Hx    Rectal cancer Neg Hx    Stomach cancer Neg Hx     Social:  reports that she has never smoked. She has never used smokeless tobacco. She reports current alcohol use. She reports that she does not use drugs.  Allergies:  Allergies  Allergen Reactions   Codeine Anaphylaxis    Medications: reviwed   ROS - all of the below systems have been reviewed with the patient and positives are indicated with bold text General: chills, fever or night sweats Eyes: blurry vision or double vision ENT: epistaxis or sore throat Allergy/Immunology: itchy/watery eyes or nasal congestion Hematologic/Lymphatic: bleeding problems, blood clots or swollen lymph nodes Endocrine: temperature intolerance or unexpected weight changes Breast: new or changing breast lumps or nipple discharge Resp: cough, shortness of breath, or wheezing CV: chest pain or dyspnea on exertion GI: as per HPI GU: dysuria, trouble voiding, or hematuria MSK: joint pain or joint stiffness Neuro: TIA or stroke symptoms Derm: pruritus and skin lesion changes Psych: anxiety and depression  PE There were no vitals taken for this visit. Constitutional: NAD; conversant; no deformities Eyes: Moist conjunctiva; no lid lag; anicteric; PERRL Neck: Trachea midline; no thyromegaly Lungs:  Normal respiratory effort; no tactile fremitus CV: RRR; no palpable thrills; no pitting edema GI: Abd soft, nt, old incisions; no palpable hepatosplenomegaly MSK: Normal gait; no clubbing/cyanosis Psychiatric: Appropriate affect; alert and oriented x3 Lymphatic: No palpable cervical or axillary lymphadenopathy Skin:no rash/lesions  No results found for this or any previous visit (from the past 69 hour(s)).  No results found.  Imaging: GERD evaluation: -Last EGD: 06/2019 -Barium esophagram: None -Esophageal Manometry: 07/2019: 2.4 cm hiatal hernia, Normal motility -pH/Impedance: 07/2019 (on PPI): pH <4 in 8% of time, De[place]eester 16.3. Elevated reflux episodes in supine position. Negative symptom correlation by SAP -Bravo: N/A   Endoscopic History: - EGD (06/2019): LA Grade A esophagitis, biopsies negative for EOE, small HH  A/P: Martha [name] is an 61 y.o. female with GERD/sliding hiatal hernia Hiatal hernia with GERD and esophagitis  Diabetes mellitus type 2 in obese (CMS-HCC)  Hypertension, essential  Obesity (BMI 30-39.9), unspecified  TO OR for lap hiatal hernia with concomitant  TIF  IV abx ERAS  All questions asked and answered Discussed postop expectations  Leighton Ruff. Redmond Pulling, MD, FACS General, Bariatric, & Minimally Invasive Surgery [facility], [place]

## 2022-02-27 NOTE — Anesthesia Procedure Notes (Addendum)
Procedure Name: Intubation Date/Time: 02/27/2022 11:03 AM Performed by: Gean Maidens, CRNA Pre-anesthesia Checklist: Patient identified, Emergency Drugs available, Suction available and Patient being monitored Patient Re-evaluated:Patient Re-evaluated prior to induction Oxygen Delivery Method: Circle system utilized Preoxygenation: Pre-oxygenation with 100% oxygen Induction Type: IV induction Ventilation: Mask ventilation without difficulty Laryngoscope Size: Mac and 3 Grade View: Grade III Tube type: Oral Tube size: 7.0 mm Number of attempts: 2 Airway Equipment and Method: Oral airway and Bougie stylet Placement Confirmation: ETT inserted through vocal cords under direct vision, positive ETCO2 and breath sounds checked- equal and bilateral Secured at: 21 cm Tube secured with: Tape Dental Injury: Teeth and Oropharynx as per pre-operative assessment

## 2022-02-27 NOTE — Progress Notes (Signed)
Patient reported that she takes Gabapentin to help her sleep and with her anxiety, she also added that she takes Tramadol after if Gabapentin doesn't work. Patient has allergy to Codeine (anaphylactic reaction) in the past. On call provider was paged for Tramadol.Consulted pharmacy for her Gabapentin.We will continue to monitor.

## 2022-02-27 NOTE — Anesthesia Preprocedure Evaluation (Signed)
Anesthesia Evaluation  Patient identified by MRN, date of birth, ID band Patient awake    Reviewed: Allergy & Precautions, NPO status , Patient's Chart, lab work & pertinent test results  Airway Mallampati: II  TM Distance: >3 FB Neck ROM: Full    Dental no notable dental hx.    Pulmonary neg pulmonary ROS,    Pulmonary exam normal breath sounds clear to auscultation       Cardiovascular hypertension, Pt. on medications Normal cardiovascular exam Rhythm:Regular Rate:Normal     Neuro/Psych negative neurological ROS  negative psych ROS   GI/Hepatic Neg liver ROS, hiatal hernia, GERD  Medicated,  Endo/Other    Renal/GU negative Renal ROS  negative genitourinary   Musculoskeletal negative musculoskeletal ROS (+)   Abdominal   Peds negative pediatric ROS (+)  Hematology negative hematology ROS (+)   Anesthesia Other Findings   Reproductive/Obstetrics negative OB ROS                             Anesthesia Physical Anesthesia Plan  ASA: 2  Anesthesia Plan: General   Post-op Pain Management: Lidocaine infusion*   Induction: Intravenous  PONV Risk Score and Plan: 3 and Ondansetron, Dexamethasone, Droperidol and Treatment may vary due to age or medical condition  Airway Management Planned: Oral ETT  Additional Equipment:   Intra-op Plan:   Post-operative Plan: Extubation in OR  Informed Consent: I have reviewed the patients History and Physical, chart, labs and discussed the procedure including the risks, benefits and alternatives for the proposed anesthesia with the patient or authorized representative who has indicated his/her understanding and acceptance.     Dental advisory given  Plan Discussed with: CRNA and Surgeon  Anesthesia Plan Comments:         Anesthesia Quick Evaluation

## 2022-02-28 ENCOUNTER — Encounter (HOSPITAL_COMMUNITY): Payer: Self-pay | Admitting: General Surgery

## 2022-02-28 DIAGNOSIS — K449 Diaphragmatic hernia without obstruction or gangrene: Secondary | ICD-10-CM | POA: Diagnosis not present

## 2022-02-28 MED ORDER — SIMETHICONE 80 MG PO CHEW: 80.0000 mg | CHEWABLE_TABLET | Freq: Four times a day (QID) | ORAL | 0 refills | Status: DC | PRN

## 2022-02-28 MED ORDER — LIDOCAINE VISCOUS HCL 2 % MT SOLN: 5.0000 mL | OROMUCOSAL | 1 refills | Status: DC | PRN

## 2022-02-28 MED ORDER — GABAPENTIN 250 MG/5ML PO SOLN: 300.0000 mg | ORAL | 1 refills | Status: DC

## 2022-02-28 MED ORDER — ONDANSETRON HCL 4 MG PO TABS: 4.0000 mg | ORAL_TABLET | Freq: Four times a day (QID) | ORAL | 1 refills | Status: DC | PRN

## 2022-02-28 MED ORDER — ACETAMINOPHEN 160 MG/5ML PO SOLN: 1000.0000 mg | Freq: Four times a day (QID) | ORAL | 0 refills | Status: DC | PRN

## 2022-02-28 MED ORDER — PANTOPRAZOLE SODIUM 40 MG PO TBEC: 40.0000 mg | DELAYED_RELEASE_TABLET | Freq: Two times a day (BID) | ORAL | 1 refills | Status: DC

## 2022-02-28 MED ORDER — TRAMADOL HCL 50 MG PO TABS: 50.0000 mg | ORAL_TABLET | Freq: Four times a day (QID) | ORAL | 0 refills | Status: AC | PRN

## 2022-02-28 MED ORDER — METOCLOPRAMIDE HCL 10 MG PO TABS: 10.0000 mg | ORAL_TABLET | Freq: Three times a day (TID) | ORAL | 1 refills | Status: DC | PRN

## 2022-02-28 NOTE — Plan of Care (Signed)
Instructions were reviewed with patient. All questions were answered. Patient was transported to main entrance by wheelchair. ° °

## 2022-02-28 NOTE — Discharge Summary (Addendum)
[facility] DISCHARGE SUMMARY  Date of admission: 02/27/2022 Date of discharge: 02/28/2022 Attending: Dr. Bryan Lemma Primary Care provider: Shawnie Dapper, MD Discharge diagnosis: GERD, hiatal hernia Consultations: None Procedures performed: Laparoscopic hiatal hernia repair and endoscopic fundoplication (cTIF) History: See admission H&P Exam: See below  Hospital course: 1) GERD with erosive esophagitis 2) Hiatal hernia Martha [name] is a 61 y.o. female s/p laparoscopic hiatal hernia repair and EGD with Transoral Incisionless Fundoplication (TIF) completed on [id number] without complications. Did well overnight, without any acute events.  Tolerating liquid diet and oral medications without issue.  Discharge vital signs: See below Discharge labs: None Disposition: Home in stable condition Follow-up appointments: See below  Day of Discharge:  Did well overnight, without any acute events.  Tolerating liquid diet and oral medications without issue.  Objective: Vital signs in last 24 hours: Temp:  [97.5 F (36.4 C)-99.1 F (37.3 C)] 98.5 F (36.9 C) (05/24 0603) Pulse Rate:  [71-106] 99 (05/24 0603) Resp:  [11-19] 18 (05/24 0603) BP: (122-177)/(68-93) 133/68 (05/24 0603) SpO2:  [94 %-100 %] 94 % (05/24 0603) Last BM Date : 02/26/22 General: NAD Lungs:  CTA b/l, no w/r/r Heart:  RRR, no m/r/g Abdomen:  Mild expected post operative TTP in epigastrium without rebound or guarding, no peritoneal signs. Otherwise, soft, ND, +BS Ext:  No c/c/e    Intake/Output from previous day: 05/23 0701 - 05/24 0700 In: 1764.1 [P.O.:360; I.V.:1154.1; IV Piggyback:250] Out: 1925 [Urine:1900; Blood:25] Intake/Output this shift: No intake/output data recorded.   Lab Results: No results for input(s): WBC, HGB, PLT, MCV in the last 72 hours. BMET No results for input(s): NA, K, CL, CO2, GLUCOSE, BUN, CREATININE, CALCIUM in the last 72 hours. LFT No results for input(s):  PROT, ALBUMIN, AST, ALT, ALKPHOS, BILITOT, BILIDIR, IBILI in the last 72 hours. PT/INR No results for input(s): INR in the last 72 hours.    Imaging/Other results: No results found.    Assessment and Plan:  Martha [name] is a 61 y.o. female s/p EGD with Transoral Incisionless Fundoplication (TIF) completed yesterday with no events on overnight observation. Will plan on d/c to home today with the following plan:   - Protonix 40 mg PO BID for 2 weeks, then 40 mg daily for 2 weeks, then 20 mg daily for 1 week, then prn  - Discharge with Zofran 4 mg PO prn Q6 hours for nausea  - Discharge with Reglan 10 mg PO prn Q6 hours for nausea  - Discharge with Simethicone 80 mg PO prn Q6 hours for bloating/abdominal discomfort - OTC Tylenol as needed for mild postoperative pain - Discharge with viscous lidocaine 5 mL every 8 hours as needed for esophageal pain - Codeine allergy (anaphylaxis). Tolerated Tramadol o/n w/o issue. Will discharge with Tramadol, 5-day supply - Does take Neurontin as outpatient for back pain.  She is concerned about tolerating the Neurontin tablet.  Discharged with Neurontin solution 250 mg / 5 mL to take for the next couple days until tolerating her Neurontin tablet again, and can follow-up with her spine surgeon if requiring more  Diet:  - 2 weeks of liquid soft foods followed by 4 weeks slowly progressive diet back to regular  - Provided with handout for post operative diet plan   Post Op Activity:  - Week 1: encourage short distance walking, minimal physical activity, no lifting >5 lbs  - Week 2: Slow climbing stairs, no intense exercise, no lifting >5 lbs  - Week 3-6: No intense exercise,  may lift up to 25 lbs  - Week 7: Resume normal activity   - To follow-up with me on 03/19/2022 at 9:00 AM in the [place], DO  02/28/2022, 7:58 AM [facility] Pager [phone]

## 2022-02-28 NOTE — Progress Notes (Signed)
Delayed note entry - pt seen at 0718  Chief Complaint/Subjective: Doing ok. Husband at bs Some nausea. Some stickiness. Got down 2.5 cups of water and thicker liquid No vomiting. Sore throat  Objective: Vital signs in last 24 hours: Temp:  [97.5 F (36.4 C)-98.8 F (37.1 C)] 98.1 F (36.7 C) (05/24 0845) Pulse Rate:  [71-106] 76 (05/24 0845) Resp:  [11-18] 16 (05/24 0845) BP: (133-177)/(68-93) 143/72 (05/24 0845) SpO2:  [94 %-100 %] 96 % (05/24 0845) Last BM Date : 02/26/22  Intake/Output from previous day: 05/23 0701 - 05/24 0700 In: 1764.1 [P.O.:360; I.V.:1154.1; IV Piggyback:250] Out: 1925 [Urine:1900; Blood:25] Intake/Output this shift: Total I/O In: 240 [P.O.:240] Out: 400 [Urine:400]  Lungs: cta, nonlabored  Cardiovascular: reg  Abd: soft, mild approp ttp, incisions ok.   Extremities: no edema, +SCDs  Neuro: alert, nonfocal  Lab Results: CBC  No results for input(s): WBC, HGB, HCT, PLT in the last 72 hours. BMET No results for input(s): NA, K, CL, CO2, GLUCOSE, BUN, CREATININE, CALCIUM in the last 72 hours. LFT    Latest Ref Rng & Units 11/14/2021    8:39 AM 07/24/2021    8:53 AM 01/03/2021    9:29 AM  Hepatic Function  Total Protein 6.0 - 8.3 g/dL 7.4   6.8   7.4    Albumin 3.5 - 5.2 g/dL 4.7   4.4   4.5    AST 0 - 37 U/L 20   18   32    ALT 0 - 35 U/L 23   22   36    Alk Phosphatase 39 - 117 U/L 109   86   99    Total Bilirubin 0.2 - 1.2 mg/dL 0.4   0.3   0.4     PT/INR No results for input(s): LABPROT, INR in the last 72 hours. ABG No results for input(s): PHART, HCO3 in the last 72 hours.  Invalid input(s): PCO2, PO2  Studies/Results:  Anti-infectives: Anti-infectives (From admission, onward)    Start     Dose/Rate Route Frequency Ordered Stop   02/27/22 0915  ceFAZolin (ANCEF) IVPB 2g/100 mL premix        2 g 200 mL/hr over 30 Minutes Intravenous  Once 02/27/22 0914 02/27/22 1124       Medications: Scheduled Meds:  dexamethasone  (DECADRON) injection  8 mg Intravenous Q6H   gabapentin  300 mg Oral Q48H   heparin injection (subcutaneous)  5,000 Units Subcutaneous Q8H   loratadine  10 mg Oral Daily   metoCLOPramide (REGLAN) injection  10 mg Intravenous Q6H   ondansetron (ZOFRAN) IV  4 mg Intravenous Q6H   pantoprazole  40 mg Oral BID   senna  1 tablet Oral QHS   Continuous Infusions:  sodium chloride Stopped (02/28/22 0905)   PRN Meds:.acetaminophen (TYLENOL) oral liquid 160 mg/5 mL, lidocaine, simethicone, traMADol  Assessment/Plan: Patient Active Problem List   Diagnosis Date Noted   Hiatal hernia with GERD and esophagitis 02/27/2022   Prediabetes 01/05/2020   Vitamin D insufficiency 01/05/2020   Class 1 obesity due to excess calories without serious comorbidity with body mass index (BMI) of 33.0 to 33.9 in adult 01/04/2020   HTN (hypertension) 01/04/2020   Gastroesophageal reflux disease    Hiatal hernia    s/p Procedure(s): LAPAROSCOPIC REPAIR OF HIATAL HERNIA TRANSORAL INCISIONLESS FUNDOPLICATION ESOPHAGOGASTRODUODENOSCOPY (EGD) 02/27/2022  Doing well Expected discomfort - vitals ok. No clinical sign of leak Rediscussed proper eating techniques and behaviors over next few weeks Ok  for dc later today Discussed wound care and dc instructions  Disposition:  LOS: 0 days    Leighton Ruff. Redmond Pulling, MD, FACS General, Bariatric, & Minimally Invasive Surgery [phone] [facility]

## 2022-03-15 ENCOUNTER — Ambulatory Visit: Payer: 59 | Admitting: Gastroenterology

## 2022-03-16 ENCOUNTER — Other Ambulatory Visit: Payer: Self-pay

## 2022-03-16 ENCOUNTER — Telehealth: Payer: Self-pay | Admitting: Gastroenterology

## 2022-03-16 ENCOUNTER — Emergency Department (HOSPITAL_COMMUNITY): Payer: 59

## 2022-03-16 ENCOUNTER — Observation Stay (HOSPITAL_COMMUNITY)
Admission: EM | Admit: 2022-03-16 | Discharge: 2022-03-18 | Disposition: A | Discharge status: Home / Self Care | Payer: 59 | Attending: Internal Medicine | Admitting: Internal Medicine

## 2022-03-16 ENCOUNTER — Encounter (HOSPITAL_COMMUNITY): Payer: Self-pay

## 2022-03-16 DIAGNOSIS — K449 Diaphragmatic hernia without obstruction or gangrene: Secondary | ICD-10-CM | POA: Insufficient documentation

## 2022-03-16 DIAGNOSIS — Z833 Family history of diabetes mellitus: Secondary | ICD-10-CM

## 2022-03-16 DIAGNOSIS — G4700 Insomnia, unspecified: Secondary | ICD-10-CM | POA: Diagnosis present

## 2022-03-16 DIAGNOSIS — N390 Urinary tract infection, site not specified: Secondary | ICD-10-CM

## 2022-03-16 DIAGNOSIS — N179 Acute kidney failure, unspecified: Secondary | ICD-10-CM | POA: Diagnosis present

## 2022-03-16 DIAGNOSIS — B9620 Unspecified Escherichia coli [E. coli] as the cause of diseases classified elsewhere: Secondary | ICD-10-CM | POA: Diagnosis present

## 2022-03-16 DIAGNOSIS — Z79899 Other long term (current) drug therapy: Secondary | ICD-10-CM | POA: Diagnosis not present

## 2022-03-16 DIAGNOSIS — Z9049 Acquired absence of other specified parts of digestive tract: Secondary | ICD-10-CM

## 2022-03-16 DIAGNOSIS — R109 Unspecified abdominal pain: Secondary | ICD-10-CM | POA: Diagnosis present

## 2022-03-16 DIAGNOSIS — K5900 Constipation, unspecified: Secondary | ICD-10-CM | POA: Diagnosis present

## 2022-03-16 DIAGNOSIS — N200 Calculus of kidney: Secondary | ICD-10-CM | POA: Diagnosis not present

## 2022-03-16 DIAGNOSIS — Z6830 Body mass index (BMI) 30.0-30.9, adult: Secondary | ICD-10-CM

## 2022-03-16 DIAGNOSIS — E860 Dehydration: Secondary | ICD-10-CM | POA: Diagnosis present

## 2022-03-16 DIAGNOSIS — R112 Nausea with vomiting, unspecified: Secondary | ICD-10-CM

## 2022-03-16 DIAGNOSIS — Z885 Allergy status to narcotic agent status: Secondary | ICD-10-CM

## 2022-03-16 DIAGNOSIS — E1141 Type 2 diabetes mellitus with diabetic mononeuropathy: Secondary | ICD-10-CM | POA: Diagnosis present

## 2022-03-16 DIAGNOSIS — A419 Sepsis, unspecified organism: Secondary | ICD-10-CM | POA: Diagnosis not present

## 2022-03-16 DIAGNOSIS — R1013 Epigastric pain: Secondary | ICD-10-CM

## 2022-03-16 DIAGNOSIS — N12 Tubulo-interstitial nephritis, not specified as acute or chronic: Secondary | ICD-10-CM | POA: Diagnosis present

## 2022-03-16 DIAGNOSIS — K219 Gastro-esophageal reflux disease without esophagitis: Secondary | ICD-10-CM | POA: Diagnosis not present

## 2022-03-16 DIAGNOSIS — E7800 Pure hypercholesterolemia, unspecified: Secondary | ICD-10-CM | POA: Diagnosis present

## 2022-03-16 DIAGNOSIS — J309 Allergic rhinitis, unspecified: Secondary | ICD-10-CM | POA: Diagnosis present

## 2022-03-16 DIAGNOSIS — E669 Obesity, unspecified: Secondary | ICD-10-CM | POA: Diagnosis present

## 2022-03-16 DIAGNOSIS — I10 Essential (primary) hypertension: Secondary | ICD-10-CM | POA: Diagnosis not present

## 2022-03-16 LAB — PROTIME-INR
INR: 1.2 (ref 0.8–1.2)
Prothrombin Time: 14.6 seconds (ref 11.4–15.2)

## 2022-03-16 LAB — COMPREHENSIVE METABOLIC PANEL
ALT: 20 U/L (ref 0–44)
AST: 17 U/L (ref 15–41)
Albumin: 4 g/dL (ref 3.5–5.0)
Alkaline Phosphatase: 79 U/L (ref 38–126)
Anion gap: 10 (ref 5–15)
BUN: 14 mg/dL (ref 8–23)
CO2: 22 mmol/L (ref 22–32)
Calcium: 9.3 mg/dL (ref 8.9–10.3)
Chloride: 107 mmol/L (ref 98–111)
Creatinine, Ser: 1.21 mg/dL — ABNORMAL HIGH (ref 0.44–1.00)
GFR, Estimated: 51 mL/min — ABNORMAL LOW (ref >60)
Glucose, Bld: 116 mg/dL — ABNORMAL HIGH (ref 70–99)
Potassium: 4.1 mmol/L (ref 3.5–5.1)
Sodium: 139 mmol/L (ref 135–145)
Total Bilirubin: 0.7 mg/dL (ref 0.3–1.2)
Total Protein: 7.8 g/dL (ref 6.5–8.1)

## 2022-03-16 LAB — URINALYSIS, ROUTINE W REFLEX MICROSCOPIC
Bilirubin Urine: NEGATIVE
Glucose, UA: NEGATIVE mg/dL
Ketones, ur: NEGATIVE mg/dL
Nitrite: NEGATIVE
Protein, ur: NEGATIVE mg/dL
Specific Gravity, Urine: 1.003 — ABNORMAL LOW (ref 1.005–1.030)
WBC, UA: >50 WBC/hpf — ABNORMAL HIGH (ref 0–5)
pH: 6 (ref 5.0–8.0)

## 2022-03-16 LAB — CBC WITH DIFFERENTIAL/PLATELET
Abs Immature Granulocytes: 0.12 10*3/uL — ABNORMAL HIGH (ref 0.00–0.07)
Basophils Absolute: 0.1 10*3/uL (ref 0.0–0.1)
Basophils Relative: 0 %
Eosinophils Absolute: 0 10*3/uL (ref 0.0–0.5)
Eosinophils Relative: 0 %
HCT: 38.8 % (ref 36.0–46.0)
Hemoglobin: 12.3 g/dL (ref 12.0–15.0)
Immature Granulocytes: 1 %
Lymphocytes Relative: 10 %
Lymphs Abs: 1.9 10*3/uL (ref 0.7–4.0)
MCH: 26.7 pg (ref 26.0–34.0)
MCHC: 31.7 g/dL (ref 30.0–36.0)
MCV: 84.3 fL (ref 80.0–100.0)
Monocytes Absolute: 1.8 10*3/uL — ABNORMAL HIGH (ref 0.1–1.0)
Monocytes Relative: 9 %
Neutro Abs: 15.5 10*3/uL — ABNORMAL HIGH (ref 1.7–7.7)
Neutrophils Relative %: 80 %
Platelets: 269 10*3/uL (ref 150–400)
RBC: 4.6 MIL/uL (ref 3.87–5.11)
RDW: 15.4 % (ref 11.5–15.5)
WBC: 19.3 10*3/uL — ABNORMAL HIGH (ref 4.0–10.5)
nRBC: 0 % (ref 0.0–0.2)

## 2022-03-16 LAB — LACTIC ACID, PLASMA
Lactic Acid, Venous: 1.3 mmol/L (ref 0.5–1.9)
Lactic Acid, Venous: 1 mmol/L (ref 0.5–1.9)
Lactic Acid, Venous: 1.9 mmol/L (ref 0.5–1.9)
Lactic Acid, Venous: 1.1 mmol/L (ref 0.5–1.9)

## 2022-03-16 LAB — APTT: aPTT: 30 seconds (ref 24–36)

## 2022-03-16 LAB — CBG MONITORING, ED: Glucose-Capillary: 94 mg/dL (ref 70–99)

## 2022-03-16 LAB — LIPASE, BLOOD: Lipase: 31 U/L (ref 11–51)

## 2022-03-16 LAB — HIV ANTIBODY (ROUTINE TESTING W REFLEX): HIV Screen 4th Generation wRfx: NONREACTIVE

## 2022-03-16 MED ORDER — BISACODYL 10 MG RE SUPP
10.0000 mg | Freq: Every day | RECTAL | Status: DC
  Filled 2022-03-16: qty 1

## 2022-03-16 MED ORDER — IOHEXOL 300 MG/ML  SOLN
100.0000 mL | Freq: Once | INTRAMUSCULAR | Status: AC | PRN
  Administered 2022-03-16: 100 mL via INTRAVENOUS

## 2022-03-16 MED ORDER — SODIUM CHLORIDE 0.9 % IV SOLN
1.0000 g | Freq: Once | INTRAVENOUS | Status: AC
  Administered 2022-03-16: 1 g via INTRAVENOUS
  Filled 2022-03-16: qty 10

## 2022-03-16 MED ORDER — METOCLOPRAMIDE HCL 5 MG/ML IJ SOLN
5.0000 mg | Freq: Two times a day (BID) | INTRAMUSCULAR | Status: DC
Start: 2022-03-16 — End: 2022-03-16

## 2022-03-16 MED ORDER — ONDANSETRON HCL 4 MG/2ML IJ SOLN
4.0000 mg | Freq: Four times a day (QID) | INTRAMUSCULAR | Status: DC | PRN
  Administered 2022-03-17 – 2022-03-18 (×2): 4 mg via INTRAVENOUS
  Filled 2022-03-16 (×3): qty 2

## 2022-03-16 MED ORDER — PANTOPRAZOLE SODIUM 40 MG IV SOLR
40.0000 mg | Freq: Two times a day (BID) | INTRAVENOUS | Status: DC
  Administered 2022-03-16 – 2022-03-18 (×4): 40 mg via INTRAVENOUS
  Filled 2022-03-16 (×4): qty 10

## 2022-03-16 MED ORDER — FENTANYL CITRATE PF 50 MCG/ML IJ SOSY
50.0000 ug | PREFILLED_SYRINGE | Freq: Once | INTRAMUSCULAR | Status: AC
  Administered 2022-03-16: 50 ug via INTRAVENOUS
  Filled 2022-03-16: qty 1

## 2022-03-16 MED ORDER — METOPROLOL TARTRATE 5 MG/5ML IV SOLN
2.5000 mg | Freq: Three times a day (TID) | INTRAVENOUS | Status: DC
  Administered 2022-03-16 – 2022-03-17 (×3): 2.5 mg via INTRAVENOUS
  Filled 2022-03-16 (×3): qty 5

## 2022-03-16 MED ORDER — SODIUM CHLORIDE 0.9 % IV SOLN
2.0000 g | INTRAVENOUS | Status: DC
  Administered 2022-03-17 – 2022-03-18 (×2): 2 g via INTRAVENOUS
  Filled 2022-03-16 (×2): qty 20

## 2022-03-16 MED ORDER — ONDANSETRON HCL 4 MG/2ML IJ SOLN
4.0000 mg | Freq: Once | INTRAMUSCULAR | Status: AC
  Administered 2022-03-16: 4 mg via INTRAVENOUS
  Filled 2022-03-16: qty 2

## 2022-03-16 MED ORDER — DIPHENHYDRAMINE HCL 50 MG/ML IJ SOLN: 12.5000 mg | Freq: Every evening | INTRAMUSCULAR | Status: DC | PRN

## 2022-03-16 MED ORDER — SODIUM CHLORIDE 0.9 % IV BOLUS
1000.0000 mL | Freq: Once | INTRAVENOUS | Status: AC
  Administered 2022-03-16: 1000 mL via INTRAVENOUS

## 2022-03-16 MED ORDER — METOCLOPRAMIDE HCL 5 MG/ML IJ SOLN
5.0000 mg | Freq: Three times a day (TID) | INTRAMUSCULAR | Status: AC
  Administered 2022-03-16 – 2022-03-17 (×3): 5 mg via INTRAVENOUS
  Filled 2022-03-16 (×3): qty 2

## 2022-03-16 MED ORDER — ACETAMINOPHEN 650 MG RE SUPP
325.0000 mg | RECTAL | Status: DC | PRN
  Administered 2022-03-16: 325 mg via RECTAL
  Filled 2022-03-16: qty 1

## 2022-03-16 MED ORDER — SODIUM CHLORIDE (PF) 0.9 % IJ SOLN
INTRAMUSCULAR | Status: AC
  Filled 2022-03-16: qty 50

## 2022-03-16 MED ORDER — FENTANYL CITRATE PF 50 MCG/ML IJ SOSY: 12.5000 ug | PREFILLED_SYRINGE | INTRAMUSCULAR | Status: DC | PRN

## 2022-03-16 MED ORDER — ACETAMINOPHEN 160 MG/5ML PO SOLN: 325.0000 mg | Freq: Four times a day (QID) | ORAL | Status: DC | PRN

## 2022-03-16 MED ORDER — IOHEXOL 9 MG/ML PO SOLN
500.0000 mL | ORAL | Status: AC
  Administered 2022-03-16 (×2): 500 mL via ORAL

## 2022-03-16 MED ORDER — ENOXAPARIN SODIUM 40 MG/0.4ML IJ SOSY
40.0000 mg | PREFILLED_SYRINGE | INTRAMUSCULAR | Status: DC
Start: 2022-03-16 — End: 2022-03-18
  Administered 2022-03-16 – 2022-03-17 (×2): 40 mg via SUBCUTANEOUS
  Filled 2022-03-16 (×2): qty 0.4

## 2022-03-16 MED ORDER — LACTATED RINGERS IV SOLN: INTRAVENOUS | Status: AC

## 2022-03-16 NOTE — ED Triage Notes (Signed)
Pt states that approximately two weeks ago she had surgery and has been on a clear liquid diet since. Two days ago she started having generalized abdominal pain and N/V. Pt endorses fever of 101.5 approximately two hours prior to arrival. Pt was given 500 mg of tylenol PTA with relief from fever.

## 2022-03-16 NOTE — ED Provider Notes (Addendum)
[facility]-EMERGENCY DEPT Provider Note   CSN: [id number] Arrival date & time: 03/16/22  1132     History  Chief Complaint  Patient presents with   Abdominal Pain   Nausea   Post-op Problem    Martha [name] is a 62 y.o. female.  The history is provided by the patient, the spouse and medical records. No language interpreter was used.  Abdominal Pain Pain location:  Epigastric Pain quality: aching and sharp   Pain radiates to:  Does not radiate Pain severity:  Severe Onset quality:  Gradual Duration:  2 days Timing:  Constant Progression:  Waxing and waning Chronicity:  New Context: previous surgery   Context: not trauma   Relieved by:  Nothing Worsened by:  Eating Ineffective treatments:  None tried Associated symptoms: chills, constipation, cough, diarrhea, fatigue, fever, nausea and vomiting   Associated symptoms: no chest pain, no dysuria and no shortness of breath        Home Medications Prior to Admission medications   Medication Sig Start Date End Date Taking? Authorizing Provider  acetaminophen (TYLENOL) 160 MG/5ML solution Take 31.3 mLs (1,000 mg total) by mouth every 6 (six) hours as needed for mild pain, fever or headache. 02/28/22   Cirigliano, Vito V, DO  acetaminophen (TYLENOL) 500 MG tablet Take 1,000 mg by mouth every 6 (six) hours as needed for mild pain, fever or headache.    [provider]  atorvastatin (LIPITOR) 20 MG tablet Take 1 tablet (20 mg total) by mouth daily. 11/07/21   McGowen, Maryjean Morn, MD  cholecalciferol (VITAMIN D) 25 MCG (1000 UT) tablet Take 1,000 Units by mouth daily. 03/18/17   [provider]  gabapentin (NEURONTIN) 250 MG/5ML solution Take 6 mLs (300 mg total) by mouth every other day for 20 doses. 02/28/22 04/08/22  Cirigliano, Vito V, DO  gabapentin (NEURONTIN) 300 MG capsule Take 300 mg by mouth every other day. 01/26/22   [provider]  levocetirizine (XYZAL) 5 MG tablet Take  5 mg by mouth daily.     [provider]  lidocaine (XYLOCAINE) 2 % solution Use as directed 5 mLs in the mouth or throat every 4 (four) hours as needed for mouth pain (esophageal pain, odynophagia). 02/28/22   Cirigliano, Vito V, DO  lisinopril (ZESTRIL) 10 MG tablet Take 1 tablet (10 mg total) by mouth daily. 07/24/21   McGowen, Maryjean Morn, MD  meloxicam (MOBIC) 15 MG tablet Take 15 mg by mouth every evening. 02/02/22   [provider]  metoCLOPramide (REGLAN) 10 MG tablet Take 1 tablet (10 mg total) by mouth every 8 (eight) hours as needed for nausea. 02/28/22 03/30/22  Cirigliano, Vito V, DO  montelukast (SINGULAIR) 10 MG tablet Take 1 tablet (10 mg total) by mouth at bedtime. 07/24/21   McGowen, Maryjean Morn, MD  Omega-3 Fatty Acids (OMEGA-3 FISH OIL PO) Take 1 capsule by mouth daily.  03/18/17   [provider]  ondansetron (ZOFRAN) 4 MG tablet Take 1 tablet (4 mg total) by mouth every 6 (six) hours as needed for nausea or vomiting. 02/28/22 02/28/23  Cirigliano, Vito V, DO  ondansetron (ZOFRAN) 8 MG tablet Take by mouth every 8 (eight) hours as needed for nausea or vomiting.    [provider]  pantoprazole (PROTONIX) 40 MG tablet Take 1 tablet (40 mg total) by mouth 2 (two) times daily. 02/28/22 04/29/22  Cirigliano, Vito V, DO  Probiotic Product (PROBIOTIC PO) Take 1 capsule by mouth daily.  [provider]  senna (SENOKOT) 8.6 MG TABS tablet Take 1 tablet by mouth at bedtime.    [provider]  simethicone (MYLICON) 80 MG chewable tablet Chew 1 tablet (80 mg total) by mouth every 6 (six) hours as needed for flatulence (gas, bloating, abdominal discomfort). 02/28/22   Cirigliano, Vito V, DO      Allergies    Codeine    Review of Systems   Review of Systems  Constitutional:  Positive for chills, fatigue and fever. Negative for diaphoresis.  HENT:  Negative for congestion.   Eyes:  Negative for visual disturbance.  Respiratory:  Positive for  cough. Negative for chest tightness, shortness of breath and wheezing.   Cardiovascular:  Negative for chest pain and palpitations.  Gastrointestinal:  Positive for abdominal pain, constipation, diarrhea, nausea and vomiting.  Genitourinary:  Positive for frequency. Negative for dysuria and flank pain.  Musculoskeletal:  Negative for back pain, neck pain and neck stiffness.  Skin:  Negative for rash.  Neurological:  Negative for light-headedness and headaches.  Psychiatric/Behavioral:  Negative for agitation and confusion.   All other systems reviewed and are negative.   Physical Exam Updated Vital Signs BP 107/78   Pulse (!) 101   Temp 98.6 F (37 C) (Oral)   Resp 15   SpO2 95%  Physical Exam Vitals and nursing note reviewed.  Constitutional:      General: She is not in acute distress.    Appearance: She is well-developed. She is not ill-appearing, toxic-appearing or diaphoretic.  HENT:     Head: Normocephalic and atraumatic.  Eyes:     General: No scleral icterus.    Extraocular Movements: Extraocular movements intact.     Conjunctiva/sclera: Conjunctivae normal.     Pupils: Pupils are equal, round, and reactive to light.  Cardiovascular:     Rate and Rhythm: Regular rhythm. Tachycardia present.     Heart sounds: No murmur heard. Pulmonary:     Effort: Pulmonary effort is normal. No respiratory distress.     Breath sounds: Normal breath sounds.  Abdominal:     General: Abdomen is flat. Bowel sounds are normal. There is no distension.     Palpations: Abdomen is soft.     Tenderness: There is abdominal tenderness in the right upper quadrant, epigastric area and left upper quadrant.  Musculoskeletal:        General: No swelling.     Cervical back: Neck supple.  Skin:    General: Skin is warm and dry.     Capillary Refill: Capillary refill takes less than 2 seconds.     Coloration: Skin is not pale.  Neurological:     Mental Status: She is alert.  Psychiatric:         Mood and Affect: Mood normal.     ED Results / Procedures / Treatments   Labs (all labs ordered are listed, but only abnormal results are displayed) Labs Reviewed  CBC WITH DIFFERENTIAL/PLATELET - Abnormal; Notable for the following components:      Result Value   WBC 19.3 (*)    Neutro Abs 15.5 (*)    Monocytes Absolute 1.8 (*)    Abs Immature Granulocytes 0.12 (*)    All other components within normal limits  COMPREHENSIVE METABOLIC PANEL - Abnormal; Notable for the following components:   Glucose, Bld 116 (*)    Creatinine, Ser 1.21 (*)    GFR, Estimated 51 (*)    All other components within normal limits  URINALYSIS, ROUTINE W REFLEX MICROSCOPIC - Abnormal; Notable for the following components:   Color, Urine STRAW (*)    APPearance HAZY (*)    Specific Gravity, Urine 1.003 (*)    Hgb urine dipstick SMALL (*)    Leukocytes,Ua LARGE (*)    WBC, UA >50 (*)    Bacteria, UA MANY (*)    All other components within normal limits  CULTURE, BLOOD (ROUTINE X 2)  CULTURE, BLOOD (ROUTINE X 2)  URINE CULTURE  LACTIC ACID, PLASMA  LACTIC ACID, PLASMA  LIPASE, BLOOD  CBG MONITORING, ED    EKG EKG Interpretation  Date/Time:  Friday March 16 2022 11:56:25 EDT Ventricular Rate:  96 PR Interval:  144 QRS Duration: 130 QT Interval:  393 QTC Calculation: 497 R Axis:   67 Text Interpretation: Sinus rhythm Right bundle branch block Inferior infarct, age indeterminate when compared to prior, faster rate but otherwise similar appearance. No sTEMI Confirmed by Theda Belfast ([id number]) on 03/16/2022 1:02:29 PM  Radiology CT ABDOMEN PELVIS W CONTRAST  Result Date: 03/16/2022 CLINICAL DATA:  Abdominal pain, postop nausea and vomiting EXAM: CT ABDOMEN AND PELVIS WITH CONTRAST TECHNIQUE: Multidetector CT imaging of the abdomen and pelvis was performed using the standard protocol following bolus administration of intravenous contrast. RADIATION DOSE REDUCTION: This exam was performed according to  the departmental dose-optimization program which includes automated exposure control, adjustment of the mA and/or kV according to patient size and/or use of iterative reconstruction technique. CONTRAST:  OMNIPAQUE IOHEXOL 300 MG/ML  SOLN COMPARISON:  None Available. FINDINGS: Lower chest: Bibasilar atelectasis. Hepatobiliary: No focal liver abnormality is seen. Low-attenuation of the liver as can be seen with hepatic steatosis. Prior cholecystectomy. Pancreas: Unremarkable. No pancreatic ductal dilatation or surrounding inflammatory changes. Spleen: Normal in size without focal abnormality. Adrenals/Urinary Tract: Adrenal glands are unremarkable. Punctate nonobstructing right renal calculus. No obstructive uropathy. Mild urothelial enhancement of the renal pelvis and ureter, small focal hypoenhancing area of the interpolar aspect, and right perinephric stranding as can be seen with pyelonephritis. Correlate with urinalysis. Bladder is unremarkable. Stomach/Bowel: Stomach is within normal limits. No hiatal hernia. No evidence of bowel wall thickening, distention, or inflammatory changes. Appendix is normal. Sigmoid diverticulosis without evidence of diverticulitis. Vascular/Lymphatic: Aortic atherosclerosis. No enlarged abdominal or pelvic lymph nodes. Reproductive: Status post hysterectomy. No adnexal masses. Other: No abdominal wall hernia or abnormality. Trace pelvic free fluid. Musculoskeletal: No acute osseous abnormality. No aggressive osseous lesion. Degenerative disease with disc height loss L5-S1 with bilateral foraminal stenosis. IMPRESSION: 1. Mild urothelial enhancement of the renal pelvis and ureter, small focal hypoenhancing area of the interpolar aspect, and right perinephric stranding as can be seen with pyelonephritis. Correlate with urinalysis. 2. Otherwise, no acute abdominal or pelvic pathology. 3. Sigmoid diverticulosis without evidence of diverticulitis. 4. Hepatic steatosis. 5. Aortic  Atherosclerosis (ICD10-I70.0). Electronically Signed   By: Elige Ko M.D.   On: 03/16/2022 15:42   DG Chest Portable 1 View  Result Date: 03/16/2022 CLINICAL DATA:  Fever, epigastric pain, recent surgery EXAM: PORTABLE CHEST 1 VIEW COMPARISON:  None Available. FINDINGS: The heart size and mediastinal contours are within normal limits. Both lungs are clear. The visualized skeletal structures are unremarkable. IMPRESSION: No acute abnormality of the lungs in AP portable projection. Electronically Signed   By: Jearld Lesch M.D.   On: 03/16/2022 13:20    Procedures Procedures    Medications Ordered in ED Medications  cefTRIAXone (ROCEPHIN) 1 g in sodium chloride 0.9 % 100 mL IVPB (  1 g Intravenous New Bag/Given 03/16/22 1651)  sodium chloride 0.9 % bolus 1,000 mL (has no administration in time range)  ondansetron (ZOFRAN) injection 4 mg (has no administration in time range)  sodium chloride 0.9 % bolus 1,000 mL (0 mLs Intravenous Stopped 03/16/22 1448)  fentaNYL (SUBLIMAZE) injection 50 mcg (50 mcg Intravenous Given 03/16/22 1242)  ondansetron (ZOFRAN) injection 4 mg (4 mg Intravenous Given 03/16/22 1241)  iohexol (OMNIPAQUE) 9 MG/ML oral solution 500 mL (500 mLs Oral Contrast Given 03/16/22 1409)  iohexol (OMNIPAQUE) 300 MG/ML solution 100 mL (100 mLs Intravenous Contrast Given 03/16/22 1509)  sodium chloride (PF) 0.9 % injection (  Given by Other 03/16/22 1522)    ED Course/ Medical Decision Making/ A&P                           Medical Decision Making Amount and/or Complexity of Data Reviewed Labs: ordered. Radiology: ordered.  Risk Prescription drug management. Decision regarding hospitalization.    Martha [name] is a 61 y.o. female with a past medical history significant for hypertension, hypercholesterolemia, GERD, diabetes, and recent laparoscopic hiatal hernia surgery/transoral incision was fundoplication on 02/27/2022 (about 2.5 weeks ago) who presents with nausea, vomiting,  severe abdominal pain, intermittent distention, fevers, and chills.  According to patient, she had done very well post procedurally for the last 2 and half weeks.  She says that she has been using the clearest diet and until yesterday had no problems.  She reports on Wednesday, 2 days ago, she started having some abdominal discomfort and then it rapidly worsened to a 10 out of 10 in severity with nausea and vomiting.  She reports no blood in her emesis but had several episodes of vomiting.  She said that yesterday her symptoms had slightly improved but she still had some epigastric discomfort but then today worsened again with again nausea and vomiting.  She reports she has intermittently had some constipation and diarrhea but has still been passing gas.  She reports this morning had a fever of 101.5 and was having shaking chills.  She took some Tylenol and came in for evaluation after being told by her [place] [facility] team.  Patient reports has had a dry cough but has had no production in the cough.  She reports no chest pain or shortness of breath.  She denies any urinary changes.  She is still passing gas and having bowel movements but is very concerned about the abdominal pain.  On exam, abdomen is tender to palpation.  Chest was nontender.  No murmur.  I did appreciate some bowel sounds.  Back and flanks nontender.  Patient was slightly tachycardic on arrival to the emergency department but was afebrile and not tachypneic on my exam.  Clinically I am concerned about the patient having a complication with her recent surgery.  Differential diagnosis includes postoperative abscess, leak, some sort of herniation or bowel obstruction, or even something like a gastroenteritis on top of her recent procedure.  I spoke to radiology who recommended CT abdomen pelvis with IV and oral contrast to look for a leak or other surgical complication.  We will get a chest x-ray.  He did not feel that CT chest was needed given  lack of chest pain.  We will get screening labs, cultures, and urine cultures.  She did report some mild urinary frequency but denies dysuria.  Once work-up has been completed, anticipate consultation with either GI or general surgery  team if there are abnormalities seen.  She was given pain medicine, nausea medicine, and fluids initially.  4:16 PM Work-up began to return and imaging and labs show evidence of likely pyelonephritis causing nausea and vomiting exacerbating her abdominal pain.  I personally looked at and spoke on the phone with the radiology team who reviewed the CT scans and they do not see any evidence of complication or problem with the patient's surgical area with a hiatal hernia and with the fundoplication.   Given the patient's recent surgery, intolerance of p.o., and pyelonephritis, I do not feel she is safe for discharge home for oral antibiotic management.  Will order IV antibiotics and will call for admission for symptom control, hydration, and antibiotic management.         Final Clinical Impression(s) / ED Diagnoses Final diagnoses:  Pyelonephritis  Epigastric pain  Nausea and vomiting, unspecified vomiting type     Clinical Impression: 1. Pyelonephritis   2. Epigastric pain   3. Nausea and vomiting, unspecified vomiting type     Disposition: Admit  This note was prepared with assistance of Dragon voice recognition software. Occasional wrong-word or sound-a-like substitutions may have occurred due to the inherent limitations of voice recognition software.      Avri Paiva, Canary Brim, MD 03/16/22 1655    Yomaris Palecek, Canary Brim, MD 03/16/22 1655

## 2022-03-16 NOTE — ED Notes (Signed)
Patient began to have chills followed by uncontrolled shivers. Roda Shutters, MD notified. Rocephin and 1000 of normal saline bolus started.

## 2022-03-16 NOTE — Sepsis Progress Note (Signed)
eLink is following this Code Sepsis. °

## 2022-03-16 NOTE — Telephone Encounter (Signed)
Martha [name] can you please call this patient ASAP to get more information. Can she tolerate any PO? Is she in any pain? Does she have any other infectious symptoms, cough, shortness of breath, runny nose, etc? If she cannot tolerate PO and feeling dehydrated, or with pain, etc, she may need to go to the hospital for further evaluation and triage.  Not sure if this is complication from procedure or if she has an infection otherwise with the fever. If she feels that bad the ED may be the best place to further evaluate her.  Can you let me know what she says and I can provide further recommendations.

## 2022-03-16 NOTE — Progress Notes (Addendum)
   03/16/22 1956  Vitals  Temp 99.8 F (37.7 C)  Temp Source Oral  BP 125/72  MAP (mmHg) 88  BP Location Right Arm  BP Method Automatic  Patient Position (if appropriate) Lying  Pulse Rate (!) 123  Pulse Rate Source Monitor  Resp 20  MEWS COLOR  MEWS Score Color Yellow  Oxygen Therapy  SpO2 94 %  O2 Device Room Air  MEWS Score  MEWS Temp 0  MEWS Systolic 0  MEWS Pulse 2  MEWS RR 0  MEWS LOC 0  MEWS Score 2   Suppository tylenol administered at 18:02. Ice applied to patient and temperature in the room lowered, per Rapid Response RN recommendation.   Sinclair Ship, RN

## 2022-03-16 NOTE — Progress Notes (Signed)
   03/16/22 1742  Assess: MEWS Score  Temp (!) 103.2 F (39.6 C)  BP 136/73  MAP (mmHg) 88  Pulse Rate (!) 119  Resp (!) 22  SpO2 95 %  O2 Device Room Air  Assess: MEWS Score  MEWS Temp 2  MEWS Systolic 0  MEWS Pulse 2  MEWS RR 1  MEWS LOC 0  MEWS Score 5  MEWS Score Color Red  Take Vital Signs  Increase Vital Sign Frequency  Red: Q 1hr X 4 then Q 4hr X 4, if remains red, continue Q 4hrs  Escalate  MEWS: Escalate Red: discuss with charge nurse/RN and provider, consider discussing with RRT  Notify: Charge Nurse/RN  Name of Charge Nurse/RN Notified Marissa, RN  Date Charge Nurse/RN Notified 03/16/22  Time Charge Nurse/RN Notified 1748  Notify: Provider  Provider Name/Title Dr. Roda Shutters  Date Provider Notified 03/16/22  Time Provider Notified 1742  Method of Notification Call  Notification Reason Critical result (103.2 temp)  Provider response See new orders  Date of Provider Response 03/16/22  Time of Provider Response 1742  Assess: SIRS CRITERIA  SIRS Temperature  1  SIRS Pulse 1  SIRS Respirations  1  SIRS WBC 0  SIRS Score Sum  3   Sinclair Ship, RN

## 2022-03-16 NOTE — H&P (Signed)
History and Physical    Martha [name] MLJ:[id number] DOB: 1961/04/21 DOA: 03/16/2022  PCP: Jeoffrey Massed, MD Patient coming from: home  I have personally briefly reviewed patient's old medical records in [facility]  Chief Complaint: n/v/ab pain/fever  HPI: Martha [name] is a 61 y.o. female with medical history significant of HTN, HLD, hiatal hernia s/p laparoscopic hiatal hernia repair and TIF on 5/23, has been on a clear liquid diet since. Two days ago she started having generalized abdominal pain and N/V. developed fever of 101.5 today prior to coming to the ED,  approximately two hours prior to arrival    ED Course:  Data reviewed: Blood pressure 120/72, pulse 91, temperature 98.6 F (37 C), temperature source Oral, resp. rate (!) 23, SpO2 97 %. WBC 19.3 Creatinine at admission 1.2 (1.12 on 02/22/2022,  0.9  on 11/14/2021) Unremarkable LFT and lipase Normal lactic acid UA with large leukocyte, many bacteria, negative nitrite  EKG: Independently reviewed.  Sinus rhythm, no acute ST-T changes, chronic RBBB  CXR ; no acute findings  Ct ab/pel with contrast: 1. Mild urothelial enhancement of the renal pelvis and ureter, small focal hypoenhancing area of the interpolar aspect, and right perinephric stranding as can be seen with pyelonephritis. Correlate with urinalysis. 2. Otherwise, no acute abdominal or pelvic pathology. 3. Sigmoid diverticulosis without evidence of diverticulitis. 4. Hepatic steatosis. 5. Aortic Atherosclerosis  Blood culture and urine culture obtained in the ED, she received 1 dose of Rocephin and 1 L of normal saline bolus, hospitalist called to admit the patient  Review of Systems: As per HPI otherwise all other systems reviewed and are negative.   Past Medical History:  Diagnosis Date   Allergic rhinitis    Anemia    Arthralgia of multiple sites    Gen rheum lab w/u normal/neg 11/2016 by prior PCP   Constipation    DDD  (degenerative disc disease), lumbar 11/2020   R glut/hip pain->ortho, MRI->disc causing pinched nerve at L3-4 level on R, signif dz on L at L4-5 and L5-S1--->ESI on R 11/2020 very helpful, +PT.   Diabetes mellitus (HCC) 2022   Prediab 2017 --Old PCP records state only that her Hba1c was 6.1% in 2017: she took metformin x ? 6-12 mo, rx'd by wt loss MD.  A1c 6.1% Nov 2019. A1c 6.2% 12/2019. A1c 6.8% 12/2020.   GERD (gastroesophageal reflux disease)    +grade A esophagitis 06/2019 EGD.  Bx neg for eosinophilic esoph. 08/2019 esoph manometry normal, pH probe + abnl reflux not fully suppressed by PPI. PPI BID + H2 blocker as of 08/2019.   History of rectal bleeding    remote past->? rectal ulcer vs perf'd divertic-->need old records for clarif.   Hypercholesterolemia    Past hx of statin use, then was able to come off meds when she lost wt   Hypertension    Insomnia    Joint pain    Obesity, Class I, BMI 30-34.9    Palpitations    Right hip pain    MRI showed labral tear, got steroid injection. Also troch bursitis.   Right shoulder pain 05/2020   RC tear, bicipital pathology, AC arthrosis and impingement-->to have surgery    Past Surgical History:  Procedure Laterality Date   33 HOUR PH STUDY N/A 08/05/2019   +GERD. Procedure: 24 HOUR PH STUDY;  Surgeon: Tressia Danas, MD;  Location: [facility];  Service: Gastroenterology;  Laterality: N/A;   ABDOMINAL HYSTERECTOMY  2005  for benign dx (No BSO)   BREAST BIOPSY Right 2005   CESAREAN SECTION     x 2   CHOLECYSTECTOMY  2011   COLONOSCOPY  most recent 01/2012   x 2->approx 2011 was done for BRBPR, unclear dx (rectal ulcer? perf'd diverticulum?).  Another about 2015 for abd pain w/u?  No polyps detected on either colonoscopy.  Need old records for clarification (GI MD was Dr. Candiss [place] in [place])--prior pcp records say "colonoscopy 01/2012 negative".   ESOPHAGEAL MANOMETRY N/A 08/05/2019   NORMAL Procedure: ESOPHAGEAL MANOMETRY (EM);  Surgeon:  Thornton Park, MD;  Location: [facility];  Service: Gastroenterology;  Laterality: N/A;   ESOPHAGOGASTRODUODENOSCOPY  06/2019     06/2019->reflux esophagitis, o/w normal.   ESOPHAGOGASTRODUODENOSCOPY N/A 02/27/2022   Procedure: ESOPHAGOGASTRODUODENOSCOPY (EGD);  Surgeon: Lavena Bullion, DO;  Location: [facility];  Service: Gastroenterology;  Laterality: N/A;   HIATAL HERNIA REPAIR N/A 02/27/2022   Procedure: LAPAROSCOPIC REPAIR OF HIATAL HERNIA;  Surgeon: Greer Pickerel, MD;  Location: [facility];  Service: General;  Laterality: N/A;   ROTATOR CUFF REPAIR Right 06/2021   TONSILLECTOMY AND ADENOIDECTOMY  2009   TRANSORAL INCISIONLESS FUNDOPLICATION N/A 0000000   Procedure: TRANSORAL INCISIONLESS FUNDOPLICATION;  Surgeon: Lavena Bullion, DO;  Location: [facility];  Service: Gastroenterology;  Laterality: N/A;    Social History  reports that she has never smoked. She has never used smokeless tobacco. She reports current alcohol use. She reports that she does not use drugs.  Allergies  Allergen Reactions   Codeine Anaphylaxis    Family History  Problem Relation Age of Onset   Diabetes Mother    Alcohol abuse Mother    High blood pressure Mother    Rheumatic fever Father    Heart attack Father    Diabetes Brother    Breast cancer Maternal Aunt    Colon cancer Neg Hx    Esophageal cancer Neg Hx    Rectal cancer Neg Hx    Stomach cancer Neg Hx     Prior to Admission medications   Medication Sig Start Date End Date Taking? Authorizing Provider  acetaminophen (TYLENOL) 160 MG/5ML solution Take 31.3 mLs (1,000 mg total) by mouth every 6 (six) hours as needed for mild pain, fever or headache. 02/28/22   Cirigliano, Vito V, DO  acetaminophen (TYLENOL) 500 MG tablet Take 1,000 mg by mouth every 6 (six) hours as needed for mild pain, fever or headache.    [provider]  atorvastatin (LIPITOR) 20 MG tablet Take 1 tablet (20 mg total) by mouth daily. 11/07/21   McGowen, Adrian Blackwater, MD   cholecalciferol (VITAMIN D) 25 MCG (1000 UT) tablet Take 1,000 Units by mouth daily. 03/18/17   [provider]  gabapentin (NEURONTIN) 250 MG/5ML solution Take 6 mLs (300 mg total) by mouth every other day for 20 doses. 02/28/22 04/08/22  Cirigliano, Vito V, DO  gabapentin (NEURONTIN) 300 MG capsule Take 300 mg by mouth every other day. 01/26/22   [provider]  levocetirizine (XYZAL) 5 MG tablet Take 5 mg by mouth daily.     [provider]  lidocaine (XYLOCAINE) 2 % solution Use as directed 5 mLs in the mouth or throat every 4 (four) hours as needed for mouth pain (esophageal pain, odynophagia). 02/28/22   Cirigliano, Vito V, DO  lisinopril (ZESTRIL) 10 MG tablet Take 1 tablet (10 mg total) by mouth daily. 07/24/21   McGowen, Adrian Blackwater, MD  meloxicam (MOBIC) 15 MG tablet Take 15 mg  by mouth every evening. 02/02/22   [provider]  metoCLOPramide (REGLAN) 10 MG tablet Take 1 tablet (10 mg total) by mouth every 8 (eight) hours as needed for nausea. 02/28/22 03/30/22  Cirigliano, Vito V, DO  montelukast (SINGULAIR) 10 MG tablet Take 1 tablet (10 mg total) by mouth at bedtime. 07/24/21   McGowen, Adrian Blackwater, MD  Omega-3 Fatty Acids (OMEGA-3 FISH OIL PO) Take 1 capsule by mouth daily.  03/18/17   [provider]  ondansetron (ZOFRAN) 4 MG tablet Take 1 tablet (4 mg total) by mouth every 6 (six) hours as needed for nausea or vomiting. 02/28/22 02/28/23  Cirigliano, Vito V, DO  ondansetron (ZOFRAN) 8 MG tablet Take by mouth every 8 (eight) hours as needed for nausea or vomiting.    [provider]  pantoprazole (PROTONIX) 40 MG tablet Take 1 tablet (40 mg total) by mouth 2 (two) times daily. 02/28/22 04/29/22  Cirigliano, Vito V, DO  Probiotic Product (PROBIOTIC PO) Take 1 capsule by mouth daily.    [provider]  senna (SENOKOT) 8.6 MG TABS tablet Take 1 tablet by mouth at bedtime.    [provider]  simethicone (MYLICON) 80 MG chewable  tablet Chew 1 tablet (80 mg total) by mouth every 6 (six) hours as needed for flatulence (gas, bloating, abdominal discomfort). 02/28/22   Lavena Bullion, DO    Physical Exam: Vitals:   03/16/22 1500 03/16/22 1538 03/16/22 1700 03/16/22 1742  BP:  120/72 (!) 180/92 136/73  Pulse: 85 91 (!) 130 (!) 119  Resp: 18 (!) 23 (!) 31 (!) 22  Temp:   98.4 F (36.9 C) (!) 103.2 F (39.6 C)  TempSrc:   Oral Oral  SpO2: 99% 97% 100% 95%    Constitutional: Does not appear comfortable, shaking, dry heaving, oriented x3 Eyes: PERRL, lids and conjunctivae normal ENMT: Dry oral mucous member Respiratory: clear to auscultation bilaterally, no wheezing, no crackles.  Tachypnea. No accessory muscle use.  Cardiovascular: Sinus tachycardia , No extremity edema. 2+ pedal pulses. No carotid bruits.  Abdomen: ab pain,  not distended, +Bowel sounds  Musculoskeletal: no clubbing / cyanosis. No joint deformity upper and lower extremities. Good ROM, no contractures. Normal muscle tone.  Skin: no rashes, lesions, ulcers. No induration Neurologic: CN 2-12 grossly intact. Sensation intact, Strength 5/5 in all 4.  Psychiatric: Anxious   Labs on Admission: I have personally reviewed following labs and imaging studies  CBC: Recent Labs  Lab 03/16/22 1225  WBC 19.3*  NEUTROABS 15.5*  HGB 12.3  HCT 38.8  MCV 84.3  PLT [id number]    Basic Metabolic Panel: Recent Labs  Lab 03/16/22 1225  NA 139  K 4.1  CL 107  CO2 22  GLUCOSE 116*  BUN 14  CREATININE 1.21*  CALCIUM 9.3    GFR: CrCl cannot be calculated (Unknown ideal weight.).  Liver Function Tests: Recent Labs  Lab 03/16/22 1225  AST 17  ALT 20  ALKPHOS 79  BILITOT 0.7  PROT 7.8  ALBUMIN 4.0    Urine analysis:    Component Value Date/Time   COLORURINE STRAW (A) 03/16/2022 1227   APPEARANCEUR HAZY (A) 03/16/2022 1227   LABSPEC 1.003 (L) 03/16/2022 1227   PHURINE 6.0 03/16/2022 1227   GLUCOSEU NEGATIVE 03/16/2022 1227   [place]  (A) 03/16/2022 1227   BILIRUBINUR NEGATIVE 03/16/2022 [place] 03/16/2022 1227   PROTEINUR NEGATIVE 03/16/2022 1227   NITRITE NEGATIVE 03/16/2022 1227   LEUKOCYTESUR  LARGE (A) 03/16/2022 1227    Radiological Exams on Admission: CT ABDOMEN PELVIS W CONTRAST  Result Date: 03/16/2022 CLINICAL DATA:  Abdominal pain, postop nausea and vomiting EXAM: CT ABDOMEN AND PELVIS WITH CONTRAST TECHNIQUE: Multidetector CT imaging of the abdomen and pelvis was performed using the standard protocol following bolus administration of intravenous contrast. RADIATION DOSE REDUCTION: This exam was performed according to the departmental dose-optimization program which includes automated exposure control, adjustment of the mA and/or kV according to patient size and/or use of iterative reconstruction technique. CONTRAST:  176mL OMNIPAQUE IOHEXOL 300 MG/ML  SOLN COMPARISON:  None Available. FINDINGS: Lower chest: Bibasilar atelectasis. Hepatobiliary: No focal liver abnormality is seen. Low-attenuation of the liver as can be seen with hepatic steatosis. Prior cholecystectomy. Pancreas: Unremarkable. No pancreatic ductal dilatation or surrounding inflammatory changes. Spleen: Normal in size without focal abnormality. Adrenals/Urinary Tract: Adrenal glands are unremarkable. Punctate nonobstructing right renal calculus. No obstructive uropathy. Mild urothelial enhancement of the renal pelvis and ureter, small focal hypoenhancing area of the interpolar aspect, and right perinephric stranding as can be seen with pyelonephritis. Correlate with urinalysis. Bladder is unremarkable. Stomach/Bowel: Stomach is within normal limits. No hiatal hernia. No evidence of bowel wall thickening, distention, or inflammatory changes. Appendix is normal. Sigmoid diverticulosis without evidence of diverticulitis. Vascular/Lymphatic: Aortic atherosclerosis. No enlarged abdominal or pelvic lymph nodes. Reproductive: Status post  hysterectomy. No adnexal masses. Other: No abdominal wall hernia or abnormality. Trace pelvic free fluid. Musculoskeletal: No acute osseous abnormality. No aggressive osseous lesion. Degenerative disease with disc height loss L5-S1 with bilateral foraminal stenosis. IMPRESSION: 1. Mild urothelial enhancement of the renal pelvis and ureter, small focal hypoenhancing area of the interpolar aspect, and right perinephric stranding as can be seen with pyelonephritis. Correlate with urinalysis. 2. Otherwise, no acute abdominal or pelvic pathology. 3. Sigmoid diverticulosis without evidence of diverticulitis. 4. Hepatic steatosis. 5. Aortic Atherosclerosis (ICD10-I70.0). Electronically Signed   By: Kathreen Devoid M.D.   On: 03/16/2022 15:42   DG Chest Portable 1 View  Result Date: 03/16/2022 CLINICAL DATA:  Fever, epigastric pain, recent surgery EXAM: PORTABLE CHEST 1 VIEW COMPARISON:  None Available. FINDINGS: The heart size and mediastinal contours are within normal limits. Both lungs are clear. The visualized skeletal structures are unremarkable. IMPRESSION: No acute abnormality of the lungs in AP portable projection. Electronically Signed   By: Delanna Ahmadi M.D.   On: 03/16/2022 13:20      Assessment/Plan Principal Problem:   Pyelonephritis Active Problems:   Gastroesophageal reflux disease   Hiatal hernia   HTN (hypertension)   Sepsis (HCC)   UTI/pyelonephritis/sepsis -Present with fever, tachycardia , tachypnea , leukocytosis ,abdominal pain, nausea and vomiting -Appear dehydrated, received total 2 L of normal saline in the ED , start LR at 100 cc/h , IV Rocephin 2 g daily, as needed Iv analgesic and  IV antiemetics -N.p.o. except ice chips for now due to severe dry heave  -Follow-up on urine and blood culture result  AKI ? Cr appear normal earlier this year treat UTI pyelonephritis and dehydration Repeat BMP in the morning Renal dosing meds  H/o hiatal hernia status post recent  laparoscopic repair and TIF procedure on 5/25 -Case discussed with GI Dr. Rush Landmark, currently does not need formal GI consult prefer to not repeat EGD during this hospitalization -Hopefully symptoms improve with treating UTI/ pyelonephritis, however GI can be formally consulted if persistent nausea and vomiting -change to iv ppi  HTN Hold lisinopril in the setting of elevated creatinine and  nausea vomiting Change to IV Lopressor with holding parameters  Hyperlipidemia Hold statin for now, resume when able to tolerate p.o.  Constipation: Agreed to suppository due to not able to tolerate oral meds   DVT prophylaxis: enoxaparin (LOVENOX) injection 40 mg Start: 03/16/22 2000 SCDs Start: 03/16/22 [id number]   Code Status:   full Family Communication:  Husband updated at bedside  Patient is from: home   Anticipated DC to:  home  Anticipated DC date:  Greater than 24 hours, treating sepsis, follow-up on urine culture result   Consults called:  Case discussed with [facility] Admission status:    Severity of Illness: The appropriate patient status for this patient is INPATIENT. Inpatient status is judged to be reasonable and necessary in order to provide the required intensity of service to ensure the patient's safety. The patient's presenting symptoms, physical exam findings, and initial radiographic and laboratory data in the context of their chronic comorbidities is felt to place them at high risk for further clinical deterioration. Furthermore, it is not anticipated that the patient will be medically stable for discharge from the hospital within 2 midnights of admission.   * I certify that at the point of admission it is my clinical judgment that the patient will require inpatient hospital care spanning beyond 2 midnights from the point of admission due to high intensity of service, high risk for further deterioration and high frequency of surveillance required.*    Voice Recognition  Viviann Spare dictation system was used to create this note, attempts have been made to correct errors. Please contact the author with questions and/or clarifications.  Florencia Reasons MD PhD FACP [facility]  How to contact the [facility] Attending or Consulting provider [place] or covering provider during after hours [place], for this patient?   Check the care team in [facility] and look for a) attending/consulting [facility] provider listed and b) the [facility] team listed Log into [email or web address] and use [facility]'s universal password to access. If you do not have the password, please contact the hospital operator. Locate the [facility] provider you are looking for under [facility] and page to a number that you can be directly reached. If you still have difficulty reaching the provider, please page the [facility] (Director on Call) for the Hospitalists listed on amion for assistance.  03/16/2022, 6:05 PM

## 2022-03-16 NOTE — Telephone Encounter (Signed)
Okay that sounds reasonable. Thanks

## 2022-03-16 NOTE — Telephone Encounter (Signed)
Patient's husband called back to let the office now he just took her temperature and she has a fever of 101.5.

## 2022-03-16 NOTE — Telephone Encounter (Signed)
Inbound call from patient stating she is now in stage two of her clear liquid diet for a procedure she had on 02/27/22. Patient states she is experiencing excessive vomiting also shivers feeling clammy and her skin is turning white. Patient states she has been taking her medication but the medication is not helping. Please call patient back to advise.  Thank you

## 2022-03-16 NOTE — Telephone Encounter (Signed)
I previously spoke with pt and pt going to ED. She had a small amount of broth but it sounded like because of the vomiting she hasn't been able to keep up adequate intake. Symptoms started yesterday and pt's husband stated that pt had done pretty well until then but has gotten progressively worse since yesterday.  Advised pt's husband to take pt to ED for evaluation.

## 2022-03-17 DIAGNOSIS — N12 Tubulo-interstitial nephritis, not specified as acute or chronic: Secondary | ICD-10-CM | POA: Diagnosis not present

## 2022-03-17 LAB — CBC WITH DIFFERENTIAL/PLATELET
Abs Immature Granulocytes: 0.12 10*3/uL — ABNORMAL HIGH (ref 0.00–0.07)
Basophils Absolute: 0.1 10*3/uL (ref 0.0–0.1)
Basophils Relative: 0 %
Eosinophils Absolute: 0 10*3/uL (ref 0.0–0.5)
Eosinophils Relative: 0 %
HCT: 31.4 % — ABNORMAL LOW (ref 36.0–46.0)
Hemoglobin: 9.8 g/dL — ABNORMAL LOW (ref 12.0–15.0)
Immature Granulocytes: 1 %
Lymphocytes Relative: 14 %
Lymphs Abs: 2.6 10*3/uL (ref 0.7–4.0)
MCH: 26.3 pg (ref 26.0–34.0)
MCHC: 31.2 g/dL (ref 30.0–36.0)
MCV: 84.2 fL (ref 80.0–100.0)
Monocytes Absolute: 1.2 10*3/uL — ABNORMAL HIGH (ref 0.1–1.0)
Monocytes Relative: 6 %
Neutro Abs: 14.7 10*3/uL — ABNORMAL HIGH (ref 1.7–7.7)
Neutrophils Relative %: 79 %
Platelets: 214 10*3/uL (ref 150–400)
RBC: 3.73 MIL/uL — ABNORMAL LOW (ref 3.87–5.11)
RDW: 15.4 % (ref 11.5–15.5)
WBC: 18.7 10*3/uL — ABNORMAL HIGH (ref 4.0–10.5)
nRBC: 0 % (ref 0.0–0.2)

## 2022-03-17 LAB — BASIC METABOLIC PANEL
Anion gap: 6 (ref 5–15)
BUN: 10 mg/dL (ref 8–23)
CO2: 24 mmol/L (ref 22–32)
Calcium: 8.5 mg/dL — ABNORMAL LOW (ref 8.9–10.3)
Chloride: 113 mmol/L — ABNORMAL HIGH (ref 98–111)
Creatinine, Ser: 0.83 mg/dL (ref 0.44–1.00)
GFR, Estimated: >60 mL/min (ref >60)
Glucose, Bld: 89 mg/dL (ref 70–99)
Potassium: 4.1 mmol/L (ref 3.5–5.1)
Sodium: 143 mmol/L (ref 135–145)

## 2022-03-17 LAB — MAGNESIUM: Magnesium: 2 mg/dL (ref 1.7–2.4)

## 2022-03-17 MED ORDER — METOPROLOL TARTRATE 5 MG/5ML IV SOLN: 2.5000 mg | Freq: Three times a day (TID) | INTRAVENOUS | Status: DC | PRN

## 2022-03-17 MED ORDER — BOOST / RESOURCE BREEZE PO LIQD CUSTOM
1.0000 | Freq: Three times a day (TID) | ORAL | Status: DC
  Administered 2022-03-17 – 2022-03-18 (×4): 1 via ORAL

## 2022-03-17 MED ORDER — PROSOURCE PLUS PO LIQD
30.0000 mL | Freq: Two times a day (BID) | ORAL | Status: DC
  Administered 2022-03-17 – 2022-03-18 (×4): 30 mL via ORAL
  Filled 2022-03-17 (×4): qty 30

## 2022-03-17 MED ORDER — ADULT MULTIVITAMIN W/MINERALS CH
1.0000 | ORAL_TABLET | Freq: Every day | ORAL | Status: DC
  Administered 2022-03-17 – 2022-03-18 (×2): 1 via ORAL
  Filled 2022-03-17 (×2): qty 1

## 2022-03-17 NOTE — Progress Notes (Signed)
Initial Nutrition Assessment RD working remotely.  DOCUMENTATION CODES:   Not applicable  INTERVENTION:  - will order Boost Breeze TID, each supplement provides 250 kcal and 9 grams of protein. - will order 30 ml Prosource Plus BID, each supplement provides 100 kcal and 15 grams protein.  - will order 1 tablet multivitamin with minerals/day. - diet advancement as medically feasible. - complete NFPE at follow-up.   NUTRITION DIAGNOSIS:   Increased nutrient needs related to acute illness as evidenced by estimated needs.  GOAL:   Patient will meet greater than or equal to 90% of their needs  MONITOR:   PO intake, Supplement acceptance, Diet advancement, Labs, Weight trends  REASON FOR ASSESSMENT:   Malnutrition Screening Tool  ASSESSMENT:   61 y.o. female with medical history of HTN, HLD, GERD, insomnia, anemia, DDD, DM, and hiatal hernia s/p laparoscopic hiatal hernia repair and TIF on 02/27/22. She has been on a clear liquid diet since 03/13/22. Two days PTA she started having generalized abdominal pain, N/V, and fever of up to 101.5 degrees. She was admitted for pyelonephritis.  Diet advanced from NPO to CLD today at 0810. No meal intakes documented yet. She has not been seen by a [place] RD at any time in the past.   Weight yesterday was 156 lb and weight on 02/22/22 was 160 lb. This indicates 4 lb weight loss (2.5% body weight) in the past 1 month. No information documented in the edema section of flow sheet this hospitalization.  Per notes: - CT abdomen/pelvis showed mild urothelial enhancement of renal pelvis and ureter, pyelonephritis, sigmoid diverticulosis without evidence of diverticulitis, hepatic stenosis - sepsis 2/2 UTI - possible AKI - constipation   Labs reviewed; Cl: 113 mmol/l, Ca: 8.5 mg/dl. Medications reviewed; 5 mg IV reglan TID, 40 mg IV protonix BID. IVF; LR @ 100 ml/hr.    NUTRITION - FOCUSED PHYSICAL EXAM:  RD working remotely.  Diet  Order:   Diet Order             Diet clear liquid Room service appropriate? Yes; Fluid consistency: Thin  Diet effective now                   EDUCATION NEEDS:   No education needs have been identified at this time  Skin:  Skin Assessment: Reviewed RN Assessment  Last BM:  PTA/unknown  Height:   Ht Readings from Last 1 Encounters:  03/16/22 5' (1.524 m)    Weight:   Wt Readings from Last 1 Encounters:  03/16/22 70.9 kg     BMI:  Body mass index is 30.51 kg/m.  Estimated Nutritional Needs:  Kcal:  1800-2000 kcal Protein:  90-105 grams Fluid:  >/= 2 L/day      Jarome Matin, MS, RD, LDN Registered Dietitian II Inpatient Clinical Nutrition RD pager # and on-call/weekend pager # available in [facility]

## 2022-03-17 NOTE — Progress Notes (Signed)
PROGRESS NOTE    Martha [name]  NIO:[id number] DOB: 1961/02/17 DOA: 03/16/2022 PCP: Jeoffrey Massed, MD     Brief Narrative:   HTN, HLD, hiatal hernia s/p laparoscopic hiatal hernia repair and TIF on 5/23, has been on a clear liquid diet since. Presents with  generalized abdominal pain / N/V x 2days, started to have fever on day of presentation   Subjective:  Feeling better, last vomited yesterday evening, currently no n/v, ab pain has much improved, had solid bm x2 Desire to try clears   Husband at bedside   Assessment & Plan:  Principal Problem:   Pyelonephritis Active Problems:   Gastroesophageal reflux disease   Hiatal hernia   HTN (hypertension)   Sepsis (HCC)   UTI/pyelonephritis/sepsis -Present with fever, tachycardia , tachypnea , leukocytosis ,abdominal pain, nausea and vomiting -Improving continue hydration for another day, continue IV Rocephin 2 g daily, as needed Iv analgesic and  IV antiemetics -Advance diet as tolerated -Urine culture with E. Coli, blood culture no growth   AKI  Cr 1.21 on presentation,  Cr improved after treat UTI pyelonephritis and hydration  cr coming down to 0.8 today Repeat BMP in the morning    H/o hiatal hernia status post recent laparoscopic repair and TIF procedure on 5/25 -n/v has improved, advance diet as tolerated  -change to iv ppi  Non-insulin-dependent type 2 diabetes Was started on Ozempic, A1c down from 6.8 to 5.8 Hold Ozempic due to nausea and vomiting   HTN Initially treated with IV Lopressor Home med lisinopril held in the setting of elevated creatinine and nausea vomiting, likely able to resume tomorrow    Hyperlipidemia Hold statin for now, likely able to resume tomorrow .     The patient's BMI is: Body mass index is 30.51 kg/m.[name] Kitchen  Meet obesity criteria, report on Ozempic also for weight loss has been successful with 10 pounds weight loss recently. Nutritional Assessment: Seen by dietician.  I  agree with the assessment and plan as outlined below: Nutrition Status: Nutrition Problem: Increased nutrient needs Etiology: acute illness Signs/Symptoms: estimated needs Interventions: Boost Breeze, MVI, Prostat  .      I have Reviewed nursing notes, Vitals, pain scores, I/o's, Lab results and  imaging results since pt's last encounter, details please see discussion above  I ordered the following labs:  Unresulted Labs (From admission, onward)     Start     Ordered   03/23/22 0500  Creatinine, serum  (enoxaparin (LOVENOX)    CrCl >/= 30 ml/min)  Weekly,   R     Comments: while on enoxaparin therapy    03/16/22 1754   03/18/22 0500  CBC with Differential/Platelet  Tomorrow morning,   R        03/17/22 0939   03/18/22 0500  Basic metabolic panel  Tomorrow morning,   R        03/17/22 0939             DVT prophylaxis: enoxaparin (LOVENOX) injection 40 mg Start: 03/16/22 2000 SCDs Start: 03/16/22 1755   Code Status:   Code Status: Full Code  Family Communication: Husband at bedside Disposition:   Dispo: The patient is from: Home              Anticipated d/c is to: Home              Anticipated d/c date is: When urine culture finalized  Antimicrobials:   Anti-infectives (From admission, onward)  Start     Dose/Rate Route Frequency Ordered Stop   03/17/22 1600  cefTRIAXone (ROCEPHIN) 2 g in sodium chloride 0.9 % 100 mL IVPB        2 g 200 mL/hr over 30 Minutes Intravenous Every 24 hours 03/16/22 1753     03/16/22 1630  cefTRIAXone (ROCEPHIN) 1 g in sodium chloride 0.9 % 100 mL IVPB        1 g 200 mL/hr over 30 Minutes Intravenous  Once 03/16/22 1618 03/16/22 1721           Objective: Vitals:   03/17/22 0226 03/17/22 0606 03/17/22 1017 03/17/22 1352  BP: (!) 107/56 (!) 122/59 125/89 109/66  Pulse: 88 89 84 81  Resp: 20 18 14 14   Temp: (!) 97.5 F (36.4 C) 98.3 F (36.8 C) 98.6 F (37 C) 98.9 F (37.2 C)  TempSrc: Oral Oral Oral Oral  SpO2: 99%  100% 99% 100%  Weight:      Height:        Intake/Output Summary (Last 24 hours) at 03/17/2022 1728 Last data filed at 03/17/2022 1349 Gross per 24 hour  Intake 2474.34 ml  Output --  Net 2474.34 ml   Filed Weights   03/16/22 1836  Weight: 70.9 kg    Examination:  General exam: alert, awake, communicative,calm, NAD Respiratory system: Clear to auscultation. Respiratory effort normal. Cardiovascular system:  RRR.  Gastrointestinal system: Abdomen is nondistended, soft and nontender.  Normal bowel sounds heard. Central nervous system: Alert and oriented. No focal neurological deficits. Extremities:  no edema Skin: No rashes, lesions or ulcers Psychiatry: Judgement and insight appear normal. Mood & affect appropriate.     Data Reviewed: I have personally reviewed  labs and visualized  imaging studies since the last encounter and formulate the plan        Scheduled Meds:  (feeding supplement) PROSource Plus  30 mL Oral BID BM   enoxaparin (LOVENOX) injection  40 mg Subcutaneous Q24H   feeding supplement  1 Container Oral TID BM   multivitamin with minerals  1 tablet Oral Daily   pantoprazole (PROTONIX) IV  40 mg Intravenous Q12H   Continuous Infusions:  cefTRIAXone (ROCEPHIN)  IV 2 g (03/17/22 1535)   lactated ringers 100 mL/hr at 03/17/22 1349     LOS: 1 day     05/17/22, MD PhD FACP [facility]  Available via Epic secure chat 7am-7pm for nonurgent issues Please page for urgent issues To page the attending provider between 7A-7P or the covering provider during after hours 7P-7A, please log into the web site [email or web address] and access using universal [place] password for that web site. If you do not have the password, please call the hospital operator.    03/17/2022, 5:28 PM

## 2022-03-18 DIAGNOSIS — N12 Tubulo-interstitial nephritis, not specified as acute or chronic: Secondary | ICD-10-CM | POA: Diagnosis not present

## 2022-03-18 LAB — CBC WITH DIFFERENTIAL/PLATELET
Abs Immature Granulocytes: 0.05 10*3/uL (ref 0.00–0.07)
Basophils Absolute: 0.1 10*3/uL (ref 0.0–0.1)
Basophils Relative: 0 %
Eosinophils Absolute: 0.1 10*3/uL (ref 0.0–0.5)
Eosinophils Relative: 0 %
HCT: 30.3 % — ABNORMAL LOW (ref 36.0–46.0)
Hemoglobin: 9.6 g/dL — ABNORMAL LOW (ref 12.0–15.0)
Immature Granulocytes: 0 %
Lymphocytes Relative: 15 %
Lymphs Abs: 1.8 10*3/uL (ref 0.7–4.0)
MCH: 26.6 pg (ref 26.0–34.0)
MCHC: 31.7 g/dL (ref 30.0–36.0)
MCV: 83.9 fL (ref 80.0–100.0)
Monocytes Absolute: 1.3 10*3/uL — ABNORMAL HIGH (ref 0.1–1.0)
Monocytes Relative: 10 %
Neutro Abs: 9.3 10*3/uL — ABNORMAL HIGH (ref 1.7–7.7)
Neutrophils Relative %: 75 %
Platelets: 221 10*3/uL (ref 150–400)
RBC: 3.61 MIL/uL — ABNORMAL LOW (ref 3.87–5.11)
RDW: 15.2 % (ref 11.5–15.5)
WBC: 12.6 10*3/uL — ABNORMAL HIGH (ref 4.0–10.5)
nRBC: 0 % (ref 0.0–0.2)

## 2022-03-18 LAB — URINE CULTURE: Culture: >=100000 — AB

## 2022-03-18 LAB — BASIC METABOLIC PANEL
Anion gap: 9 (ref 5–15)
BUN: 8 mg/dL (ref 8–23)
CO2: 24 mmol/L (ref 22–32)
Calcium: 8.7 mg/dL — ABNORMAL LOW (ref 8.9–10.3)
Chloride: 109 mmol/L (ref 98–111)
Creatinine, Ser: 0.78 mg/dL (ref 0.44–1.00)
GFR, Estimated: >60 mL/min (ref >60)
Glucose, Bld: 103 mg/dL — ABNORMAL HIGH (ref 70–99)
Potassium: 3.5 mmol/L (ref 3.5–5.1)
Sodium: 142 mmol/L (ref 135–145)

## 2022-03-18 MED ORDER — AMOXICILLIN-POT CLAVULANATE 875-125 MG PO TABS: 1.0000 | ORAL_TABLET | Freq: Two times a day (BID) | ORAL | 0 refills | Status: AC

## 2022-03-18 MED ORDER — OZEMPIC (2 MG/DOSE) 8 MG/3ML ~~LOC~~ SOPN: 2.0000 mg | PEN_INJECTOR | SUBCUTANEOUS | Status: DC

## 2022-03-18 NOTE — Progress Notes (Signed)
Patient discharged home.  Discharge instructions explained, patient verbalizes understanding 

## 2022-03-18 NOTE — TOC CM/SW Note (Signed)
  Transition of Care [facility]) Screening Note   Patient Details  Name: Martha [name] Date of Birth: [birth year]   Transition of Care [facility]) CM/SW Contact:    Darleene Cleaver, LCSW Phone Number: 03/18/2022, 10:19 AM    Transition of Care Department [facility]) has reviewed patient and no TOC needs have been identified at this time. We will continue to monitor patient advancement through interdisciplinary progression rounds. If new patient transition needs arise, please place a TOC consult.

## 2022-03-18 NOTE — Discharge Summary (Signed)
Discharge Summary  Martha [name] YJE:[id number] DOB: [birth year]  PCP: Jeoffrey Massed, MD  Admit date: 03/16/2022 Discharge date: 03/18/2022  Time spent:   Recommendations for Outpatient Follow-up:  F/u with PCP within a week  for hospital discharge follow up, repeat cbc/bmp at follow up F/u with GI on 6/12, further diet advancement per GI  Pending Labs to follow up after discharge: Final blood culture result   Discharge Diagnoses:  Active Hospital Problems   Diagnosis Date Noted   Pyelonephritis 03/16/2022   Sepsis (HCC) 03/16/2022   HTN (hypertension) 01/04/2020   Gastroesophageal reflux disease    Hiatal hernia     Resolved Hospital Problems  No resolved problems to display.    Discharge Condition: stable  Diet recommendation:  full liquid diet   Filed Weights   03/16/22 1836  Weight: 70.9 kg    History of present illness:  Chief Complaint: n/v/ab pain/fever   HPI: Martha [name] is a 61 y.o. female with medical history significant of HTN, HLD, hiatal hernia s/p laparoscopic hiatal hernia repair and TIF on 5/23, has been on a clear liquid diet since. Two days ago she started having generalized abdominal pain and N/V. developed fever of 101.5 today prior to coming to the ED,  approximately two hours prior to arrival     ED Course:  Data reviewed: Blood pressure 120/72, pulse 91, temperature 98.6 F (37 C), temperature source Oral, resp. rate (!) 23, SpO2 97 %. WBC 19.3 Creatinine at admission 1.2 (1.12 on 02/22/2022,  0.9  on 11/14/2021) Unremarkable LFT and lipase Normal lactic acid UA with large leukocyte, many bacteria, negative nitrite   EKG: Independently reviewed.  Sinus rhythm, no acute ST-T changes, chronic RBBB   CXR ; no acute findings   Ct ab/pel with contrast: 1. Mild urothelial enhancement of the renal pelvis and ureter, small focal hypoenhancing area of the interpolar aspect, and right perinephric stranding as can be seen  with pyelonephritis. Correlate with urinalysis. 2. Otherwise, no acute abdominal or pelvic pathology. 3. Sigmoid diverticulosis without evidence of diverticulitis. 4. Hepatic steatosis. 5. Aortic Atherosclerosis   Blood culture and urine culture obtained in the ED, she received 1 dose of Rocephin and 1 L of normal saline bolus, hospitalist called to admit the patient    Hospital Course:  Principal Problem:   Pyelonephritis Active Problems:   Gastroesophageal reflux disease   Hiatal hernia   HTN (hypertension)   Sepsis (HCC)   UTI/pyelonephritis/sepsis -Present with fever, tachycardia , tachypnea , leukocytosis ,abdominal pain, nausea and vomiting -Urine culture with pan sensitive E. Coli, blood culture no growth so far -much improved on IV Rocephin 2 g daily, able to tolerating diet advancement, no vomiting, having bowel function --she desires to go home and f/u with GI on Monday -discussed with pharmacy , plan to discharge on  augmentin to finish  abx treatment   AKI , resolved  Cr 1.21 on presentation,  Cr normalized after treating UTI /pyelonephritis and hydration      H/o hiatal hernia status post recent laparoscopic repair and TIF procedure on 5/25 -continue ppi -f/u with GI on Monday   Non-insulin-dependent type 2 diabetes, controlled. Was started on Ozempic, A1c down from 6.8 to 5.8 Hold Ozempic due to nausea and vomiting   HTN Initially treated with IV Lopressor Home med lisinopril held in the hospital, resumed at discharge      Hyperlipidemia statin held in the hospital, resumed at discharge.  The patient's BMI is: Body mass index is 30.51 kg/m.[name] Kitchen  Meet obesity criteria, report on Ozempic also for weight loss has been successful with 10 pounds weight loss recently. Nutritional Assessment: Seen by dietician.  I agree with the assessment and plan as outlined below: Nutrition Status: Nutrition Problem: Increased nutrient needs Etiology: acute  illness Signs/Symptoms: estimated needs Interventions: Boost Breeze, MVI, Prostat   Discharge Exam: BP 138/66 (BP Location: Left Arm)   Pulse 85   Temp 98.5 F (36.9 C) (Oral)   Resp 18   Ht 5' (1.524 m)   Wt 70.9 kg   SpO2 97%   BMI 30.51 kg/m   General: NAD,  Cardiovascular: RRR Respiratory: normal respiratory effort     Discharge Instructions     Diet general   Complete by: As directed    Full liquid diet   Increase activity slowly   Complete by: As directed       Allergies as of 03/18/2022       Reactions   Codeine Anaphylaxis        Medication List     TAKE these medications    acetaminophen 500 MG tablet Commonly known as: TYLENOL Take 1,000 mg by mouth every 6 (six) hours as needed for mild pain, fever or headache.   acetaminophen 160 MG/5ML solution Commonly known as: TYLENOL Take 31.3 mLs (1,000 mg total) by mouth every 6 (six) hours as needed for mild pain, fever or headache.   amoxicillin-clavulanate 875-125 MG tablet Commonly known as: AUGMENTIN Take 1 tablet by mouth 2 (two) times daily for 11 days. Start taking on: March 19, 2022   atorvastatin 20 MG tablet Commonly known as: LIPITOR Take 1 tablet (20 mg total) by mouth daily.   cholecalciferol 25 MCG (1000 UNIT) tablet Commonly known as: VITAMIN D Take 1,000 Units by mouth daily.   gabapentin 300 MG capsule Commonly known as: NEURONTIN Take 300 mg by mouth every other day.   gabapentin 250 MG/5ML solution Commonly known as: NEURONTIN Take 6 mLs (300 mg total) by mouth every other day for 20 doses.   levocetirizine 5 MG tablet Commonly known as: XYZAL Take 5 mg by mouth daily.   lidocaine 2 % solution Commonly known as: XYLOCAINE Use as directed 5 mLs in the mouth or throat every 4 (four) hours as needed for mouth pain (esophageal pain, odynophagia).   lisinopril 10 MG tablet Commonly known as: ZESTRIL Take 1 tablet (10 mg total) by mouth daily.   meloxicam 15 MG  tablet Commonly known as: MOBIC Take 15 mg by mouth in the morning and at bedtime.   metoCLOPramide 10 MG tablet Commonly known as: REGLAN Take 1 tablet (10 mg total) by mouth every 8 (eight) hours as needed for nausea.   montelukast 10 MG tablet Commonly known as: SINGULAIR Take 1 tablet (10 mg total) by mouth at bedtime.   OMEGA-3 FISH OIL PO Take 1 capsule by mouth daily.   ondansetron 4 MG tablet Commonly known as: Zofran Take 1 tablet (4 mg total) by mouth every 6 (six) hours as needed for nausea or vomiting.   Ozempic (2 MG/DOSE) 8 MG/3ML Sopn Generic drug: Semaglutide (2 MG/DOSE) Inject 2 mg into the skin once a week. Hold this medication, please discuss with your doctor What changed: additional instructions   pantoprazole 40 MG tablet Commonly known as: PROTONIX Take 1 tablet (40 mg total) by mouth 2 (two) times daily.   PROBIOTIC PO Take 1 capsule by mouth  daily.   senna 8.6 MG Tabs tablet Commonly known as: SENOKOT Take 1 tablet by mouth at bedtime.   simethicone 80 MG chewable tablet Commonly known as: MYLICON Chew 1 tablet (80 mg total) by mouth every 6 (six) hours as needed for flatulence (gas, bloating, abdominal discomfort).   traMADol 50 MG tablet Commonly known as: ULTRAM Take 50 mg by mouth every 6 (six) hours as needed for moderate pain.       Allergies  Allergen Reactions   Codeine Anaphylaxis    Follow-up Information     McGowen, Maryjean MornPhilip H, MD Follow up in 1 week(s).   Specialty: Family Medicine Why: hospital discharge follow up, repeat cbc/bmp at follow up Contact information: [address] [place] [address] [phone]                  The results of significant diagnostics from this hospitalization (including imaging, microbiology, ancillary and laboratory) are listed below for reference.    Significant Diagnostic Studies: CT ABDOMEN PELVIS W CONTRAST  Result Date: 03/16/2022 CLINICAL DATA:  Abdominal pain,  postop nausea and vomiting EXAM: CT ABDOMEN AND PELVIS WITH CONTRAST TECHNIQUE: Multidetector CT imaging of the abdomen and pelvis was performed using the standard protocol following bolus administration of intravenous contrast. RADIATION DOSE REDUCTION: This exam was performed according to the departmental dose-optimization program which includes automated exposure control, adjustment of the mA and/or kV according to patient size and/or use of iterative reconstruction technique. CONTRAST:  100mL OMNIPAQUE IOHEXOL 300 MG/ML  SOLN COMPARISON:  None Available. FINDINGS: Lower chest: Bibasilar atelectasis. Hepatobiliary: No focal liver abnormality is seen. Low-attenuation of the liver as can be seen with hepatic steatosis. Prior cholecystectomy. Pancreas: Unremarkable. No pancreatic ductal dilatation or surrounding inflammatory changes. Spleen: Normal in size without focal abnormality. Adrenals/Urinary Tract: Adrenal glands are unremarkable. Punctate nonobstructing right renal calculus. No obstructive uropathy. Mild urothelial enhancement of the renal pelvis and ureter, small focal hypoenhancing area of the interpolar aspect, and right perinephric stranding as can be seen with pyelonephritis. Correlate with urinalysis. Bladder is unremarkable. Stomach/Bowel: Stomach is within normal limits. No hiatal hernia. No evidence of bowel wall thickening, distention, or inflammatory changes. Appendix is normal. Sigmoid diverticulosis without evidence of diverticulitis. Vascular/Lymphatic: Aortic atherosclerosis. No enlarged abdominal or pelvic lymph nodes. Reproductive: Status post hysterectomy. No adnexal masses. Other: No abdominal wall hernia or abnormality. Trace pelvic free fluid. Musculoskeletal: No acute osseous abnormality. No aggressive osseous lesion. Degenerative disease with disc height loss L5-S1 with bilateral foraminal stenosis. IMPRESSION: 1. Mild urothelial enhancement of the renal pelvis and ureter, small focal  hypoenhancing area of the interpolar aspect, and right perinephric stranding as can be seen with pyelonephritis. Correlate with urinalysis. 2. Otherwise, no acute abdominal or pelvic pathology. 3. Sigmoid diverticulosis without evidence of diverticulitis. 4. Hepatic steatosis. 5. Aortic Atherosclerosis (ICD10-I70.0). Electronically Signed   By: Elige KoHetal  Patel M.D.   On: 03/16/2022 15:42   DG Chest Portable 1 View  Result Date: 03/16/2022 CLINICAL DATA:  Fever, epigastric pain, recent surgery EXAM: PORTABLE CHEST 1 VIEW COMPARISON:  None Available. FINDINGS: The heart size and mediastinal contours are within normal limits. Both lungs are clear. The visualized skeletal structures are unremarkable. IMPRESSION: No acute abnormality of the lungs in AP portable projection. Electronically Signed   By: Jearld LeschAlex D Bibbey M.D.   On: 03/16/2022 13:20    Microbiology: Recent Results (from the past 240 hour(s))  Urine Culture     Status: Abnormal   Collection  Time: 03/16/22 12:27 PM   Specimen: Urine, Clean Catch  Result Value Ref Range Status   Specimen Description   Final    URINE, CLEAN CATCH Performed at [facility], [address], [place], [place] [address]    Special Requests   Final    NONE Performed at [facility], [address], [place], [place] [address]    Culture >=100,000 COLONIES/mL ESCHERICHIA COLI (A)  Final   Report Status 03/18/2022 FINAL  Final   Organism ID, Bacteria ESCHERICHIA COLI (A)  Final      Susceptibility   Escherichia coli - MIC*    AMPICILLIN 4 SENSITIVE Sensitive     CEFAZOLIN <=4 SENSITIVE Sensitive     CEFEPIME <=0.12 SENSITIVE Sensitive     CEFTRIAXONE <=0.25 SENSITIVE Sensitive     CIPROFLOXACIN <=0.25 SENSITIVE Sensitive     GENTAMICIN <=1 SENSITIVE Sensitive     IMIPENEM <=0.25 SENSITIVE Sensitive     NITROFURANTOIN <=16 SENSITIVE Sensitive     TRIMETH/SULFA <=20 SENSITIVE Sensitive     AMPICILLIN/SULBACTAM <=2 SENSITIVE  Sensitive     PIP/TAZO <=4 SENSITIVE Sensitive     * >=100,000 COLONIES/mL ESCHERICHIA COLI  Blood culture (routine x 2)     Status: None (Preliminary result)   Collection Time: 03/16/22 12:28 PM   Specimen: BLOOD RIGHT FOREARM  Result Value Ref Range Status   Specimen Description   Final    BLOOD RIGHT FOREARM Performed at [facility], [address] [place], [place] [address]    Special Requests   Final    BOTTLES DRAWN AEROBIC AND ANAEROBIC Blood Culture results may not be optimal due to an inadequate volume of blood received in culture bottles Performed at [facility], [address], [place], [place] [address]    Culture   Final    NO GROWTH < 24 HOURS Performed at [facility], [address], [place], [place] [address]    Report Status PENDING  Incomplete  Blood culture (routine x 2)     Status: None (Preliminary result)   Collection Time: 03/16/22  6:52 PM   Specimen: BLOOD  Result Value Ref Range Status   Specimen Description   Final    BLOOD LEFT ANTECUBITAL Performed at [facility], [address], [place], [place] [address]    Special Requests   Final    BOTTLES DRAWN AEROBIC ONLY Blood Culture adequate volume Performed at [facility], [address] [place], [place] [address]    Culture   Final    NO GROWTH < 12 HOURS Performed at [facility], [address], [place], [place] [address]    Report Status PENDING  Incomplete     Labs: Basic Metabolic Panel: Recent Labs  Lab 03/16/22 1225 03/17/22 0538 03/18/22 0534  NA 139 143 142  K 4.1 4.1 3.5  CL 107 113* 109  CO2 GLUCOSE 116* 89 103*  BUN CREATININE 1.21* 0.83 0.78  CALCIUM 9.3 8.5* 8.7*  MG  --  2.0  --    Liver Function Tests: Recent Labs  Lab 03/16/22 1225  AST 17  ALT 20  ALKPHOS 79  BILITOT 0.7  PROT 7.8  ALBUMIN 4.0   Recent Labs  Lab 03/16/22 1225  LIPASE 31   No  results for input(s): "AMMONIA" in the last 168 hours. CBC: Recent Labs  Lab 03/16/22 1225  03/17/22 0538 03/18/22 0534  WBC 19.3* 18.7* 12.6*  NEUTROABS 15.5* 14.7* 9.3*  HGB 12.3 9.8* 9.6*  HCT 38.8 31.4* 30.3*  MCV 84.3 84.2 83.9  PLT 269 214 221   Cardiac Enzymes: No results for input(s): "CKTOTAL", "CKMB", "CKMBINDEX", "TROPONINI" in the last 168 hours. BNP: BNP (last 3 results) No results for input(s): "BNP" in the last 8760 hours.  ProBNP (last 3 results) No results for input(s): "PROBNP" in the last 8760 hours.  CBG: Recent Labs  Lab 03/16/22 1650  GLUCAP 94    FURTHER DISCHARGE INSTRUCTIONS:   Get Medicines reviewed and adjusted: Please take all your medications with you for your next visit with your Primary MD   Laboratory/radiological data: Please request your Primary MD to go over all hospital tests and procedure/radiological results at the follow up, please ask your Primary MD to get all Hospital records sent to his/her office.   In some cases, they will be blood work, cultures and biopsy results pending at the time of your discharge. Please request that your primary care M.D. goes through all the records of your hospital data and follows up on these results.   Also Note the following: If you experience worsening of your admission symptoms, develop shortness of breath, life threatening emergency, suicidal or homicidal thoughts you must seek medical attention immediately by calling 911 or calling your MD immediately  if symptoms less severe.   You must read complete instructions/literature along with all the possible adverse reactions/side effects for all the Medicines you take and that have been prescribed to you. Take any new Medicines after you have completely understood and accpet all the possible adverse reactions/side effects.    Do not drive when taking Pain medications or sleeping medications (Benzodaizepines)   Do not take more than prescribed Pain,  Sleep and Anxiety Medications. It is not advisable to combine anxiety,sleep and pain medications without talking with your primary care practitioner   Special Instructions: If you have smoked or chewed Tobacco  in the last 2 yrs please stop smoking, stop any regular Alcohol  and or any Recreational drug use.   Wear Seat belts while driving.   Please note: You were cared for by a hospitalist during your hospital stay. Once you are discharged, your primary care physician will handle any further medical issues. Please note that NO REFILLS for any discharge medications will be authorized once you are discharged, as it is imperative that you return to your primary care physician (or establish a relationship with a primary care physician if you do not have one) for your post hospital discharge needs so that they can reassess your need for medications and monitor your lab values.     Signed:  Albertine Grates MD, PhD, FACP  [facility] 03/18/2022, 11:30 AM

## 2022-03-19 ENCOUNTER — Ambulatory Visit (INDEPENDENT_AMBULATORY_CARE_PROVIDER_SITE_OTHER): Payer: 59 | Admitting: Gastroenterology

## 2022-03-19 ENCOUNTER — Telehealth: Payer: Self-pay

## 2022-03-19 ENCOUNTER — Encounter: Payer: Self-pay | Admitting: Gastroenterology

## 2022-03-19 VITALS — BP 148/82 | HR 79 | Ht 60.0 in | Wt 159.4 lb

## 2022-03-19 DIAGNOSIS — Z9889 Other specified postprocedural states: Secondary | ICD-10-CM

## 2022-03-19 DIAGNOSIS — K219 Gastro-esophageal reflux disease without esophagitis: Secondary | ICD-10-CM

## 2022-03-19 DIAGNOSIS — R110 Nausea: Secondary | ICD-10-CM

## 2022-03-19 DIAGNOSIS — E119 Type 2 diabetes mellitus without complications: Secondary | ICD-10-CM

## 2022-03-19 MED ORDER — ONDANSETRON 4 MG PO TBDP: 4.0000 mg | ORAL_TABLET | Freq: Four times a day (QID) | ORAL | 3 refills | Status: DC | PRN

## 2022-03-19 NOTE — Telephone Encounter (Signed)
Transition Care Management Unsuccessful Follow-up Telephone Call  Date of discharge and from where:  03/18/22 [place]   Attempts:  1st Attempt  Reason for unsuccessful TCM follow-up call:  Left voice message

## 2022-03-19 NOTE — Telephone Encounter (Signed)
Thank you for taking care of her in my absence.  Patient was admitted over the weekend with UTI sepsis and discharged to home.  Has follow-up appointment with me today.

## 2022-03-19 NOTE — Telephone Encounter (Signed)
Noted: nurse phone contact with patient for TCM. Signed:  Crissie Sickles, MD           03/19/2022

## 2022-03-19 NOTE — Progress Notes (Signed)
Chief Complaint:    Postoperative follow-up, hospital follow-up  GI History: Martha [name] is a 61 y.o. female with a history of diabetes, arthralgias, HTN, HLD, spinal stenosis (which limits exercise), with a longstanding history of GERD as outlined below, now s/p concomitant laparoscopic hiatal hernia repair and Transoral Incisionless Fundoplication (cTIF) on 0000000.  GERD history: -Index symptoms: Heartburn, regurgitation, nocturnal cough/choking -Exacerbating features: coffee,eating close to bedtime, tomato based sauces, red wine -Medications trialed: Pantoprazole, famotidine, Carafate, esomeprazole, omeprazole -Current medications: Protonix 40 mg bid since surgery -Complications: hiatal hernia, erosive esophagitis   GERD evaluation: -Last EGD: 06/2019 -Barium esophagram: None -Esophageal Manometry: 07/2019: 2.4 cm hiatal hernia, Normal motility -pH/Impedance: 07/2019 (on PPI): pH <4 in 8% of time, [place] 16.3.  Elevated reflux episodes in supine position.  Negative symptom correlation by SAP -Bravo: N/A   Endoscopic History: - 06/26/2019: EGD: LA Grade A esophagitis, biopsies negative for EOE, small HH - 02/27/2022: Laparoscopic hiatal hernia repair with concomitant TIF.  24 Serofuse fasteners placed with 300 degree valve measuring 3 cm in length  HPI:     Patient is a 62 y.o. female presenting to the [facility] for follow-up after cTIF on 02/27/2022.  She has been doing well since surgery, tolerating stage II liquid diet without issue.  Was taking Protonix 40 mg bid as prescribed. Had no breakthough reflux sxs since surgery.   Then developed acute onset nausea/vomiting and fever on 03/16/2022. She was hospitalized 6/9-11 with UTI sepsis, pyelonephritis, and AKI.  Urine culture with pansensitive E. coli.  Blood cultures negative.  Treated with IV Rocephin with rapid improvement and discharged home with Augmentin x11 days.  Ozempic held due to  nausea/vomiting.  Nausea/vomiting resolved quickly in the hospital after starting IV ABX.  And recommended continuing Reglan and Zofran prn.  Holding Ozempic until full recovery. She will call Dr. Anitra Lauth today to discuss DM medications while holding Ozempic.   Has appt with Dr. Redmond Pulling later this month.   Review of systems:     No chest pain, no SOB, no fevers, no urinary sx   Past Medical History:  Diagnosis Date   Allergic rhinitis    Anemia    Arthralgia of multiple sites    Gen rheum lab w/u normal/neg 11/2016 by prior PCP   Constipation    DDD (degenerative disc disease), lumbar 11/2020   R glut/hip pain->ortho, MRI->disc causing pinched nerve at L3-4 level on R, signif dz on L at L4-5 and L5-S1--->ESI on R 11/2020 very helpful, +PT.   Diabetes mellitus ([place]) 2022   Prediab 2017 --Old PCP records state only that her Hba1c was 6.1% in 2017: she took metformin x ? 6-12 mo, rx'd by wt loss MD.  A1c 6.1% Nov 2019. A1c 6.2% 12/2019. A1c 6.8% 12/2020.   GERD (gastroesophageal reflux disease)    +grade A esophagitis 06/2019 EGD.  Bx neg for eosinophilic esoph. [id number] esoph manometry normal, pH probe + abnl reflux not fully suppressed by PPI. PPI BID + H2 blocker as of 08/2019.   History of rectal bleeding    remote past->? rectal ulcer vs perf'd divertic-->need old records for clarif.   Hypercholesterolemia    Past hx of statin use, then was able to come off meds when she lost wt   Hypertension    Insomnia    Joint pain    Obesity, Class I, BMI 30-34.9    Palpitations    Right hip pain    MRI showed labral tear, got  steroid injection. Also troch bursitis.   Right shoulder pain 05/2020   RC tear, bicipital pathology, AC arthrosis and impingement-->to have surgery    Patient's surgical history, family medical history, social history, medications and allergies were all reviewed in Epic    Current Outpatient Medications  Medication Sig Dispense Refill   acetaminophen (TYLENOL) 160  MG/5ML solution Take 31.3 mLs (1,000 mg total) by mouth every 6 (six) hours as needed for mild pain, fever or headache. 120 mL 0   amoxicillin-clavulanate (AUGMENTIN) 875-125 MG tablet Take 1 tablet by mouth 2 (two) times daily for 11 days. 22 tablet 0   atorvastatin (LIPITOR) 20 MG tablet Take 1 tablet (20 mg total) by mouth daily. 90 tablet 1   gabapentin (NEURONTIN) 250 MG/5ML solution Take 6 mLs (300 mg total) by mouth every other day for 20 doses. 60 mL 1   levocetirizine (XYZAL) 5 MG tablet Take 5 mg by mouth daily.      lidocaine (XYLOCAINE) 2 % solution Use as directed 5 mLs in the mouth or throat every 4 (four) hours as needed for mouth pain (esophageal pain, odynophagia). 60 mL 1   lisinopril (ZESTRIL) 10 MG tablet Take 1 tablet (10 mg total) by mouth daily. 90 tablet 3   meloxicam (MOBIC) 15 MG tablet Take 15 mg by mouth in the morning and at bedtime.     montelukast (SINGULAIR) 10 MG tablet Take 1 tablet (10 mg total) by mouth at bedtime. 90 tablet 3   ondansetron (ZOFRAN) 4 MG tablet Take 1 tablet (4 mg total) by mouth every 6 (six) hours as needed for nausea or vomiting. 30 tablet 1   OZEMPIC, 2 MG/DOSE, 8 MG/3ML SOPN Inject 2 mg into the skin once a week. Hold this medication, please discuss with your doctor     pantoprazole (PROTONIX) 40 MG tablet Take 1 tablet (40 mg total) by mouth 2 (two) times daily. 60 tablet 1   senna (SENOKOT) 8.6 MG TABS tablet Take 1 tablet by mouth at bedtime.     simethicone (MYLICON) 80 MG chewable tablet Chew 1 tablet (80 mg total) by mouth every 6 (six) hours as needed for flatulence (gas, bloating, abdominal discomfort). 30 tablet 0   traMADol (ULTRAM) 50 MG tablet Take 50 mg by mouth every 6 (six) hours as needed for moderate pain.     acetaminophen (TYLENOL) 500 MG tablet Take 1,000 mg by mouth every 6 (six) hours as needed for mild pain, fever or headache. (Patient not taking: Reported on 03/19/2022)     cholecalciferol (VITAMIN D) 25 MCG (1000 UT)  tablet Take 1,000 Units by mouth daily. (Patient not taking: Reported on 03/19/2022)     gabapentin (NEURONTIN) 300 MG capsule Take 300 mg by mouth every other day. (Patient not taking: Reported on 03/19/2022)     Omega-3 Fatty Acids (OMEGA-3 FISH OIL PO) Take 1 capsule by mouth daily.  (Patient not taking: Reported on 03/19/2022)     No current facility-administered medications for this visit.    Physical Exam:     Ht 5' (1.524 m)   Wt 159 lb 6 oz (72.3 kg)   BMI 31.13 kg/m   GENERAL:  Pleasant female in NAD PSYCH: : Cooperative, normal affect Musculoskeletal:  Normal muscle tone, normal strength NEURO: Alert and oriented x 3, no focal neurologic deficits   IMPRESSION and PLAN:    1) GERD with history of erosive esophagitis 2) Hiatal hernia s/p hiatal hernia repair and fundoplication 61 year old female  with long history of GERD c/b erosive esophagitis and hiatal hernia, now s/p concomitant laparoscopic hiatal hernia repair and TIF on 02/27/2022.  Had resolution of reflux symptoms postoperatively.  Unfortunately with recent nausea/vomiting due to UTI with sepsis and pyelonephritis.  Hopefully no suture disruption with her recent nausea/vomiting.  Does not report any breakthrough reflux symptoms in the last couple of days.  - Continue Protonix 40 mg bid for another week, and if still no breakthrough reflux symptoms, start titration to 40 mg daily x2 weeks, then QOD x1 week, then prn only - Start advancing diet slowly per postoperative protocol - Continue slowly advancing activity/exercise per postoperative protocol - Keep follow-up appointment w/ Dr. Redmond Pulling later this month - RTC in 3 months or sooner prn  3) Nausea 4) Recent UTI with sepsis and pyelonephritis - Provided with Rx for Zofran 4 mg ODT if any recurrent nausea - Complete ABX course as prescribed - Holding Ozempic for the time being  5) Diabetes - As above, holding Ozempic - To follow-up with her PCM to discuss  medication management options while holding Ozempic  I spent 32 minutes of time, including in depth chart review, independent review of results as outlined above, communicating results with the patient directly, face-to-face time with the patient, coordinating care, and ordering studies and medications as appropriate, and documentation.   [place] ,DO, FACG 03/19/2022, 9:03 AM

## 2022-03-19 NOTE — Patient Instructions (Addendum)
If you are age 63 or younger, your body mass index should be between 19-25. Your There is no height or weight on file to calculate BMI. If this is out of the aformentioned range listed, please consider follow up with your Primary Care Provider.   __________________________________________________________  The [facility] providers would like to encourage you to use [facility] to communicate with providers for non-urgent requests or questions.  Due to long hold times on the telephone, sending your provider a message by [facility] may be a faster and more efficient way to get a response.  Please allow 48 business hours for a response.  Please remember that this is for non-urgent requests.   Due to recent changes in healthcare laws, you may see the results of your imaging and laboratory studies on MyChart before your provider has had a chance to review them.  We understand that in some cases there may be results that are confusing or concerning to you. Not all laboratory results come back in the same time frame and the provider may be waiting for multiple results in order to interpret others.  Please give [place] 48 hours in order for your provider to thoroughly review all the results before contacting the office for clarification of your results.   We have sent the following medications to your pharmacy for you to pick up at your convenience: Zofran  Please follow up in 3 months. Give [place] a call at [phone] to schedule an appointment.   Thank you for choosing me and  Gastroenterology.  Vito Cirigliano, D.O.

## 2022-03-21 ENCOUNTER — Encounter: Payer: Self-pay | Admitting: Family Medicine

## 2022-03-21 ENCOUNTER — Telehealth: Payer: Self-pay

## 2022-03-21 ENCOUNTER — Ambulatory Visit (INDEPENDENT_AMBULATORY_CARE_PROVIDER_SITE_OTHER): Payer: 59 | Admitting: Family Medicine

## 2022-03-21 VITALS — BP 135/71 | HR 72 | Temp 98.2°F | Ht 60.0 in | Wt 155.6 lb

## 2022-03-21 DIAGNOSIS — N179 Acute kidney failure, unspecified: Secondary | ICD-10-CM

## 2022-03-21 DIAGNOSIS — Z87448 Personal history of other diseases of urinary system: Secondary | ICD-10-CM | POA: Diagnosis not present

## 2022-03-21 DIAGNOSIS — A4151 Sepsis due to Escherichia coli [E. coli]: Secondary | ICD-10-CM

## 2022-03-21 DIAGNOSIS — N12 Tubulo-interstitial nephritis, not specified as acute or chronic: Secondary | ICD-10-CM | POA: Diagnosis not present

## 2022-03-21 DIAGNOSIS — E119 Type 2 diabetes mellitus without complications: Secondary | ICD-10-CM | POA: Diagnosis not present

## 2022-03-21 LAB — CBC WITH DIFFERENTIAL/PLATELET
Basophils Absolute: 0.1 10*3/uL (ref 0.0–0.1)
Basophils Relative: 0.9 % (ref 0.0–3.0)
Eosinophils Absolute: 0.2 10*3/uL (ref 0.0–0.7)
Eosinophils Relative: 2.9 % (ref 0.0–5.0)
HCT: 33.8 % — ABNORMAL LOW (ref 36.0–46.0)
Hemoglobin: 10.7 g/dL — ABNORMAL LOW (ref 12.0–15.0)
Lymphocytes Relative: 32.3 % (ref 12.0–46.0)
Lymphs Abs: 2.1 10*3/uL (ref 0.7–4.0)
MCHC: 31.8 g/dL (ref 30.0–36.0)
MCV: 83.2 fl (ref 78.0–100.0)
Monocytes Absolute: 0.6 10*3/uL (ref 0.1–1.0)
Monocytes Relative: 8.5 % (ref 3.0–12.0)
Neutro Abs: 3.7 10*3/uL (ref 1.4–7.7)
Neutrophils Relative %: 55.4 % (ref 43.0–77.0)
Platelets: 376 10*3/uL (ref 150.0–400.0)
RBC: 4.06 Mil/uL (ref 3.87–5.11)
RDW: 15.5 % (ref 11.5–15.5)
WBC: 6.6 10*3/uL (ref 4.0–10.5)

## 2022-03-21 LAB — BASIC METABOLIC PANEL
BUN: 8 mg/dL (ref 6–23)
CO2: 29 mEq/L (ref 19–32)
Calcium: 9.5 mg/dL (ref 8.4–10.5)
Chloride: 102 mEq/L (ref 96–112)
Creatinine, Ser: 0.82 mg/dL (ref 0.40–1.20)
GFR: 76.47 mL/min (ref >60.00)
Glucose, Bld: 83 mg/dL (ref 70–99)
Potassium: 3.8 mEq/L (ref 3.5–5.1)
Sodium: 140 mEq/L (ref 135–145)

## 2022-03-21 LAB — CULTURE, BLOOD (ROUTINE X 2)
Culture: NO GROWTH
Culture: NO GROWTH
Special Requests: ADEQUATE

## 2022-03-21 NOTE — Telephone Encounter (Signed)
Transition Care Management Unsuccessful Follow-up Telephone Call  Date of discharge and from where:  03/18/22 [place] [facility]   Attempts:  3rd Attempt  Reason for unsuccessful TCM follow-up call:  Left voice message

## 2022-03-21 NOTE — Progress Notes (Signed)
03/21/2022  CC: TCM hosp f/u  Patient is a 61 y.o. Caucasian female who presents for  hospital follow up, specifically Transitional Care Services face-to-face visit. Dates hospitalized: 03/16/2022 to 03/18/2022. Days since d/c from hospital: 3 days Patient was discharged from hospital to home Reason for admission to hospital: Pyelonephritis Date of interactive (phone) contact with patient and/or caregiver: 03/19/2022.  I have reviewed patient's discharge summary plus pertinent specific notes, labs, and imaging from the hospitalization.   Gigi got laparoscopic hiatal hernia repair and TIF on 02/27/2022. A couple days prior to her emergency department presentation she developed nausea, vomiting, and abdominal pain and fever. Urine grew pansensitive E. coli.  Blood cultures negative to date.  Rocephin given.  Discharged home on Augmentin. She did have mild acute kidney injury, serum creatinine 1.21 on presentation.  This came down to 2.78 on the day of discharge.  Currently: She is feeling better, energy is coming back.  She just started advancing her diet to soft/pured food yesterday. Since going home she has had no fever or chills and no nausea or vomiting. She is taking the Augmentin without problem. She states she never had any urinary symptoms other than seeing her urine turn a little bit cloudy.  This has resolved.  Medication reconciliation was done today and patient is taking meds as recommended by discharging hospitalist/specialist.    PMH:  Past Medical History:  Diagnosis Date   Allergic rhinitis    Anemia    Arthralgia of multiple sites    Gen rheum lab w/u normal/neg 11/2016 by prior PCP   Constipation    DDD (degenerative disc disease), lumbar 11/2020   R glut/hip pain->ortho, MRI->disc causing pinched nerve at L3-4 level on R, signif dz on L at L4-5 and L5-S1--->ESI on R 11/2020 very helpful, +PT.   Diabetes mellitus ([place]) 2022   Prediab 2017 --Old PCP records state only  that her Hba1c was 6.1% in 2017: she took metformin x ? 6-12 mo, rx'd by wt loss MD.  A1c 6.1% Nov 2019. A1c 6.2% 12/2019. A1c 6.8% 12/2020.   Diverticulosis    Fatty liver    Noted on CT done during her hospitalization for pyelo 03/2022   GERD (gastroesophageal reflux disease)    +grade A esophagitis 06/2019 EGD.  Bx neg for eosinophilic esoph. 74/1638 esoph manometry normal, pH probe + abnl reflux not fully suppressed by PPI. PPI BID + H2 blocker as of 08/2019.   History of rectal bleeding    remote past->? rectal ulcer vs perf'd divertic-->need old records for clarif.   Hypercholesterolemia    Past hx of statin use, then was able to come off meds when she lost wt   Hypertension    Insomnia    Obesity, Class I, BMI 30-34.9    Palpitations    Pyelonephritis    hosp 03/2022   Right hip pain    MRI showed labral tear, got steroid injection. Also troch bursitis.   Right shoulder pain 05/2020   RC tear, bicipital pathology, AC arthrosis and impingement-->to have surgery    PSH:  Past Surgical History:  Procedure Laterality Date   2 HOUR [place] STUDY N/A 08/05/2019   +GERD. Procedure: [facility] STUDY;  Surgeon: Thornton Park, MD;  Location: [facility];  Service: Gastroenterology;  Laterality: N/A;   ABDOMINAL HYSTERECTOMY  2005   for benign dx (No BSO)   BREAST BIOPSY Right 2005   CESAREAN SECTION     x 2   CHOLECYSTECTOMY  2011   COLONOSCOPY  most recent 01/2012   x 2->approx 2011 was done for BRBPR, unclear dx (rectal ulcer? perf'd diverticulum?).  Another about 2015 for abd pain w/u?  No polyps detected on either colonoscopy.  Need old records for clarification (GI MD was Dr. Candiss [place] in [place])--prior pcp records say "colonoscopy 01/2012 negative".   ESOPHAGEAL MANOMETRY N/A 08/05/2019   NORMAL Procedure: ESOPHAGEAL MANOMETRY (EM);  Surgeon: Thornton Park, MD;  Location: [facility];  Service: Gastroenterology;  Laterality: N/A;   ESOPHAGOGASTRODUODENOSCOPY  06/2019      06/2019->reflux esophagitis, o/w normal.   ESOPHAGOGASTRODUODENOSCOPY N/A 02/27/2022   Procedure: ESOPHAGOGASTRODUODENOSCOPY (EGD);  Surgeon: Lavena Bullion, DO;  Location: [facility];  Service: Gastroenterology;  Laterality: N/A;   HIATAL HERNIA REPAIR N/A 02/27/2022   Procedure: LAPAROSCOPIC REPAIR OF HIATAL HERNIA;  Surgeon: Greer Pickerel, MD;  Location: [facility];  Service: General;  Laterality: N/A;   ROTATOR CUFF REPAIR Right 06/2021   TONSILLECTOMY AND ADENOIDECTOMY  2009   TRANSORAL INCISIONLESS FUNDOPLICATION N/A 03/14/3015   Procedure: TRANSORAL INCISIONLESS FUNDOPLICATION;  Surgeon: Lavena Bullion, DO;  Location: [facility];  Service: Gastroenterology;  Laterality: N/A;   ROS as above, plus--> no CP, no SOB, no wheezing, no cough, no dizziness, no HAs, no rashes, no melena/hematochezia.  No polyuria or polydipsia.  No myalgias or arthralgias.  No focal weakness, paresthesias, or tremors.  No acute vision or hearing abnormalities.  No dysuria or unusual/new urinary urgency or frequency.  No recent changes in lower legs. No n/v/d or abd pain.  No palpitations.    MEDS:  Outpatient Medications Prior to Visit  Medication Sig Dispense Refill   acetaminophen (TYLENOL) 160 MG/5ML solution Take 31.3 mLs (1,000 mg total) by mouth every 6 (six) hours as needed for mild pain, fever or headache. 120 mL 0   amoxicillin-clavulanate (AUGMENTIN) 875-125 MG tablet Take 1 tablet by mouth 2 (two) times daily for 11 days. 22 tablet 0   atorvastatin (LIPITOR) 20 MG tablet Take 1 tablet (20 mg total) by mouth daily. 90 tablet 1   gabapentin (NEURONTIN) 250 MG/5ML solution Take 6 mLs (300 mg total) by mouth every other day for 20 doses. 60 mL 1   levocetirizine (XYZAL) 5 MG tablet Take 5 mg by mouth daily.      lidocaine (XYLOCAINE) 2 % solution Use as directed 5 mLs in the mouth or throat every 4 (four) hours as needed for mouth pain (esophageal pain, odynophagia). 60 mL 1   lisinopril (ZESTRIL) 10 MG tablet Take  1 tablet (10 mg total) by mouth daily. 90 tablet 3   meloxicam (MOBIC) 15 MG tablet Take 15 mg by mouth in the morning and at bedtime.     montelukast (SINGULAIR) 10 MG tablet Take 1 tablet (10 mg total) by mouth at bedtime. 90 tablet 3   ondansetron (ZOFRAN-ODT) 4 MG disintegrating tablet Take 1 tablet (4 mg total) by mouth every 6 (six) hours as needed for nausea or vomiting. 30 tablet 3   pantoprazole (PROTONIX) 40 MG tablet Take 1 tablet (40 mg total) by mouth 2 (two) times daily. 60 tablet 1   senna (SENOKOT) 8.6 MG TABS tablet Take 1 tablet by mouth at bedtime.     simethicone (MYLICON) 80 MG chewable tablet Chew 1 tablet (80 mg total) by mouth every 6 (six) hours as needed for flatulence (gas, bloating, abdominal discomfort). 30 tablet 0   traMADol (ULTRAM) 50 MG tablet Take 50 mg by mouth every 6 (six)  hours as needed for moderate pain.     acetaminophen (TYLENOL) 500 MG tablet Take 1,000 mg by mouth every 6 (six) hours as needed for mild pain, fever or headache. (Patient not taking: Reported on 03/19/2022)     cholecalciferol (VITAMIN D) 25 MCG (1000 UT) tablet Take 1,000 Units by mouth daily. (Patient not taking: Reported on 03/19/2022)     gabapentin (NEURONTIN) 300 MG capsule Take 300 mg by mouth every other day. (Patient not taking: Reported on 03/19/2022)     Omega-3 Fatty Acids (OMEGA-3 FISH OIL PO) Take 1 capsule by mouth daily.  (Patient not taking: Reported on 03/19/2022)     OZEMPIC, 2 MG/DOSE, 8 MG/3ML SOPN Inject 2 mg into the skin once a week. Hold this medication, please discuss with your doctor (Patient not taking: Reported on 03/21/2022)     NEOMYCIN-BACITRACIN-POLYMYXIN EX 1 APPLICATION TO RIGHT EYE 3 TIMES DAILY FOR 7 DAYS (Patient not taking: Reported on 03/21/2022)     No facility-administered medications prior to visit.    Physical Exam    03/21/2022   11:18 AM 03/21/2022   11:15 AM 03/19/2022    8:56 AM  Vitals with BMI  Height  _0  _1   Weight  155 lbs 10 oz 159  lbs 6 oz  BMI  41.28 78.67  Systolic 672 094 709  Diastolic 71 83 82  Pulse  72 79   EXAM: General: Alert and well-appearing. Affect is pleasant, lucid thought and speech. GGE:ZMOQ: no injection, icteris, swelling, or exudate.  EOMI, PERRLA. Mouth: lips without lesion/swelling.  Oral mucosa pink and moist. Oropharynx without erythema, exudate, or swelling.  CV: RRR, no m/r/g.   LUNGS: CTA bilat, nonlabored resps, good aeration in all lung fields. ABD: soft, NT/ND BACK: no cva tenderness.  No flank tenderness. Extremities: No edema   Pertinent labs/imaging Last CBC Lab Results  Component Value Date   WBC 12.6 (H) 03/18/2022   HGB 9.6 (L) 03/18/2022   HCT 30.3 (L) 03/18/2022   MCV 83.9 03/18/2022   MCH 26.6 03/18/2022   RDW 15.2 03/18/2022   PLT 221 94/76/5465   Last metabolic panel Lab Results  Component Value Date   GLUCOSE 103 (H) 03/18/2022   NA 142 03/18/2022   K 3.5 03/18/2022   CL 109 03/18/2022   CO2 24 03/18/2022   BUN 8 03/18/2022   CREATININE 0.78 03/18/2022   GFRNONAA >60 03/18/2022   CALCIUM 8.7 (L) 03/18/2022   PROT 7.8 03/16/2022   ALBUMIN 4.0 03/16/2022   LABGLOB 2.6 01/04/2020   AGRATIO 1.7 01/04/2020   BILITOT 0.7 03/16/2022   ALKPHOS 79 03/16/2022   AST 17 03/16/2022   ALT 20 03/16/2022   ANIONGAP 9 03/18/2022   Last hemoglobin A1c Lab Results  Component Value Date   HGBA1C 5.8 (A) 02/06/2022   HGBA1C 5.8 02/06/2022   HGBA1C 5.8 02/06/2022   HGBA1C 5.8 02/06/2022   Last thyroid functions Lab Results  Component Value Date   TSH 2.04 01/03/2021   T3TOTAL 130 01/04/2020   Blood clx neg X 4d  ASSESSMENT/PLAN:  #1 pyelonephritis with sepsis. Pansensitive E. coli on urine culture.  Blood cultures negative. Gradually improving.  Finish Augmentin. CBC and be met today.  #2 acute kidney injury.  This had resolved by the time she was discharged. Be met today.  #3 postop hiatal hernia repair with TIF. Advancing diet as appropriate  and doing well.  #4 type 2 diabetes, class I obesity. Was doing well on  Ozempic but currently we are holding this medication until she feels completely back into her normal state of health.   Her fasting glucose at home this morning was 112.  FOLLOW UP: Keep appointment already set for 04/04/2022  Signed:  Crissie Sickles, MD           03/21/2022

## 2022-03-22 ENCOUNTER — Other Ambulatory Visit: Payer: Self-pay | Admitting: Gastroenterology

## 2022-03-23 ENCOUNTER — Other Ambulatory Visit (INDEPENDENT_AMBULATORY_CARE_PROVIDER_SITE_OTHER): Payer: 59

## 2022-03-23 DIAGNOSIS — D649 Anemia, unspecified: Secondary | ICD-10-CM | POA: Diagnosis not present

## 2022-03-23 LAB — IBC + FERRITIN
Ferritin: 67.7 ng/mL (ref 10.0–291.0)
Iron: 48 ug/dL (ref 42–145)
Saturation Ratios: 12.4 % — ABNORMAL LOW (ref 20.0–50.0)
TIBC: 387.8 ug/dL (ref 250.0–450.0)
Transferrin: 277 mg/dL (ref 212.0–360.0)

## 2022-03-24 ENCOUNTER — Other Ambulatory Visit: Payer: Self-pay | Admitting: Physician Assistant

## 2022-04-04 ENCOUNTER — Encounter: Payer: Self-pay | Admitting: Family Medicine

## 2022-04-04 ENCOUNTER — Ambulatory Visit (INDEPENDENT_AMBULATORY_CARE_PROVIDER_SITE_OTHER): Payer: 59 | Admitting: Family Medicine

## 2022-04-04 VITALS — BP 103/68 | HR 92 | Temp 98.0°F | Ht 60.0 in | Wt 151.8 lb

## 2022-04-04 DIAGNOSIS — Z7689 Persons encountering health services in other specified circumstances: Secondary | ICD-10-CM | POA: Diagnosis not present

## 2022-04-04 DIAGNOSIS — D500 Iron deficiency anemia secondary to blood loss (chronic): Secondary | ICD-10-CM | POA: Diagnosis not present

## 2022-04-04 DIAGNOSIS — R7303 Prediabetes: Secondary | ICD-10-CM

## 2022-04-04 LAB — CBC
HCT: 39.1 % (ref 36.0–46.0)
Hemoglobin: 12.3 g/dL (ref 12.0–15.0)
MCHC: 31.4 g/dL (ref 30.0–36.0)
MCV: 83.8 fl (ref 78.0–100.0)
Platelets: 307 10*3/uL (ref 150.0–400.0)
RBC: 4.66 Mil/uL (ref 3.87–5.11)
RDW: 15.6 % — ABNORMAL HIGH (ref 11.5–15.5)
WBC: 7.7 10*3/uL (ref 4.0–10.5)

## 2022-04-04 MED ORDER — ATORVASTATIN CALCIUM 20 MG PO TABS: 20.0000 mg | ORAL_TABLET | Freq: Every day | ORAL | 1 refills | Status: DC

## 2022-04-04 NOTE — Progress Notes (Signed)
OFFICE VISIT  04/04/2022  CC:  Chief Complaint  Patient presents with   Follow-up    Weight management   Patient is a 61 y.o. female who presents for follow-up diabetes, weight management, recent pyelonephritis.  INTERIM HX: Martha [name] continues to feel better.  On 02/06/2022 we had increased her Ozempic to the 2 mg weekly dosing. I last saw her 2 weeks ago as a hospital follow-up for pyelonephritis.  She was improving.   She had to stop taking her Ozempic for a short time due to this illness.  Recently followed up with gastroenterology and her diet has been advanced regarding solids. She is also added boost 30 mg protein twice daily.  Past Medical History:  Diagnosis Date   Allergic rhinitis    Anemia    Arthralgia of multiple sites    Gen rheum lab w/u normal/neg 11/2016 by prior PCP   Constipation    DDD (degenerative disc disease), lumbar 11/2020   R glut/hip pain->ortho, MRI->disc causing pinched nerve at L3-4 level on R, signif dz on L at L4-5 and L5-S1--->ESI on R 11/2020 very helpful, +PT.   Diabetes mellitus (HCC) 2022   Prediab 2017 --Old PCP records state only that her Hba1c was 6.1% in 2017: she took metformin x ? 6-12 mo, rx'd by wt loss MD.  A1c 6.1% Nov 2019. A1c 6.2% 12/2019. A1c 6.8% 12/2020.   Diverticulosis    Fatty liver    Noted on CT done during her hospitalization for pyelo 03/2022   GERD (gastroesophageal reflux disease)    +grade A esophagitis 06/2019 EGD.  Bx neg for eosinophilic esoph. 08/2019 esoph manometry normal, pH probe + abnl reflux not fully suppressed by PPI. PPI BID + H2 blocker as of 08/2019.   History of rectal bleeding    remote past->? rectal ulcer vs perf'd divertic-->need old records for clarif.   Hypercholesterolemia    Past hx of statin use, then was able to come off meds when she lost wt   Hypertension    Insomnia    Obesity, Class I, BMI 30-34.9    Palpitations    Pyelonephritis    hosp 03/2022   Right hip pain    MRI showed labral tear,  got steroid injection. Also troch bursitis.   Right shoulder pain 05/2020   RC tear, bicipital pathology, AC arthrosis and impingement-->to have surgery    Past Surgical History:  Procedure Laterality Date   64 HOUR PH STUDY N/A 08/05/2019   +GERD. Procedure: 24 HOUR PH STUDY;  Surgeon: Tressia Danas, MD;  Location: [facility];  Service: Gastroenterology;  Laterality: N/A;   ABDOMINAL HYSTERECTOMY  2005   for benign dx (No BSO)   BREAST BIOPSY Right 2005   CESAREAN SECTION     x 2   CHOLECYSTECTOMY  2011   COLONOSCOPY  most recent 01/2012   x 2->approx 2011 was done for BRBPR, unclear dx (rectal ulcer? perf'd diverticulum?).  Another about 2015 for abd pain w/u?  No polyps detected on either colonoscopy.  Need old records for clarification (GI MD was Dr. Thedore Mins in [place])--prior pcp records say "colonoscopy 01/2012 negative".   ESOPHAGEAL MANOMETRY N/A 08/05/2019   NORMAL Procedure: ESOPHAGEAL MANOMETRY (EM);  Surgeon: Tressia Danas, MD;  Location: [facility];  Service: Gastroenterology;  Laterality: N/A;   ESOPHAGOGASTRODUODENOSCOPY  06/2019     06/2019->reflux esophagitis, o/w normal.   ESOPHAGOGASTRODUODENOSCOPY N/A 02/27/2022   Procedure: ESOPHAGOGASTRODUODENOSCOPY (EGD);  Surgeon: Shellia Cleverly, DO;  Location: [facility];  Service: Gastroenterology;  Laterality: N/A;   HIATAL HERNIA REPAIR N/A 02/27/2022   Procedure: LAPAROSCOPIC REPAIR OF HIATAL HERNIA;  Surgeon: Gaynelle Adu, MD;  Location: [facility];  Service: General;  Laterality: N/A;   ROTATOR CUFF REPAIR Right 06/2021   TONSILLECTOMY AND ADENOIDECTOMY  2009   TRANSORAL INCISIONLESS FUNDOPLICATION N/A 02/27/2022   Procedure: TRANSORAL INCISIONLESS FUNDOPLICATION;  Surgeon: Shellia Cleverly, DO;  Location: [facility];  Service: Gastroenterology;  Laterality: N/A;    Outpatient Medications Prior to Visit  Medication Sig Dispense Refill   gabapentin (NEURONTIN) 300 MG capsule Take 300 mg by mouth every other day.      levocetirizine (XYZAL) 5 MG tablet Take 5 mg by mouth daily.      lisinopril (ZESTRIL) 10 MG tablet Take 1 tablet (10 mg total) by mouth daily. 90 tablet 3   meloxicam (MOBIC) 15 MG tablet Take 15 mg by mouth in the morning and at bedtime.     montelukast (SINGULAIR) 10 MG tablet Take 1 tablet (10 mg total) by mouth at bedtime. 90 tablet 3   ondansetron (ZOFRAN-ODT) 4 MG disintegrating tablet Take 1 tablet (4 mg total) by mouth every 6 (six) hours as needed for nausea or vomiting. 30 tablet 3   pantoprazole (PROTONIX) 40 MG tablet Take 1 tablet (40 mg total) by mouth 2 (two) times daily. 60 tablet 1   senna (SENOKOT) 8.6 MG TABS tablet Take 1 tablet by mouth at bedtime.     simethicone (MYLICON) 80 MG chewable tablet Chew 1 tablet (80 mg total) by mouth every 6 (six) hours as needed for flatulence (gas, bloating, abdominal discomfort). 30 tablet 0   traMADol (ULTRAM) 50 MG tablet Take 50 mg by mouth every 6 (six) hours as needed for moderate pain.     acetaminophen (TYLENOL) 500 MG tablet Take 1,000 mg by mouth every 6 (six) hours as needed for mild pain, fever or headache. (Patient not taking: Reported on 03/19/2022)     cholecalciferol (VITAMIN D) 25 MCG (1000 UT) tablet Take 1,000 Units by mouth daily. (Patient not taking: Reported on 03/19/2022)     Omega-3 Fatty Acids (OMEGA-3 FISH OIL PO) Take 1 capsule by mouth daily.  (Patient not taking: Reported on 03/19/2022)     OZEMPIC, 2 MG/DOSE, 8 MG/3ML SOPN Inject 2 mg into the skin once a week. Hold this medication, please discuss with your doctor (Patient not taking: Reported on 03/21/2022)     acetaminophen (TYLENOL) 160 MG/5ML solution Take 31.3 mLs (1,000 mg total) by mouth every 6 (six) hours as needed for mild pain, fever or headache. (Patient not taking: Reported on 04/04/2022) 120 mL 0   atorvastatin (LIPITOR) 20 MG tablet Take 1 tablet (20 mg total) by mouth daily. 90 tablet 1   gabapentin (NEURONTIN) 250 MG/5ML solution Take 6 mLs (300 mg total)  by mouth every other day for 20 doses. (Patient not taking: Reported on 04/04/2022) 60 mL 1   lidocaine (XYLOCAINE) 2 % solution Use as directed 5 mLs in the mouth or throat every 4 (four) hours as needed for mouth pain (esophageal pain, odynophagia). 60 mL 1   No facility-administered medications prior to visit.    Allergies  Allergen Reactions   Codeine Anaphylaxis    ROS As per HPI  PE:    04/04/2022    8:21 AM 03/21/2022   11:18 AM 03/21/2022   11:15 AM  Vitals with BMI  Height 5\' 0"   5\' 0"   Weight 151 lbs 13  oz  155 lbs 10 oz  BMI 29.65  30.39  Systolic 103 135 235  Diastolic 68 71 83  Pulse 92  72   Physical Exam  Gen: Alert, well appearing.  Patient is oriented to person, place, time, and situation. AFFECT: pleasant, lucid thought and speech. No further exam today.  LABS:  Last CBC Lab Results  Component Value Date   WBC 6.6 03/21/2022   HGB 10.7 (L) 03/21/2022   HCT 33.8 (L) 03/21/2022   MCV 83.2 03/21/2022   MCH 26.6 03/18/2022   RDW 15.5 03/21/2022   PLT 376.0 03/21/2022   Lab Results  Component Value Date   IRON 48 03/23/2022   TIBC 387.8 03/23/2022   FERRITIN 67.7 03/23/2022   Last metabolic panel Lab Results  Component Value Date   GLUCOSE 83 03/21/2022   NA 140 03/21/2022   K 3.8 03/21/2022   CL 102 03/21/2022   CO2 29 03/21/2022   BUN 8 03/21/2022   CREATININE 0.82 03/21/2022   GFRNONAA >60 03/18/2022   CALCIUM 9.5 03/21/2022   PROT 7.8 03/16/2022   ALBUMIN 4.0 03/16/2022   LABGLOB 2.6 01/04/2020   AGRATIO 1.7 01/04/2020   BILITOT 0.7 03/16/2022   ALKPHOS 79 03/16/2022   AST 17 03/16/2022   ALT 20 03/16/2022   ANIONGAP 9 03/18/2022   Last lipids Lab Results  Component Value Date   CHOL 110 11/14/2021   HDL 47.20 11/14/2021   LDLCALC 47 11/14/2021   TRIG 77.0 11/14/2021   CHOLHDL 2 11/14/2021   Last hemoglobin A1c Lab Results  Component Value Date   HGBA1C 5.8 (A) 02/06/2022   HGBA1C 5.8 02/06/2022   HGBA1C 5.8  02/06/2022   HGBA1C 5.8 02/06/2022   Last thyroid functions Lab Results  Component Value Date   TSH 2.04 01/03/2021   T3TOTAL 130 01/04/2020   IMPRESSION AND PLAN:  #1 weight management, history of borderline diabetes. Has done very well on the 2 mg dose of Ozempic--has never had side effects on Ozempic. Okay to restart at 2 mg dose weekly. Will recheck A1c in 2 months.  #2 postoperative anemia. She is not on iron. Recheck CBC today.  An After Visit Summary was printed and given to the patient.  FOLLOW UP: Return in about 2 months (around 06/04/2022) for f/u ozempic/a1c.  Signed:  Santiago Bumpers, MD           04/04/2022

## 2022-04-09 ENCOUNTER — Ambulatory Visit: Payer: 59 | Admitting: Family Medicine

## 2022-04-26 ENCOUNTER — Other Ambulatory Visit: Payer: Self-pay

## 2022-05-01 ENCOUNTER — Telehealth: Payer: Self-pay

## 2022-05-01 ENCOUNTER — Other Ambulatory Visit: Payer: Self-pay

## 2022-05-01 NOTE — Telephone Encounter (Signed)
Patient refill request.  [facility] mail order told patient she cannot get filled at her local pharmacy, Dr. Milinda Cave will need to send in new prescription to [facility]  OZEMPIC, 2 MG/DOSE, 8 MG/3ML [facility] [[id number]]

## 2022-05-01 NOTE — Telephone Encounter (Signed)
Please advise, last Rx was not prescribed by you

## 2022-05-02 MED ORDER — OZEMPIC (2 MG/DOSE) 8 MG/3ML ~~LOC~~ SOPN: 2.0000 mg | PEN_INJECTOR | SUBCUTANEOUS | 1 refills | Status: DC

## 2022-05-02 NOTE — Telephone Encounter (Signed)
Pt advised of rx refill, confirmed pharmacy location.

## 2022-05-02 NOTE — Telephone Encounter (Signed)
Junious Silk sent to [facility]

## 2022-05-10 ENCOUNTER — Other Ambulatory Visit: Payer: Self-pay | Admitting: Family Medicine

## 2022-05-15 ENCOUNTER — Other Ambulatory Visit: Payer: Self-pay | Admitting: Family Medicine

## 2022-05-16 ENCOUNTER — Encounter: Payer: Self-pay | Admitting: Family Medicine

## 2022-05-16 ENCOUNTER — Encounter (INDEPENDENT_AMBULATORY_CARE_PROVIDER_SITE_OTHER): Payer: Self-pay

## 2022-05-16 NOTE — Telephone Encounter (Signed)
Spoke with pt regarding message, appt scheduled with provider 05/17/22

## 2022-05-17 ENCOUNTER — Encounter: Payer: Self-pay | Admitting: Family Medicine

## 2022-05-17 ENCOUNTER — Ambulatory Visit (INDEPENDENT_AMBULATORY_CARE_PROVIDER_SITE_OTHER): Payer: 59 | Admitting: Family Medicine

## 2022-05-17 VITALS — Ht 60.0 in | Wt 156.8 lb

## 2022-05-17 DIAGNOSIS — N3001 Acute cystitis with hematuria: Secondary | ICD-10-CM

## 2022-05-17 DIAGNOSIS — R300 Dysuria: Secondary | ICD-10-CM | POA: Diagnosis not present

## 2022-05-17 LAB — POCT URINALYSIS DIPSTICK
Bilirubin, UA: NEGATIVE
Blood, UA: POSITIVE
Glucose, UA: NEGATIVE
Ketones, UA: POSITIVE
Nitrite, UA: NEGATIVE
Protein, UA: POSITIVE — AB
Spec Grav, UA: 1.025 (ref 1.010–1.025)
Urobilinogen, UA: 0.2 E.U./dL
pH, UA: 6 (ref 5.0–8.0)

## 2022-05-17 MED ORDER — SULFAMETHOXAZOLE-TRIMETHOPRIM 800-160 MG PO TABS: 1.0000 | ORAL_TABLET | Freq: Two times a day (BID) | ORAL | 0 refills | Status: DC

## 2022-05-17 NOTE — Progress Notes (Signed)
OFFICE VISIT  05/17/2022  CC:  Chief Complaint  Patient presents with   Possible UTI    Pt states she has itching, and burning while urinating starting 1 week ago.    Patient is a 61 y.o. female who presents for concern of possible urinary tract infection.  HPI: Has been feeling burning with urination consistently for around a week. Has some urinary frequency and urgency as well.  Cloudy urine. No vaginal discharge.  Some vaginal itching.  She was hospitalized for pyelonephritis about 2 months ago.  Urine grew E. coli, pansensitive.  Past Medical History:  Diagnosis Date   Allergic rhinitis    Anemia    Arthralgia of multiple sites    Gen rheum lab w/u normal/neg 11/2016 by prior PCP   Constipation    DDD (degenerative disc disease), lumbar 11/2020   R glut/hip pain->ortho, MRI->disc causing pinched nerve at L3-4 level on R, signif dz on L at L4-5 and L5-S1--->ESI on R 11/2020 very helpful, +PT.   Diabetes mellitus (HCC) 2022   Prediab 2017 --Old PCP records state only that her Hba1c was 6.1% in 2017: she took metformin x ? 6-12 mo, rx'd by wt loss MD.  A1c 6.1% Nov 2019. A1c 6.2% 12/2019. A1c 6.8% 12/2020.   Diverticulosis    Fatty liver    Noted on CT done during her hospitalization for pyelo 03/2022   GERD (gastroesophageal reflux disease)    +grade A esophagitis 06/2019 EGD.  Bx neg for eosinophilic esoph. 08/2019 esoph manometry normal, pH probe + abnl reflux not fully suppressed by PPI. PPI BID + H2 blocker as of 08/2019.   History of rectal bleeding    remote past->? rectal ulcer vs perf'd divertic-->need old records for clarif.   Hypercholesterolemia    Past hx of statin use, then was able to come off meds when she lost wt   Hypertension    Insomnia    Obesity, Class I, BMI 30-34.9    Palpitations    Pyelonephritis    hosp 03/2022   Right hip pain    MRI showed labral tear, got steroid injection. Also troch bursitis.   Right shoulder pain 05/2020   RC tear, bicipital  pathology, AC arthrosis and impingement-->to have surgery    Past Surgical History:  Procedure Laterality Date   47 HOUR PH STUDY N/A 08/05/2019   +GERD. Procedure: 24 HOUR PH STUDY;  Surgeon: Tressia Danas, MD;  Location: [facility];  Service: Gastroenterology;  Laterality: N/A;   ABDOMINAL HYSTERECTOMY  2005   for benign dx (No BSO)   BREAST BIOPSY Right 2005   CESAREAN SECTION     x 2   CHOLECYSTECTOMY  2011   COLONOSCOPY  most recent 01/2012   x 2->approx 2011 was done for BRBPR, unclear dx (rectal ulcer? perf'd diverticulum?).  Another about 2015 for abd pain w/u?  No polyps detected on either colonoscopy.  Need old records for clarification (GI MD was Dr. Thedore Mins in [place])--prior pcp records say "colonoscopy 01/2012 negative".   ESOPHAGEAL MANOMETRY N/A 08/05/2019   NORMAL Procedure: ESOPHAGEAL MANOMETRY (EM);  Surgeon: Tressia Danas, MD;  Location: [facility];  Service: Gastroenterology;  Laterality: N/A;   ESOPHAGOGASTRODUODENOSCOPY  06/2019     06/2019->reflux esophagitis, o/w normal.   ESOPHAGOGASTRODUODENOSCOPY N/A 02/27/2022   Procedure: ESOPHAGOGASTRODUODENOSCOPY (EGD);  Surgeon: Shellia Cleverly, DO;  Location: [facility];  Service: Gastroenterology;  Laterality: N/A;   HIATAL HERNIA REPAIR N/A 02/27/2022   Procedure: LAPAROSCOPIC REPAIR OF HIATAL HERNIA;  Surgeon: Greer Pickerel, MD;  Location: [facility];  Service: General;  Laterality: N/A;   ROTATOR CUFF REPAIR Right 06/2021   TONSILLECTOMY AND ADENOIDECTOMY  2009   TRANSORAL INCISIONLESS FUNDOPLICATION N/A 0000000   Procedure: TRANSORAL INCISIONLESS FUNDOPLICATION;  Surgeon: Lavena Bullion, DO;  Location: [facility];  Service: Gastroenterology;  Laterality: N/A;    Outpatient Medications Prior to Visit  Medication Sig Dispense Refill   atorvastatin (LIPITOR) 20 MG tablet TAKE 1 TABLET BY MOUTH DAILY 90 tablet 0   cholecalciferol (VITAMIN D) 25 MCG (1000 UT) tablet Take 1,000 Units by mouth daily.     gabapentin  (NEURONTIN) 300 MG capsule Take 300 mg by mouth every other day.     levocetirizine (XYZAL) 5 MG tablet Take 5 mg by mouth daily.      lisinopril (ZESTRIL) 10 MG tablet TAKE 1 TABLET BY MOUTH  DAILY 90 tablet 0   meloxicam (MOBIC) 15 MG tablet Take 15 mg by mouth in the morning and at bedtime.     montelukast (SINGULAIR) 10 MG tablet TAKE 1 TABLET BY MOUTH AT  BEDTIME 90 tablet 0   ondansetron (ZOFRAN-ODT) 4 MG disintegrating tablet Take 1 tablet (4 mg total) by mouth every 6 (six) hours as needed for nausea or vomiting. 30 tablet 3   pantoprazole (PROTONIX) 40 MG tablet Take 1 tablet (40 mg total) by mouth 2 (two) times daily. 60 tablet 1   senna (SENOKOT) 8.6 MG TABS tablet Take 1 tablet by mouth at bedtime.     simethicone (MYLICON) 80 MG chewable tablet Chew 1 tablet (80 mg total) by mouth every 6 (six) hours as needed for flatulence (gas, bloating, abdominal discomfort). 30 tablet 0   traMADol (ULTRAM) 50 MG tablet Take 50 mg by mouth every 6 (six) hours as needed for moderate pain.     acetaminophen (TYLENOL) 500 MG tablet Take 1,000 mg by mouth every 6 (six) hours as needed for mild pain, fever or headache. (Patient not taking: Reported on 03/19/2022)     Omega-3 Fatty Acids (OMEGA-3 FISH OIL PO) Take 1 capsule by mouth daily.  (Patient not taking: Reported on 03/19/2022)     OZEMPIC, 2 MG/DOSE, 8 MG/3ML SOPN Inject 2 mg into the skin once a week. (Patient not taking: Reported on 05/17/2022) 9 mL 1   No facility-administered medications prior to visit.    Allergies  Allergen Reactions   Codeine Anaphylaxis    ROS As per HPI  PE:    05/17/2022   10:24 AM 04/04/2022    8:21 AM 03/21/2022   11:18 AM  Vitals with BMI  Height 5\' 0"  5\' 0"    Weight 156 lbs 13 oz 151 lbs 13 oz   BMI 123456 [id number]   Systolic  [id number]  Diastolic  68 71  Pulse  92      Physical Exam  Gen: Alert, well appearing.  Patient is oriented to person, place, time, and situation. AFFECT: pleasant, lucid thought  and speech. No further exam today.  LABS:  Last CBC Lab Results  Component Value Date   WBC 7.7 04/04/2022   HGB 12.3 04/04/2022   HCT 39.1 04/04/2022   MCV 83.8 04/04/2022   MCH 26.6 03/18/2022   RDW 15.6 (H) 04/04/2022   PLT 307.0 [id number]   Last metabolic panel Lab Results  Component Value Date   GLUCOSE 83 03/21/2022   NA 140 03/21/2022   K 3.8 03/21/2022   CL 102 03/21/2022   CO2 29  03/21/2022   BUN 8 03/21/2022   CREATININE 0.82 03/21/2022   GFRNONAA >60 03/18/2022   CALCIUM 9.5 03/21/2022   PROT 7.8 03/16/2022   ALBUMIN 4.0 03/16/2022   LABGLOB 2.6 01/04/2020   AGRATIO 1.7 01/04/2020   BILITOT 0.7 03/16/2022   ALKPHOS 79 03/16/2022   AST 17 03/16/2022   ALT 20 03/16/2022   ANIONGAP 9 03/18/2022   POC CC dipstick UA today: 1+ blood, 1+ protein, 3+ LEU.  No gluc.  Nitrite NEG  IMPRESSION AND PLAN:  Acute UTI.  Bactrim DS 1 twice daily x 5 days. Urine sent for culture.  An After Visit Summary was printed and given to the patient.  FOLLOW UP: No follow-ups on file.  Signed:  Santiago Bumpers, MD           05/17/2022

## 2022-05-19 LAB — URINE CULTURE
MICRO NUMBER:: 13762104
SPECIMEN QUALITY:: ADEQUATE

## 2022-05-20 ENCOUNTER — Encounter: Payer: Self-pay | Admitting: Family Medicine

## 2022-05-21 NOTE — Telephone Encounter (Signed)
Reassure pt that there is no reason to think that her bladder infection won't respond to the antibiotic I rx'd last week.  If still with sx's after taking course of that antibiotic, however, ok to send in rx for cipro 500mg , 1 bid x 3d, #6, no RF.  If still with symptoms after this then she needs to come back in.

## 2022-05-21 NOTE — Telephone Encounter (Signed)
Pt is currently taking: sulfamethoxazole-trimethoprim (BACTRIM DS) 800-160 MG tablet Take 1 tablet by mouth 2 (two) times daily.    Please review and advise if abx needs to be changed.

## 2022-05-21 NOTE — Telephone Encounter (Signed)
Spoke with pt regarding recommendations, she is on her last day of abx. She will let us know if sxs have not resolved and additional med can be sent.

## 2022-05-25 ENCOUNTER — Encounter: Payer: Self-pay | Admitting: Family Medicine

## 2022-05-25 MED ORDER — CIPROFLOXACIN HCL 500 MG PO TABS: 500.0000 mg | ORAL_TABLET | Freq: Two times a day (BID) | ORAL | 0 refills | Status: AC

## 2022-05-25 NOTE — Telephone Encounter (Signed)
Spoke with patient with recommendations, provider stated ok for cipro prescription 500mg , 6 tablets total, 3 days twice daily. Patient voiced understanding.

## 2022-05-25 NOTE — Telephone Encounter (Signed)
Patient states she feels as if she does need the 2nd round of antibiotics for UTI.  She did an AZO home test and was + for uti.  Please call [phone]

## 2022-05-25 NOTE — Telephone Encounter (Signed)
Spoke with patient with recommendations, provider stated ok for cipro prescription 500mg, 6 tablets total, 3 days twice daily. Patient voiced understanding. 

## 2022-06-01 ENCOUNTER — Ambulatory Visit (INDEPENDENT_AMBULATORY_CARE_PROVIDER_SITE_OTHER): Payer: 59 | Admitting: Family Medicine

## 2022-06-01 ENCOUNTER — Encounter: Payer: Self-pay | Admitting: Family Medicine

## 2022-06-01 VITALS — BP 121/80 | HR 74 | Temp 98.1°F | Ht 60.0 in | Wt 155.2 lb

## 2022-06-01 DIAGNOSIS — N3001 Acute cystitis with hematuria: Secondary | ICD-10-CM | POA: Diagnosis not present

## 2022-06-01 DIAGNOSIS — N390 Urinary tract infection, site not specified: Secondary | ICD-10-CM

## 2022-06-01 LAB — POCT URINALYSIS DIPSTICK
Bilirubin, UA: NEGATIVE
Blood, UA: NEGATIVE
Glucose, UA: NEGATIVE
Ketones, UA: NEGATIVE
Nitrite, UA: NEGATIVE
Protein, UA: NEGATIVE
Spec Grav, UA: 1.01 (ref 1.010–1.025)
Urobilinogen, UA: 0.2 E.U./dL
pH, UA: 6 (ref 5.0–8.0)

## 2022-06-01 MED ORDER — CEFDINIR 300 MG PO CAPS: 300.0000 mg | ORAL_CAPSULE | Freq: Two times a day (BID) | ORAL | 0 refills | Status: DC

## 2022-06-01 NOTE — Progress Notes (Signed)
OFFICE VISIT  06/01/2022  CC:  Chief Complaint  Patient presents with   Follow-up    UTI; patient is still having frequency and burning with wiping.   Patient is a 61 y.o. female who presents for follow-up recent UTI.  INTERIM HX: Still has urinary frequency, nocturia x 3, some burning. No vag d/c.  No abd/suprapubic pain or flank pain or fevers.  Urine: Kleb pneu on clx 05/17/22 (amp and macrobid resist). Finished a course of bactrim x 5d. Sx's still present and UA otc + leuks so 3d of cipro rx'd on 05/25/22. She did otc UA again yest and still abnl.  Of note, she had E coli pyelo 3 mo ago (pansensitive).  Past Medical History:  Diagnosis Date   Allergic rhinitis    Anemia    Arthralgia of multiple sites    Gen rheum lab w/u normal/neg 11/2016 by prior PCP   Constipation    DDD (degenerative disc disease), lumbar 11/2020   R glut/hip pain->ortho, MRI->disc causing pinched nerve at L3-4 level on R, signif dz on L at L4-5 and L5-S1--->ESI on R 11/2020 very helpful, +PT.   Diabetes mellitus ([place]) 2022   Prediab 2017 --Old PCP records state only that her Hba1c was 6.1% in 2017: she took metformin x ? 6-12 mo, rx'd by wt loss MD.  A1c 6.1% Nov 2019. A1c 6.2% 12/2019. A1c 6.8% 12/2020.   Diverticulosis    Fatty liver    Noted on CT done during her hospitalization for pyelo 03/2022   GERD (gastroesophageal reflux disease)    +grade A esophagitis 06/2019 EGD.  Bx neg for eosinophilic esoph. [id number] esoph manometry normal, pH probe + abnl reflux not fully suppressed by PPI. PPI BID + H2 blocker as of 08/2019.   History of rectal bleeding    remote past->? rectal ulcer vs perf'd divertic-->need old records for clarif.   Hypercholesterolemia    Past hx of statin use, then was able to come off meds when she lost wt   Hypertension    Insomnia    Obesity, Class I, BMI 30-34.9    Palpitations    Pyelonephritis    hosp 03/2022   Right hip pain    MRI showed labral tear, got steroid  injection. Also troch bursitis.   Right shoulder pain 05/2020   RC tear, bicipital pathology, AC arthrosis and impingement-->to have surgery    Past Surgical History:  Procedure Laterality Date   59 HOUR PH STUDY N/A 08/05/2019   +GERD. Procedure: [place] STUDY;  Surgeon: Thornton Park, MD;  Location: [facility];  Service: Gastroenterology;  Laterality: N/A;   ABDOMINAL HYSTERECTOMY  2005   for benign dx (No BSO)   BREAST BIOPSY Right 2005   CESAREAN SECTION     x 2   CHOLECYSTECTOMY  2011   COLONOSCOPY  most recent 01/2012   x 2->approx 2011 was done for BRBPR, unclear dx (rectal ulcer? perf'd diverticulum?).  Another about 2015 for abd pain w/u?  No polyps detected on either colonoscopy.  Need old records for clarification (GI MD was Dr. Candiss [place] in [place])--prior pcp records say "colonoscopy 01/2012 negative".   ESOPHAGEAL MANOMETRY N/A 08/05/2019   NORMAL Procedure: ESOPHAGEAL MANOMETRY (EM);  Surgeon: Thornton Park, MD;  Location: [facility];  Service: Gastroenterology;  Laterality: N/A;   ESOPHAGOGASTRODUODENOSCOPY  06/2019     06/2019->reflux esophagitis, o/w normal.   ESOPHAGOGASTRODUODENOSCOPY N/A 02/27/2022   Procedure: ESOPHAGOGASTRODUODENOSCOPY (EGD);  Surgeon: Lavena Bullion, DO;  Location:  [facility];  Service: Gastroenterology;  Laterality: N/A;   HIATAL HERNIA REPAIR N/A 02/27/2022   Procedure: LAPAROSCOPIC REPAIR OF HIATAL HERNIA;  Surgeon: Gaynelle Adu, MD;  Location: [facility];  Service: General;  Laterality: N/A;   ROTATOR CUFF REPAIR Right 06/2021   TONSILLECTOMY AND ADENOIDECTOMY  2009   TRANSORAL INCISIONLESS FUNDOPLICATION N/A 02/27/2022   Procedure: TRANSORAL INCISIONLESS FUNDOPLICATION;  Surgeon: Shellia Cleverly, DO;  Location: [facility];  Service: Gastroenterology;  Laterality: N/A;    Outpatient Medications Prior to Visit  Medication Sig Dispense Refill   atorvastatin (LIPITOR) 20 MG tablet TAKE 1 TABLET BY MOUTH DAILY 90 tablet 0   cholecalciferol  (VITAMIN D) 25 MCG (1000 UT) tablet Take 1,000 Units by mouth daily.     gabapentin (NEURONTIN) 300 MG capsule Take 300 mg by mouth every other day.     levocetirizine (XYZAL) 5 MG tablet Take 5 mg by mouth daily.      lisinopril (ZESTRIL) 10 MG tablet TAKE 1 TABLET BY MOUTH  DAILY 90 tablet 0   meloxicam (MOBIC) 15 MG tablet Take 15 mg by mouth in the morning and at bedtime.     montelukast (SINGULAIR) 10 MG tablet TAKE 1 TABLET BY MOUTH AT  BEDTIME 90 tablet 0   ondansetron (ZOFRAN-ODT) 4 MG disintegrating tablet Take 1 tablet (4 mg total) by mouth every 6 (six) hours as needed for nausea or vomiting. 30 tablet 3   senna (SENOKOT) 8.6 MG TABS tablet Take 1 tablet by mouth at bedtime.     simethicone (MYLICON) 80 MG chewable tablet Chew 1 tablet (80 mg total) by mouth every 6 (six) hours as needed for flatulence (gas, bloating, abdominal discomfort). 30 tablet 0   traMADol (ULTRAM) 50 MG tablet Take 50 mg by mouth every 6 (six) hours as needed for moderate pain.     acetaminophen (TYLENOL) 500 MG tablet Take 1,000 mg by mouth every 6 (six) hours as needed for mild pain, fever or headache. (Patient not taking: Reported on 03/19/2022)     Omega-3 Fatty Acids (OMEGA-3 FISH OIL PO) Take 1 capsule by mouth daily.  (Patient not taking: Reported on 03/19/2022)     OZEMPIC, 2 MG/DOSE, 8 MG/3ML SOPN Inject 2 mg into the skin once a week. (Patient not taking: Reported on 05/17/2022) 9 mL 1   pantoprazole (PROTONIX) 40 MG tablet Take 1 tablet (40 mg total) by mouth 2 (two) times daily. 60 tablet 1   sulfamethoxazole-trimethoprim (BACTRIM DS) 800-160 MG tablet Take 1 tablet by mouth 2 (two) times daily. (Patient not taking: Reported on 06/01/2022) 10 tablet 0   No facility-administered medications prior to visit.    Allergies  Allergen Reactions   Codeine Anaphylaxis    ROS As per HPI  PE:    06/01/2022    8:05 AM 05/17/2022   10:24 AM 04/04/2022    8:21 AM  Vitals with BMI  Height 5\' 0"  5\' 0"  5\' 0"    Weight 155 lbs 3 oz 156 lbs 13 oz 151 lbs 13 oz  BMI 30.31 30.62 29.65  Systolic 121  103  Diastolic 80  68  Pulse 74  92     Physical Exam  Gen: Alert, well appearing.  Patient is oriented to person, place, time, and situation.. No further exam today  LABS:  Last CBC Lab Results  Component Value Date   WBC 7.7 04/04/2022   HGB 12.3 04/04/2022   HCT 39.1 04/04/2022   MCV 83.8 04/04/2022  MCH 26.6 03/18/2022   RDW 15.6 (H) 04/04/2022   PLT 307.0 04/04/2022   Last metabolic panel Lab Results  Component Value Date   GLUCOSE 83 03/21/2022   NA 140 03/21/2022   K 3.8 03/21/2022   CL 102 03/21/2022   CO2 29 03/21/2022   BUN 8 03/21/2022   CREATININE 0.82 03/21/2022   GFRNONAA >60 03/18/2022   CALCIUM 9.5 03/21/2022   PROT 7.8 03/16/2022   ALBUMIN 4.0 03/16/2022   LABGLOB 2.6 01/04/2020   AGRATIO 1.7 01/04/2020   BILITOT 0.7 03/16/2022   ALKPHOS 79 03/16/2022   AST 17 03/16/2022   ALT 20 03/16/2022   ANIONGAP 9 03/18/2022   Last lipids Lab Results  Component Value Date   CHOL 110 11/14/2021   HDL 47.20 11/14/2021   LDLCALC 47 11/14/2021   TRIG 77.0 11/14/2021   CHOLHDL 2 11/14/2021   Last hemoglobin A1c Lab Results  Component Value Date   HGBA1C 5.8 (A) 02/06/2022   HGBA1C 5.8 02/06/2022   HGBA1C 5.8 02/06/2022   HGBA1C 5.8 02/06/2022   Last thyroid functions Lab Results  Component Value Date   TSH 2.04 01/03/2021   T3TOTAL 130 01/04/2020   IMPRESSION AND PLAN:  Persistent UTI despite 5d bactrim followed by 3d cipro. UA today with 2+ LEUK, o/w normal. Sent for c/s. Omnicef bid x 7d. Refer to urol for further eval/mgmt.  An After Visit Summary was printed and given to the patient.  FOLLOW UP: Return for keep appt already set up for 9/5.  Signed:  Santiago Bumpers, MD           06/01/2022

## 2022-06-02 LAB — URINE CULTURE
MICRO NUMBER:: 13833414
SPECIMEN QUALITY:: ADEQUATE

## 2022-06-04 ENCOUNTER — Telehealth: Payer: Self-pay

## 2022-06-04 ENCOUNTER — Ambulatory Visit (INDEPENDENT_AMBULATORY_CARE_PROVIDER_SITE_OTHER): Payer: 59 | Admitting: Obstetrics & Gynecology

## 2022-06-04 ENCOUNTER — Other Ambulatory Visit (HOSPITAL_COMMUNITY)
Admission: RE | Admit: 2022-06-04 | Discharge: 2022-06-04 | Disposition: A | Discharge status: Home / Self Care | Payer: 59 | Source: Ambulatory Visit | Attending: Obstetrics & Gynecology | Admitting: Obstetrics & Gynecology

## 2022-06-04 ENCOUNTER — Encounter: Payer: Self-pay | Admitting: Obstetrics & Gynecology

## 2022-06-04 VITALS — BP 128/78 | HR 87 | Resp 16 | Ht 59.0 in | Wt 152.0 lb

## 2022-06-04 DIAGNOSIS — N949 Unspecified condition associated with female genital organs and menstrual cycle: Secondary | ICD-10-CM | POA: Diagnosis not present

## 2022-06-04 DIAGNOSIS — N390 Urinary tract infection, site not specified: Secondary | ICD-10-CM | POA: Diagnosis not present

## 2022-06-04 DIAGNOSIS — Z01419 Encounter for gynecological examination (general) (routine) without abnormal findings: Secondary | ICD-10-CM

## 2022-06-04 NOTE — Telephone Encounter (Signed)
Patient returning call about lab results.  Please call patient back when available.  

## 2022-06-04 NOTE — Addendum Note (Signed)
Addended by: Kathie Dike on: 06/04/2022 11:46 AM   Modules accepted: Orders

## 2022-06-04 NOTE — Progress Notes (Signed)
Last Mammogram: 11/09/21- negative Last Pap Smear:  hysterectomy Last Colon Screening;  2021 Seat Belts:   Yes Sun Screen:   Yes Dental Check Up:  Yes Brush & Floss:  Yes

## 2022-06-04 NOTE — Telephone Encounter (Signed)
Spoke with pt regarding results.  

## 2022-06-04 NOTE — Progress Notes (Signed)
Subjective:     Martha [name] is a 61 y.o. female here for a routine exam.  Current complaints: recurrent UTIs and urosepis in June.  She was referred to a urologist and is waiting on an appt.  Se does have some vaginal burning and has avoided sexual intercourse.     Gynecologic History No LMP recorded. Patient has had a hysterectomy. Contraception: status post hysterectomy Last Pap: 2022. Results were: normal Last mammogram: 2023. Results were: normal  Obstetric History OB History  Gravida Para Term Preterm AB Living  2 2          SAB IAB Ectopic Multiple Live Births          2    # Outcome Date GA Lbr Len/2nd Weight Sex Delivery Anes PTL Lv  2 Para      CS-LTranv     1 Para      CS-LTranv        The following portions of the patient's history were reviewed and updated as appropriate: allergies, current medications, past family history, past medical history, past social history, past surgical history, and problem list.  Review of Systems Pertinent items noted in HPI and remainder of comprehensive ROS otherwise negative.    Objective:     Vitals:   06/04/22 1034  BP: 128/78  Pulse: 87  Resp: 16  Weight: 152 lb (68.9 kg)  Height: 4\' 11"  (1.499 m)   Vitals:  WNL General appearance: alert, cooperative and no distress  HEENT: Normocephalic, without obvious abnormality, atraumatic Eyes: negative Throat: lips, mucosa, and tongue normal; teeth and gums normal  Respiratory: Clear to auscultation bilaterally  CV: Regular rate and rhythm  Breasts:  Normal appearance, no masses or tenderness, no nipple retraction or dimpling  GI: Soft, non-tender; bowel sounds normal; no masses,  no organomegaly  GU: External Genitalia:  Tanner V, no lesion Urethra:  No prolapse   Vagina: Pink, normal rugae, no blood, + thin discharge, no odor  Cervix: Surgically absent  Uterus:  Surgically absentcontour, non tender  Adnexa: Surgically absent  Musculoskeletal: No edema, redness or  tenderness in the calves or thighs  Skin: No lesions or rash  Lymphatic: Axillary adenopathy: none     Psychiatric: Normal mood and behavior        Assessment:    Healthy female exam.    Plan:   Aptima for vaginal discharge Recurrent UTI--pt si waiting for urology office to contact her.

## 2022-06-05 ENCOUNTER — Other Ambulatory Visit: Payer: Self-pay

## 2022-06-05 DIAGNOSIS — B9689 Other specified bacterial agents as the cause of diseases classified elsewhere: Secondary | ICD-10-CM

## 2022-06-05 DIAGNOSIS — B379 Candidiasis, unspecified: Secondary | ICD-10-CM

## 2022-06-05 LAB — CERVICOVAGINAL ANCILLARY ONLY
Bacterial Vaginitis (gardnerella): POSITIVE — AB
Candida Glabrata: POSITIVE — AB
Candida Vaginitis: NEGATIVE
Comment: NEGATIVE
Comment: NEGATIVE
Comment: NEGATIVE

## 2022-06-05 MED ORDER — FLUCONAZOLE 150 MG PO TABS: 150.0000 mg | ORAL_TABLET | Freq: Once | ORAL | 0 refills | Status: AC

## 2022-06-05 MED ORDER — METRONIDAZOLE 500 MG PO TABS: 500.0000 mg | ORAL_TABLET | Freq: Two times a day (BID) | ORAL | 0 refills | Status: DC

## 2022-06-05 NOTE — Progress Notes (Signed)
Flagyl sent for BV and Diflucan sent for yeast per protocol.

## 2022-06-12 ENCOUNTER — Ambulatory Visit (INDEPENDENT_AMBULATORY_CARE_PROVIDER_SITE_OTHER): Payer: 59 | Admitting: Family Medicine

## 2022-06-12 ENCOUNTER — Encounter: Payer: Self-pay | Admitting: Family Medicine

## 2022-06-12 VITALS — BP 107/71 | HR 73 | Temp 98.0°F | Ht 59.0 in | Wt 155.4 lb

## 2022-06-12 DIAGNOSIS — E66811 Obesity, class 1: Secondary | ICD-10-CM

## 2022-06-12 DIAGNOSIS — I10 Essential (primary) hypertension: Secondary | ICD-10-CM

## 2022-06-12 DIAGNOSIS — E669 Obesity, unspecified: Secondary | ICD-10-CM | POA: Diagnosis not present

## 2022-06-12 DIAGNOSIS — Z23 Encounter for immunization: Secondary | ICD-10-CM

## 2022-06-12 DIAGNOSIS — E119 Type 2 diabetes mellitus without complications: Secondary | ICD-10-CM | POA: Diagnosis not present

## 2022-06-12 DIAGNOSIS — E7800 Pure hypercholesterolemia, unspecified: Secondary | ICD-10-CM

## 2022-06-12 DIAGNOSIS — N390 Urinary tract infection, site not specified: Secondary | ICD-10-CM

## 2022-06-12 LAB — COMPREHENSIVE METABOLIC PANEL
ALT: 44 U/L — ABNORMAL HIGH (ref 0–35)
AST: 41 U/L — ABNORMAL HIGH (ref 0–37)
Albumin: 4.1 g/dL (ref 3.5–5.2)
Alkaline Phosphatase: 80 U/L (ref 39–117)
BUN: 12 mg/dL (ref 6–23)
CO2: 26 mEq/L (ref 19–32)
Calcium: 9.6 mg/dL (ref 8.4–10.5)
Chloride: 105 mEq/L (ref 96–112)
Creatinine, Ser: 1.05 mg/dL (ref 0.40–1.20)
GFR: 56.75 mL/min — ABNORMAL LOW (ref >60.00)
Glucose, Bld: 100 mg/dL — ABNORMAL HIGH (ref 70–99)
Potassium: 4.7 mEq/L (ref 3.5–5.1)
Sodium: 141 mEq/L (ref 135–145)
Total Bilirubin: 0.5 mg/dL (ref 0.2–1.2)
Total Protein: 7 g/dL (ref 6.0–8.3)

## 2022-06-12 LAB — LIPID PANEL
Cholesterol: 133 mg/dL (ref 0–200)
HDL: 59.2 mg/dL (ref >39.00)
LDL Cholesterol: 61 mg/dL (ref 0–99)
NonHDL: 73.8
Total CHOL/HDL Ratio: 2
Triglycerides: 66 mg/dL (ref 0.0–149.0)
VLDL: 13.2 mg/dL (ref 0.0–40.0)

## 2022-06-12 LAB — POCT GLYCOSYLATED HEMOGLOBIN (HGB A1C)
HbA1c POC (<> result, manual entry): 5.8 % (ref 4.0–5.6)
HbA1c, POC (controlled diabetic range): 5.8 % (ref 0.0–7.0)
HbA1c, POC (prediabetic range): 5.8 % (ref 5.7–6.4)
Hemoglobin A1C: 5.8 % — AB (ref 4.0–5.6)

## 2022-06-12 LAB — MICROALBUMIN / CREATININE URINE RATIO
Creatinine,U: 42 mg/dL
Microalb Creat Ratio: 1.7 mg/g (ref 0.0–30.0)
Microalb, Ur: <0.7 mg/dL (ref 0.0–1.9)

## 2022-06-12 NOTE — Progress Notes (Signed)
OFFICE VISIT  06/12/2022  CC:  Chief Complaint  Patient presents with   Diabetes    Pt is fasting   Hypertension   Patient is a 61 y.o. female who presents for f/u DM 2, HTN, and wt mgmt.  INTERIM HX: She states she is feeling well. Home glucoses ranging 90-112.  Recent visit to GYN, diagnosed with bacterial vaginosis, prescribed metronidazole.  Has follow-up with them later this week.  She has no recent dysuria, urinary urgency or frequency, or suprapubic discomfort.  Martha [name] has been on Ozempic since February of this year and continues to tolerate this well and is working hard on diet. She is walking for exercise.  ROS as above, plus--> no fevers, no CP, no SOB, no wheezing, no cough, no dizziness, no HAs, no rashes, no melena/hematochezia.  No polyuria or polydipsia.  No myalgias or arthralgias.  No focal weakness, paresthesias, or tremors.  No acute vision or hearing abnormalities.  No dysuria or unusual/new urinary urgency or frequency.  No recent changes in lower legs. No n/v/d or abd pain.  No palpitations.     Past Medical History:  Diagnosis Date   Allergic rhinitis    Anemia    Arthralgia of multiple sites    Gen rheum lab w/u normal/neg 11/2016 by prior PCP   Constipation    DDD (degenerative disc disease), lumbar 11/2020   R glut/hip pain->ortho, MRI->disc causing pinched nerve at L3-4 level on R, signif dz on L at L4-5 and L5-S1--->ESI on R 11/2020 very helpful, +PT.   Diabetes mellitus ([facility]) 2022   Prediab 2017 --Old PCP records state only that her Hba1c was 6.1% in 2017: she took metformin x ? 6-12 mo, rx'd by wt loss MD.  A1c 6.1% Nov 2019. A1c 6.2% 12/2019. A1c 6.8% 12/2020.   Diverticulosis    Fatty liver    Noted on CT done during her hospitalization for pyelo 03/2022   GERD (gastroesophageal reflux disease)    +grade A esophagitis 06/2019 EGD.  Bx neg for eosinophilic esoph. 08/2019 esoph manometry normal, pH probe + abnl reflux not fully suppressed by PPI. PPI BID  + H2 blocker as of 08/2019.   History of rectal bleeding    remote past->? rectal ulcer vs perf'd divertic-->need old records for clarif.   Hypercholesterolemia    Past hx of statin use, then was able to come off meds when she lost wt   Hypertension    Insomnia    Obesity, Class I, BMI 30-34.9    Palpitations    Pyelonephritis    hosp 03/2022   Right hip pain    MRI showed labral tear, got steroid injection. Also troch bursitis.   Right shoulder pain 05/2020   RC tear, bicipital pathology, AC arthrosis and impingement-->to have surgery    Past Surgical History:  Procedure Laterality Date   51 HOUR PH STUDY N/A 08/05/2019   +GERD. Procedure: 24 HOUR PH STUDY;  Surgeon: Tressia Danas, MD;  Location: [facility];  Service: Gastroenterology;  Laterality: N/A;   ABDOMINAL HYSTERECTOMY  2005   for benign dx (No BSO)   BREAST BIOPSY Right 2005   CESAREAN SECTION     x 2   CHOLECYSTECTOMY  2011   COLONOSCOPY  most recent 01/2012   x 2->approx 2011 was done for BRBPR, unclear dx (rectal ulcer? perf'd diverticulum?).  Another about 2015 for abd pain w/u?  No polyps detected on either colonoscopy.  Need old records for clarification (GI MD was  Dr. Candiss Norse in [place])--prior pcp records say "colonoscopy 01/2012 negative".   ESOPHAGEAL MANOMETRY N/A 08/05/2019   NORMAL Procedure: ESOPHAGEAL MANOMETRY (EM);  Surgeon: Thornton Park, MD;  Location: [facility];  Service: Gastroenterology;  Laterality: N/A;   ESOPHAGOGASTRODUODENOSCOPY  06/2019     06/2019->reflux esophagitis, o/w normal.   ESOPHAGOGASTRODUODENOSCOPY N/A 02/27/2022   Procedure: ESOPHAGOGASTRODUODENOSCOPY (EGD);  Surgeon: Lavena Bullion, DO;  Location: [facility];  Service: Gastroenterology;  Laterality: N/A;   HIATAL HERNIA REPAIR N/A 02/27/2022   Procedure: LAPAROSCOPIC REPAIR OF HIATAL HERNIA;  Surgeon: Greer Pickerel, MD;  Location: [facility];  Service: General;  Laterality: N/A;   ROTATOR CUFF REPAIR Right 06/2021   TONSILLECTOMY  AND ADENOIDECTOMY  2009   TRANSORAL INCISIONLESS FUNDOPLICATION N/A 0000000   Procedure: TRANSORAL INCISIONLESS FUNDOPLICATION;  Surgeon: Lavena Bullion, DO;  Location: [facility];  Service: Gastroenterology;  Laterality: N/A;    Outpatient Medications Prior to Visit  Medication Sig Dispense Refill   acetaminophen (TYLENOL) 500 MG tablet Take 1,000 mg by mouth every 6 (six) hours as needed for mild pain, fever or headache.     atorvastatin (LIPITOR) 20 MG tablet TAKE 1 TABLET BY MOUTH DAILY 90 tablet 0   cholecalciferol (VITAMIN D) 25 MCG (1000 UT) tablet Take 1,000 Units by mouth daily.     gabapentin (NEURONTIN) 300 MG capsule Take 300 mg by mouth every other day.     levocetirizine (XYZAL) 5 MG tablet Take 5 mg by mouth daily.      lisinopril (ZESTRIL) 10 MG tablet TAKE 1 TABLET BY MOUTH  DAILY 90 tablet 0   meloxicam (MOBIC) 15 MG tablet Take 15 mg by mouth in the morning and at bedtime.     metroNIDAZOLE (FLAGYL) 500 MG tablet Take 1 tablet (500 mg total) by mouth 2 (two) times daily. 14 tablet 0   montelukast (SINGULAIR) 10 MG tablet TAKE 1 TABLET BY MOUTH AT  BEDTIME 90 tablet 0   ondansetron (ZOFRAN-ODT) 4 MG disintegrating tablet Take 1 tablet (4 mg total) by mouth every 6 (six) hours as needed for nausea or vomiting. 30 tablet 3   OZEMPIC, 2 MG/DOSE, 8 MG/3ML SOPN Inject 2 mg into the skin once a week. 9 mL 1   pantoprazole (PROTONIX) 40 MG tablet Take 1 tablet (40 mg total) by mouth 2 (two) times daily. 60 tablet 1   senna (SENOKOT) 8.6 MG TABS tablet Take 1 tablet by mouth at bedtime.     simethicone (MYLICON) 80 MG chewable tablet Chew 1 tablet (80 mg total) by mouth every 6 (six) hours as needed for flatulence (gas, bloating, abdominal discomfort). 30 tablet 0   traMADol (ULTRAM) 50 MG tablet Take 50 mg by mouth every 6 (six) hours as needed for moderate pain.     Omega-3 Fatty Acids (OMEGA-3 FISH OIL PO) Take 1 capsule by mouth daily.  (Patient not taking: Reported on  03/19/2022)     cefdinir (OMNICEF) 300 MG capsule Take 1 capsule (300 mg total) by mouth 2 (two) times daily. (Patient not taking: Reported on 06/12/2022) 14 capsule 0   No facility-administered medications prior to visit.    Allergies  Allergen Reactions   Codeine Anaphylaxis    ROS As per HPI  PE:    06/12/2022   10:03 AM 06/04/2022   10:34 AM 06/01/2022    8:05 AM  Vitals with BMI  Height 4\' 11"  4\' 11"  5\' 0"   Weight 155 lbs 6 oz 152 lbs 155 lbs 3 oz  BMI 31.37 30.68 30.31  Systolic 107 128 539  Diastolic 71 78 80  Pulse 73 87 74   Physical Exam  Gen: Alert, well appearing.  Patient is oriented to person, place, time, and situation. AFFECT: pleasant, lucid thought and speech. CV: RRR, no m/r/g.   LUNGS: CTA bilat, nonlabored resps, good aeration in all lung fields.. EXT: no clubbing or cyanosis.  no edema.    LABS:  Last CBC Lab Results  Component Value Date   WBC 7.7 04/04/2022   HGB 12.3 04/04/2022   HCT 39.1 04/04/2022   MCV 83.8 04/04/2022   MCH 26.6 03/18/2022   RDW 15.6 (H) 04/04/2022   PLT 307.0 04/04/2022   Last metabolic panel Lab Results  Component Value Date   GLUCOSE 83 03/21/2022   NA 140 03/21/2022   K 3.8 03/21/2022   CL 102 03/21/2022   CO2 29 03/21/2022   BUN 8 03/21/2022   CREATININE 0.82 03/21/2022   GFRNONAA >60 03/18/2022   CALCIUM 9.5 03/21/2022   PROT 7.8 03/16/2022   ALBUMIN 4.0 03/16/2022   LABGLOB 2.6 01/04/2020   AGRATIO 1.7 01/04/2020   BILITOT 0.7 03/16/2022   ALKPHOS 79 03/16/2022   AST 17 03/16/2022   ALT 20 03/16/2022   ANIONGAP 9 03/18/2022   Last lipids Lab Results  Component Value Date   CHOL 110 11/14/2021   HDL 47.20 11/14/2021   LDLCALC 47 11/14/2021   TRIG 77.0 11/14/2021   CHOLHDL 2 11/14/2021   Last hemoglobin A1c Lab Results  Component Value Date   HGBA1C 5.8 (A) 06/12/2022   HGBA1C 5.8 06/12/2022   HGBA1C 5.8 06/12/2022   HGBA1C 5.8 06/12/2022   Last thyroid functions Lab Results  Component  Value Date   TSH 2.04 01/03/2021   T3TOTAL 130 01/04/2020   IMPRESSION AND PLAN:  1 diabetes without complication.  Well-controlled. POC Hba1c is 5.8% today. Continue Ozempic 2 mg subcu weekly. Urine microalbumin/creatinine today.  2.  Hypertension, well controlled on lisinopril 10 mg a day. Electrolytes and creatinine today.  #3 hypercholesterolemia.  Doing well on a atorvastatin 20 mg a day. Lipid panel and hepatic panel today.  #4 obesity class I. Doing well on Ozempic 2 mg subcu weekly. Weight was around 270 when she started the medication about 7 months ago.  #5 recurrent UTI.  History of urosepsis a few months ago. Currently without symptoms. We are setting up a urology evaluation for her.  An After Visit Summary was printed and given to the patient.  FOLLOW UP: Return in about 3 months (around 09/11/2022) for annual CPE (fasting).  Signed:  Santiago Bumpers, MD           06/12/2022

## 2022-06-13 ENCOUNTER — Ambulatory Visit: Payer: 59 | Admitting: *Deleted

## 2022-06-13 ENCOUNTER — Other Ambulatory Visit (HOSPITAL_COMMUNITY)
Admission: RE | Admit: 2022-06-13 | Discharge: 2022-06-13 | Disposition: A | Discharge status: Home / Self Care | Payer: 59 | Source: Ambulatory Visit | Attending: Obstetrics & Gynecology | Admitting: Obstetrics & Gynecology

## 2022-06-13 DIAGNOSIS — B9689 Other specified bacterial agents as the cause of diseases classified elsewhere: Secondary | ICD-10-CM

## 2022-06-13 DIAGNOSIS — N760 Acute vaginitis: Secondary | ICD-10-CM | POA: Insufficient documentation

## 2022-06-13 NOTE — Progress Notes (Signed)
Pt here for TOC only swab for BV and yeast per Dr Penne Lash

## 2022-06-14 ENCOUNTER — Other Ambulatory Visit: Payer: Self-pay | Admitting: Obstetrics & Gynecology

## 2022-06-14 ENCOUNTER — Telehealth: Payer: Self-pay

## 2022-06-14 DIAGNOSIS — R7401 Elevation of levels of liver transaminase levels: Secondary | ICD-10-CM

## 2022-06-14 LAB — CERVICOVAGINAL ANCILLARY ONLY
Bacterial Vaginitis (gardnerella): POSITIVE — AB
Candida Glabrata: POSITIVE — AB
Candida Vaginitis: NEGATIVE
Comment: NEGATIVE
Comment: NEGATIVE
Comment: NEGATIVE

## 2022-06-14 MED ORDER — CLINDAMYCIN PHOSPHATE 2 % VA CREA: 1.0000 | TOPICAL_CREAM | Freq: Every day | VAGINAL | 0 refills | Status: DC

## 2022-06-14 NOTE — Telephone Encounter (Signed)
-----   Message from Jeoffrey Massed, MD sent at 06/14/2022  1:13 PM EDT ----- Pls add hepatitis C antibody, hepatitis B surface antigen, and hepatitis B core antibody IgM.  Diagnosis is elevated transaminase.

## 2022-06-15 LAB — HEPATITIS B SURFACE ANTIGEN: Hepatitis B Surface Ag: NONREACTIVE

## 2022-06-15 LAB — HEPATITIS C ANTIBODY: Hepatitis C Ab: NONREACTIVE

## 2022-06-15 LAB — HEPATITIS B CORE ANTIBODY, IGM: Hep B C IgM: NONREACTIVE

## 2022-06-25 ENCOUNTER — Ambulatory Visit (INDEPENDENT_AMBULATORY_CARE_PROVIDER_SITE_OTHER): Payer: 59

## 2022-06-25 ENCOUNTER — Other Ambulatory Visit (HOSPITAL_COMMUNITY)
Admission: RE | Admit: 2022-06-25 | Discharge: 2022-06-25 | Disposition: A | Discharge status: Home / Self Care | Payer: 59 | Source: Ambulatory Visit | Attending: Obstetrics & Gynecology | Admitting: Obstetrics & Gynecology

## 2022-06-25 VITALS — BP 117/77 | HR 76

## 2022-06-25 DIAGNOSIS — N760 Acute vaginitis: Secondary | ICD-10-CM | POA: Insufficient documentation

## 2022-06-25 DIAGNOSIS — B379 Candidiasis, unspecified: Secondary | ICD-10-CM | POA: Insufficient documentation

## 2022-06-25 DIAGNOSIS — B9689 Other specified bacterial agents as the cause of diseases classified elsewhere: Secondary | ICD-10-CM | POA: Insufficient documentation

## 2022-06-25 NOTE — Progress Notes (Signed)
Pt presents to office for toc self swab. Pt is not having any symptoms at this time. Aptima swab sent to lab, will follow up depending results.

## 2022-06-26 ENCOUNTER — Encounter: Payer: Self-pay | Admitting: Obstetrics & Gynecology

## 2022-06-26 LAB — CERVICOVAGINAL ANCILLARY ONLY
Bacterial Vaginitis (gardnerella): NEGATIVE
Candida Glabrata: POSITIVE — AB
Candida Vaginitis: NEGATIVE
Comment: NEGATIVE
Comment: NEGATIVE
Comment: NEGATIVE

## 2022-06-27 ENCOUNTER — Encounter: Payer: Self-pay | Admitting: Gastroenterology

## 2022-06-27 ENCOUNTER — Ambulatory Visit (INDEPENDENT_AMBULATORY_CARE_PROVIDER_SITE_OTHER): Payer: 59 | Admitting: Gastroenterology

## 2022-06-27 VITALS — BP 110/68 | HR 90 | Ht 59.0 in | Wt 156.0 lb

## 2022-06-27 DIAGNOSIS — K2100 Gastro-esophageal reflux disease with esophagitis, without bleeding: Secondary | ICD-10-CM

## 2022-06-27 DIAGNOSIS — Z9889 Other specified postprocedural states: Secondary | ICD-10-CM

## 2022-06-27 DIAGNOSIS — R1310 Dysphagia, unspecified: Secondary | ICD-10-CM

## 2022-06-27 NOTE — Progress Notes (Signed)
Chief Complaint:    Dysphagia  GI History: Martha [name] is a 61 y.o. female with a history of diabetes, arthralgias, HTN, HLD, spinal stenosis (which limits exercise), with a longstanding history of GERD as outlined below, now s/p concomitant laparoscopic hiatal hernia repair and Transoral Incisionless Fundoplication (cTIF) on 0000000.   GERD history: -Index symptoms: Heartburn, regurgitation, nocturnal cough/choking -Exacerbating features: coffee,eating close to bedtime, tomato based sauces, red wine -Medications trialed: Pantoprazole, famotidine, Carafate, esomeprazole, omeprazole -Current medications: Protonix 40 mg bid since surgery -Complications: hiatal hernia, erosive esophagitis   GERD evaluation: -Last EGD: 06/2019 -Barium esophagram: None -Esophageal Manometry: 07/2019: 2.4 cm hiatal hernia, Normal motility -pH/Impedance: 07/2019 (on PPI): pH <4 in 8% of time, [place] 16.3.  Elevated reflux episodes in supine position.  Negative symptom correlation by SAP -Bravo: N/A   Endoscopic History: - 06/26/2019: EGD: LA Grade A esophagitis, biopsies negative for EOE, small HH - 02/27/2022: Laparoscopic hiatal hernia repair with concomitant TIF.  24 Serofuse fasteners placed with 300 degree valve measuring 3 cm in length  -6/9-11, 2023: Hospital admission with nausea/vomiting, UTI sepsis, pyelonephritis, AKI.  CT A/P: Normal GI tract - 03/19/2022: GI follow-up appointment.  Started Protonix wean.  Recommended holding Ozempic for the time being allow recovery from UTI sepsis and nausea/vomiting  HPI:     Patient is a 61 y.o. female presenting to the Gastroenterology Clinic for follow-up.  She is cTIF on 02/27/2022.  Initially with excellent response without any breakthrough reflux symptoms after surgery.  However, she was hospitalized 6/9-11 with nausea/vomiting, UTI sepsis, pyelonephritis, and AKI.  She is now back on Ozempic.  Main issue today is a couple episodes of solid  food dysphagia, then regurgitates food back out, and can actually go back to her meal and eat without issue. Points to mid sternum. Has "learned" how to manage, and last eisode was 3 weeks ago. Now manages by slowing down eating, fluids with meal, and can go back to eating w/o issue.  Otherwise, reflux symptoms have completely resolved. No longer taking any PPI and has had 0 breakthrough events.  Very happy with surgical results.  No new imaging since last appointment.   Review of systems:     No chest pain, no SOB, no fevers, no urinary sx   Past Medical History:  Diagnosis Date   Allergic rhinitis    Anemia    Arthralgia of multiple sites    Gen rheum lab w/u normal/neg 11/2016 by prior PCP   Constipation    DDD (degenerative disc disease), lumbar 11/2020   R glut/hip pain->ortho, MRI->disc causing pinched nerve at L3-4 level on R, signif dz on L at L4-5 and L5-S1--->ESI on R 11/2020 very helpful, +PT.   Diabetes mellitus ([place]) 2022   Prediab 2017 --Old PCP records state only that her Hba1c was 6.1% in 2017: she took metformin x ? 6-12 mo, rx'd by wt loss MD.  A1c 6.1% Nov 2019. A1c 6.2% 12/2019. A1c 6.8% 12/2020.   Diverticulosis    Fatty liver    Noted on CT done during her hospitalization for pyelo 03/2022   GERD (gastroesophageal reflux disease)    +grade A esophagitis 06/2019 EGD.  Bx neg for eosinophilic esoph. [id number] esoph manometry normal, pH probe + abnl reflux not fully suppressed by PPI. PPI BID + H2 blocker as of 08/2019.   History of rectal bleeding    remote past->? rectal ulcer vs perf'd divertic-->need old records for clarif.   Hypercholesterolemia  Past hx of statin use, then was able to come off meds when she lost wt   Hypertension    Insomnia    Obesity, Class I, BMI 30-34.9    Palpitations    Pyelonephritis    hosp 03/2022   Right hip pain    MRI showed labral tear, got steroid injection. Also troch bursitis.   Right shoulder pain 05/2020   RC tear, bicipital  pathology, AC arthrosis and impingement-->to have surgery    Patient's surgical history, family medical history, social history, medications and allergies were all reviewed in Epic    Current Outpatient Medications  Medication Sig Dispense Refill   atorvastatin (LIPITOR) 20 MG tablet TAKE 1 TABLET BY MOUTH DAILY 90 tablet 0   cholecalciferol (VITAMIN D) 25 MCG (1000 UT) tablet Take 1,000 Units by mouth daily.     gabapentin (NEURONTIN) 300 MG capsule Take 300 mg by mouth every other day.     levocetirizine (XYZAL) 5 MG tablet Take 5 mg by mouth daily.      lisinopril (ZESTRIL) 10 MG tablet TAKE 1 TABLET BY MOUTH  DAILY 90 tablet 0   meloxicam (MOBIC) 15 MG tablet Take 15 mg by mouth in the morning and at bedtime.     montelukast (SINGULAIR) 10 MG tablet TAKE 1 TABLET BY MOUTH AT  BEDTIME 90 tablet 0   Omega-3 Fatty Acids (OMEGA-3 FISH OIL PO) Take 1 capsule by mouth daily.     ondansetron (ZOFRAN-ODT) 4 MG disintegrating tablet Take 1 tablet (4 mg total) by mouth every 6 (six) hours as needed for nausea or vomiting. 30 tablet 3   senna (SENOKOT) 8.6 MG TABS tablet Take 1 tablet by mouth at bedtime.     traMADol (ULTRAM) 50 MG tablet Take 50 mg by mouth every 6 (six) hours as needed for moderate pain.     acetaminophen (TYLENOL) 500 MG tablet Take 1,000 mg by mouth every 6 (six) hours as needed for mild pain, fever or headache. (Patient not taking: Reported on 06/27/2022)     clindamycin (CLEOCIN) 2 % vaginal cream Place 1 Applicatorful vaginally at bedtime. (Patient not taking: Reported on 06/27/2022) 40 g 0   metroNIDAZOLE (FLAGYL) 500 MG tablet Take 1 tablet (500 mg total) by mouth 2 (two) times daily. (Patient not taking: Reported on 06/27/2022) 14 tablet 0   OZEMPIC, 2 MG/DOSE, 8 MG/3ML SOPN Inject 2 mg into the skin once a week. (Patient not taking: Reported on 06/27/2022) 9 mL 1   pantoprazole (PROTONIX) 40 MG tablet Take 1 tablet (40 mg total) by mouth 2 (two) times daily. (Patient not  taking: Reported on 06/27/2022) 60 tablet 1   simethicone (MYLICON) 80 MG chewable tablet Chew 1 tablet (80 mg total) by mouth every 6 (six) hours as needed for flatulence (gas, bloating, abdominal discomfort). (Patient not taking: Reported on 06/27/2022) 30 tablet 0   No current facility-administered medications for this visit.    Physical Exam:     BP 110/68   Pulse 90   Ht 4\' 11"  (1.499 m)   Wt 156 lb (70.8 kg)   SpO2 98%   BMI 31.51 kg/m   GENERAL:  Pleasant female in NAD PSYCH: : Cooperative, normal affect Musculoskeletal:  Normal muscle tone, normal strength NEURO: Alert and oriented x 3, no focal neurologic deficits   IMPRESSION and PLAN:    1) Dysphagia - Suspect intermittent episodes of dysphagia 2/2 esophagus that is still healing.  Symptoms have improved over the last  3 weeks with modified diet.  Conservative management most appropriate at this juncture - If symptoms recur, plan for esophagram  2) GERD 3) Hiatal hernia s/p hiatal hernia repair and fundoplication - Reflux symptoms have completely resolved after cTIF earlier this year    Lavena Bullion ,DO, FACG 06/27/2022, 10:03 AM

## 2022-06-27 NOTE — Patient Instructions (Signed)
If you are age 63 or younger, your body mass index should be between 19-25. Your Body mass index is 31.51 kg/m. If this is out of the aformentioned range listed, please consider follow up with your Primary Care Provider.   __________________________________________________________  The [facility] providers would like to encourage you to use [facility] to communicate with providers for non-urgent requests or questions.  Due to long hold times on the telephone, sending your provider a message by [facility] may be a faster and more efficient way to get a response.  Please allow 48 business hours for a response.  Please remember that this is for non-urgent requests.   Due to recent changes in healthcare laws, you may see the results of your imaging and laboratory studies on MyChart before your provider has had a chance to review them.  We understand that in some cases there may be results that are confusing or concerning to you. Not all laboratory results come back in the same time frame and the provider may be waiting for multiple results in order to interpret others.  Please give [place] 48 hours in order for your provider to thoroughly review all the results before contacting the office for clarification of your results.    Follow up as needed.   Thank you for choosing me and [facility].  Vito Cirigliano, D.O.

## 2022-07-18 ENCOUNTER — Encounter: Payer: Self-pay | Admitting: Family Medicine

## 2022-07-30 ENCOUNTER — Encounter: Payer: Self-pay | Admitting: Family Medicine

## 2022-07-30 NOTE — Telephone Encounter (Signed)
Noted  

## 2022-07-30 NOTE — Telephone Encounter (Signed)
Please dispense her a sample of the 2 mg weekly dose.

## 2022-07-30 NOTE — Telephone Encounter (Signed)
Patient will pick up today 4pm.

## 2022-08-03 ENCOUNTER — Other Ambulatory Visit: Payer: Self-pay | Admitting: Family Medicine

## 2022-09-04 ENCOUNTER — Other Ambulatory Visit: Payer: Self-pay | Admitting: Family Medicine

## 2022-09-11 ENCOUNTER — Encounter: Payer: Self-pay | Admitting: Family Medicine

## 2022-09-11 ENCOUNTER — Ambulatory Visit (INDEPENDENT_AMBULATORY_CARE_PROVIDER_SITE_OTHER): Payer: 59 | Admitting: Family Medicine

## 2022-09-11 VITALS — BP 110/70 | HR 82 | Temp 98.2°F | Ht 60.0 in | Wt 156.8 lb

## 2022-09-11 DIAGNOSIS — E7800 Pure hypercholesterolemia, unspecified: Secondary | ICD-10-CM | POA: Diagnosis not present

## 2022-09-11 DIAGNOSIS — Z1211 Encounter for screening for malignant neoplasm of colon: Secondary | ICD-10-CM | POA: Diagnosis not present

## 2022-09-11 DIAGNOSIS — Z23 Encounter for immunization: Secondary | ICD-10-CM | POA: Diagnosis not present

## 2022-09-11 DIAGNOSIS — Z0000 Encounter for general adult medical examination without abnormal findings: Secondary | ICD-10-CM | POA: Diagnosis not present

## 2022-09-11 DIAGNOSIS — E559 Vitamin D deficiency, unspecified: Secondary | ICD-10-CM | POA: Diagnosis not present

## 2022-09-11 DIAGNOSIS — E119 Type 2 diabetes mellitus without complications: Secondary | ICD-10-CM | POA: Diagnosis not present

## 2022-09-11 LAB — COMPREHENSIVE METABOLIC PANEL
ALT: 26 U/L (ref 0–35)
AST: 22 U/L (ref 0–37)
Albumin: 4.5 g/dL (ref 3.5–5.2)
Alkaline Phosphatase: 84 U/L (ref 39–117)
BUN: 12 mg/dL (ref 6–23)
CO2: 28 mEq/L (ref 19–32)
Calcium: 9.5 mg/dL (ref 8.4–10.5)
Chloride: 104 mEq/L (ref 96–112)
Creatinine, Ser: 1.04 mg/dL (ref 0.40–1.20)
GFR: 57.3 mL/min — ABNORMAL LOW (ref >60.00)
Glucose, Bld: 89 mg/dL (ref 70–99)
Potassium: 4.5 mEq/L (ref 3.5–5.1)
Sodium: 140 mEq/L (ref 135–145)
Total Bilirubin: 0.4 mg/dL (ref 0.2–1.2)
Total Protein: 7.2 g/dL (ref 6.0–8.3)

## 2022-09-11 LAB — CBC
HCT: 39.9 % (ref 36.0–46.0)
Hemoglobin: 12.8 g/dL (ref 12.0–15.0)
MCHC: 32.1 g/dL (ref 30.0–36.0)
MCV: 83.1 fl (ref 78.0–100.0)
Platelets: 306 10*3/uL (ref 150.0–400.0)
RBC: 4.8 Mil/uL (ref 3.87–5.11)
RDW: 15 % (ref 11.5–15.5)
WBC: 7.7 10*3/uL (ref 4.0–10.5)

## 2022-09-11 LAB — POCT GLYCOSYLATED HEMOGLOBIN (HGB A1C)
HbA1c POC (<> result, manual entry): 5.6 % (ref 4.0–5.6)
HbA1c, POC (controlled diabetic range): 5.6 % (ref 0.0–7.0)
HbA1c, POC (prediabetic range): 5.6 % — AB (ref 5.7–6.4)
Hemoglobin A1C: 5.6 % (ref 4.0–5.6)

## 2022-09-11 LAB — LIPID PANEL
Cholesterol: 126 mg/dL (ref 0–200)
HDL: 56.2 mg/dL (ref >39.00)
LDL Cholesterol: 52 mg/dL (ref 0–99)
NonHDL: 69.52
Total CHOL/HDL Ratio: 2
Triglycerides: 89 mg/dL (ref 0.0–149.0)
VLDL: 17.8 mg/dL (ref 0.0–40.0)

## 2022-09-11 LAB — TSH: TSH: 2.12 u[IU]/mL (ref 0.35–5.50)

## 2022-09-11 LAB — VITAMIN D 25 HYDROXY (VIT D DEFICIENCY, FRACTURES): VITD: 49.56 ng/mL (ref 30.00–100.00)

## 2022-09-11 MED ORDER — OZEMPIC (2 MG/DOSE) 8 MG/3ML ~~LOC~~ SOPN: 2.0000 mg | PEN_INJECTOR | SUBCUTANEOUS | Status: DC

## 2022-09-11 NOTE — Progress Notes (Signed)
Office Note 09/11/2022  HPI:  Patient is a 61 y.o. female who is here for annual health maintenance exam and follow-up diabetes. She has had great results with Ozempic, currently on the 2 mg weekly dose.  Past Medical History:  Diagnosis Date   Allergic rhinitis    Anemia    Arthralgia of multiple sites    Gen rheum lab w/u normal/neg 11/2016 by prior PCP   Constipation    DDD (degenerative disc disease), lumbar 11/2020   R glut/hip pain->ortho, MRI->disc causing pinched nerve at L3-4 level on R, signif dz on L at L4-5 and L5-S1--->ESI on R 11/2020 very helpful, +PT.   Diabetes mellitus ([facility]) 2022   Prediab 2017 --Old PCP records state only that her Hba1c was 6.1% in 2017: she took metformin x ? 6-12 mo, rx'd by wt loss MD.  A1c 6.1% Nov 2019. A1c 6.2% 12/2019. A1c 6.8% 12/2020.   Diverticulosis    Fatty liver    Noted on CT done during her hospitalization for pyelo 03/2022   GERD (gastroesophageal reflux disease)    +grade A esophagitis 06/2019 EGD.  Bx neg for eosinophilic esoph. 08/2019 esoph manometry normal, pH probe + abnl reflux not fully suppressed by PPI. PPI BID + H2 blocker as of 08/2019.   History of rectal bleeding    remote past->? rectal ulcer vs perf'd divertic-->need old records for clarif.   History of recurrent UTIs    urol 2023   Hypercholesterolemia    Past hx of statin use, then was able to come off meds when she lost wt   Hypertension    Insomnia    Obesity, Class I, BMI 30-34.9    Palpitations    Pyelonephritis    hosp 03/2022   Right hip pain    MRI showed labral tear, got steroid injection. Also troch bursitis.   Right shoulder pain 05/2020   RC tear, bicipital pathology, AC arthrosis and impingement-->to have surgery    Past Surgical History:  Procedure Laterality Date   14 HOUR PH STUDY N/A 08/05/2019   +GERD. Procedure: 24 HOUR PH STUDY;  Surgeon: Tressia Danas, MD;  Location: [facility];  Service: Gastroenterology;  Laterality: N/A;    ABDOMINAL HYSTERECTOMY  2005   for benign dx (No BSO)   BREAST BIOPSY Right 2005   CESAREAN SECTION     x 2   CHOLECYSTECTOMY  2011   COLONOSCOPY  most recent 01/2012   x 2->approx 2011 was done for BRBPR, unclear dx (rectal ulcer? perf'd diverticulum?).  Another about 2015 for abd pain w/u?  No polyps detected on either colonoscopy.  Need old records for clarification (GI MD was Dr. Thedore M[place] in [place])--prior pcp records say "colonoscopy 01/2012 negative".   ESOPHAGEAL MANOMETRY N/A 08/05/2019   NORMAL Procedure: ESOPHAGEAL MANOMETRY (EM);  Surgeon: Tressia Danas, MD;  Location: [facility];  Service: Gastroenterology;  Laterality: N/A;   ESOPHAGOGASTRODUODENOSCOPY  06/2019     06/2019->reflux esophagitis, o/w normal.   ESOPHAGOGASTRODUODENOSCOPY N/A 02/27/2022   Procedure: ESOPHAGOGASTRODUODENOSCOPY (EGD);  Surgeon: Shellia Cleverly, DO;  Location: [facility];  Service: Gastroenterology;  Laterality: N/A;   HIATAL HERNIA REPAIR N/A 02/27/2022   Procedure: LAPAROSCOPIC REPAIR OF HIATAL HERNIA;  Surgeon: Gaynelle Adu, MD;  Location: [facility];  Service: General;  Laterality: N/A;   ROTATOR CUFF REPAIR Right 06/2021   TONSILLECTOMY AND ADENOIDECTOMY  2009   TRANSORAL INCISIONLESS FUNDOPLICATION N/A 02/27/2022   Procedure: TRANSORAL INCISIONLESS FUNDOPLICATION;  Surgeon: Shellia Cleverly, DO;  Location:  [facility];  Service: Gastroenterology;  Laterality: N/A;    Family History  Problem Relation Age of Onset   Diabetes Mother    Alcohol abuse Mother    High blood pressure Mother    Rheumatic fever Father    Heart attack Father    Diabetes Brother    Breast cancer Maternal Aunt    Colon cancer Neg Hx    Esophageal cancer Neg Hx    Rectal cancer Neg Hx    Stomach cancer Neg Hx     Social History   Socioeconomic History   Marital status: Married    Spouse name: Elmyra Ricks   Number of children: 2   Years of education: Not on file   Highest education level: Bachelor's degree (e.g., BA, AB, BS)   Occupational History   Occupation: Conservation officer, historic buildings, Chartered loss adjuster (travel required)  Tobacco Use   Smoking status: Never   Smokeless tobacco: Never  Vaping Use   Vaping Use: Never used  Substance and Sexual Activity   Alcohol use: Not Currently    Comment: wine occasional   Drug use: Never   Sexual activity: Not on file  Other Topics Concern   Not on file  Social History Narrative   Married, 2 daughters.   Moved to [facility] from [facility] 2019 to be closer to daughter and grandchildren.   Educ: College in [place].     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   In [place], she works from home for [employer].   Social Determinants of Health   Financial Resource Strain: Low Risk  (11/13/2021)   Overall Financial Resource Strain (CARDIA)    Difficulty of Paying Living Expenses: Not hard at all  Food Insecurity: No Food Insecurity (11/13/2021)   Hunger Vital Sign    Worried About Running Out of Food in the Last Year: Never true    Ran Out of Food in the Last Year: Never true  Transportation Needs: No Transportation Needs (11/13/2021)   PRAPARE - Administrator, Civil Service (Medical): No    Lack of Transportation (Non-Medical): No  Physical Activity: Insufficiently Active (11/13/2021)   Exercise Vital Sign    Days of Exercise per Week: 2 days    Minutes of Exercise per Session: 40 min  Stress: Stress Concern Present (11/13/2021)   [employer] of Occupational Health - Occupational Stress Questionnaire    Feeling of Stress : To some extent  Social Connections: Moderately Isolated (11/13/2021)   Social Connection and Isolation Panel [NHANES]    Frequency of Communication with Friends and Family: More than three times a week    Frequency of Social Gatherings with Friends and Family: Once a week    Attends Religious Services: Never    Database administrator or Organizations: No    Attends Engineer, structural: Not on file    Marital Status: Married   Catering manager Violence: Not on file    Outpatient Medications Prior to Visit  Medication Sig Dispense Refill   atorvastatin (LIPITOR) 20 MG tablet TAKE 1 TABLET BY MOUTH ONCE  DAILY 90 tablet 1   cholecalciferol (VITAMIN D) 25 MCG (1000 UT) tablet Take 1,000 Units by mouth daily.     gabapentin (NEURONTIN) 300 MG capsule Take 300 mg by mouth every other day.     levocetirizine (XYZAL) 5 MG tablet Take 5 mg by mouth daily.      lisinopril (ZESTRIL) 10 MG tablet TAKE 1 TABLET BY  MOUTH DAILY 90 tablet 1   meloxicam (MOBIC) 15 MG tablet Take 15 mg by mouth in the morning and at bedtime.     montelukast (SINGULAIR) 10 MG tablet TAKE 1 TABLET BY MOUTH AT  BEDTIME 90 tablet 1   Omega-3 Fatty Acids (OMEGA-3 FISH OIL PO) Take 1 capsule by mouth daily.     OZEMPIC, 2 MG/DOSE, 8 MG/3ML SOPN Inject 2 mg into the skin once a week. 9 mL 1   senna (SENOKOT) 8.6 MG TABS tablet Take 1 tablet by mouth at bedtime.     acetaminophen (TYLENOL) 500 MG tablet Take 1,000 mg by mouth every 6 (six) hours as needed for mild pain, fever or headache. (Patient not taking: Reported on 06/27/2022)     clindamycin (CLEOCIN) 2 % vaginal cream Place 1 Applicatorful vaginally at bedtime. (Patient not taking: Reported on 06/27/2022) 40 g 0   metroNIDAZOLE (FLAGYL) 500 MG tablet Take 1 tablet (500 mg total) by mouth 2 (two) times daily. (Patient not taking: Reported on 06/27/2022) 14 tablet 0   ondansetron (ZOFRAN-ODT) 4 MG disintegrating tablet Take 1 tablet (4 mg total) by mouth every 6 (six) hours as needed for nausea or vomiting. (Patient not taking: Reported on 09/11/2022) 30 tablet 3   pantoprazole (PROTONIX) 40 MG tablet Take 1 tablet (40 mg total) by mouth 2 (two) times daily. (Patient not taking: Reported on 06/27/2022) 60 tablet 1   simethicone (MYLICON) 80 MG chewable tablet Chew 1 tablet (80 mg total) by mouth every 6 (six) hours as needed for flatulence (gas, bloating, abdominal discomfort). (Patient not taking: Reported  on 06/27/2022) 30 tablet 0   traMADol (ULTRAM) 50 MG tablet Take 50 mg by mouth every 6 (six) hours as needed for moderate pain. (Patient not taking: Reported on 09/11/2022)     No facility-administered medications prior to visit.    Allergies  Allergen Reactions   Codeine Anaphylaxis    ROS Review of Systems  Constitutional:  Negative for appetite change, chills, fatigue and fever.  HENT:  Negative for congestion, dental problem, ear pain and sore throat.   Eyes:  Negative for discharge, redness and visual disturbance.  Respiratory:  Negative for cough, chest tightness, shortness of breath and wheezing.   Cardiovascular:  Negative for chest pain, palpitations and leg swelling.  Gastrointestinal:  Negative for abdominal pain, blood in stool, diarrhea, nausea and vomiting.  Genitourinary:  Negative for difficulty urinating, dysuria, flank pain, frequency, hematuria and urgency.  Musculoskeletal:  Negative for arthralgias, back pain, joint swelling, myalgias and neck stiffness.  Skin:  Negative for pallor and rash.  Neurological:  Negative for dizziness, speech difficulty, weakness and headaches.  Hematological:  Negative for adenopathy. Does not bruise/bleed easily.  Psychiatric/Behavioral:  Negative for confusion and sleep disturbance. The patient is not nervous/anxious.     PE;    09/11/2022    9:44 AM 06/27/2022    9:50 AM 06/25/2022   10:42 AM  Vitals with BMI  Height 5\' 0"  4\' 11"    Weight 156 lbs 13 oz 156 lbs   BMI 30.62 31.49   Systolic 110 110  Diastolic 70 68 77  Pulse 82 90 76   Exam chaperoned by , CMA. Gen: Alert, well appearing.  Patient is oriented to person, place, time, and situation. AFFECT: pleasant, lucid thought and speech. ENT: Ears: EACs clear, normal epithelium.  TMs with good light reflex and landmarks bilaterally.  Eyes: no injection, icteris, swelling, or exudate.  EOMI, PERRLA.  Nose: no drainage or turbinate edema/swelling.  No  injection or focal lesion.  Mouth: lips without lesion/swelling.  Oral mucosa pink and moist.  Dentition intact and without obvious caries or gingival swelling.  Oropharynx without erythema, exudate, or swelling.  Neck: supple/nontender.  No LAD, mass, or TM.  She has a 1 cm rubbery and mobile lymph node palpable at the right posterolateral neck.  Carotid pulses 2+ bilaterally, without bruits. CV: RRR, no m/r/g.   LUNGS: CTA bilat, nonlabored resps, good aeration in all lung fields. ABD: soft, NT, ND, BS normal.  No hepatospenomegaly or mass.  No bruits. EXT: no clubbing, cyanosis, or edema.  Musculoskeletal: no joint swelling, erythema, warmth, or tenderness.  ROM of all joints intact. Skin - no sores or suspicious lesions or rashes or color changes   Pertinent labs:  Lab Results  Component Value Date   TSH 2.04 01/03/2021   Lab Results  Component Value Date   WBC 7.7 04/04/2022   HGB 12.3 04/04/2022   HCT 39.1 04/04/2022   MCV 83.8 04/04/2022   PLT 307.0 04/04/2022   Lab Results  Component Value Date   CREATININE 1.05 06/12/2022   BUN 12 06/12/2022   NA 141 06/12/2022   K 4.7 06/12/2022   CL 105 06/12/2022   CO2 26 06/12/2022   Lab Results  Component Value Date   ALT 44 (H) 06/12/2022   AST 41 (H) 06/12/2022   ALKPHOS 80 06/12/2022   BILITOT 0.5 06/12/2022   Lab Results  Component Value Date   CHOL 133 06/12/2022   Lab Results  Component Value Date   HDL 59.20 06/12/2022   Lab Results  Component Value Date   LDLCALC 61 06/12/2022   Lab Results  Component Value Date   TRIG 66.0 06/12/2022   Lab Results  Component Value Date   CHOLHDL 2 06/12/2022   Lab Results  Component Value Date   HGBA1C 5.8 (A) 06/12/2022   HGBA1C 5.8 06/12/2022   HGBA1C 5.8 06/12/2022   HGBA1C 5.8 06/12/2022   Last vitamin D Lab Results  Component Value Date   VD25OH 22.9 (L) 01/04/2020   Lab Results  Component Value Date   VITAMINB12 438 01/04/2020   ASSESSMENT AND  PLAN:   #1 health maintenance exam: Reviewed age and gender appropriate health maintenance issues (prudent diet, regular exercise, health risks of tobacco and excessive alcohol, use of seatbelts, fire alarms in home, use of sunscreen).  Also reviewed age and gender appropriate health screening as well as vaccine recommendations. Vaccines: prevnar 20->given today.  Tdap->given today. otherwise up-to-date. Labs: Health panel, A1c, vitamin D level Cervical ca screening: n/a--> history of hysterectomy for benign diagnosis. Breast ca screening: Mammogram due February 2024 Colon ca screening: [place]-->GI referral today.  #2 diabetes without complication. Point-of-care A1c is 5.6% today. Continue Ozempic 2 mg subcu weekly.  3.  Hypertension, well controlled on lisinopril 10 mg a day. Electrolytes and creatinine today.  An After Visit Summary was printed and given to the patient.  FOLLOW UP:  Return in about 6 months (around 03/13/2023) for routine chronic illness f/u.  Signed:  Santiago BumpersPhil Lelani Garnett, MD           09/11/2022

## 2022-09-11 NOTE — Patient Instructions (Signed)

## 2022-10-02 ENCOUNTER — Other Ambulatory Visit: Payer: Self-pay | Admitting: Family Medicine

## 2022-10-02 DIAGNOSIS — Z1231 Encounter for screening mammogram for malignant neoplasm of breast: Secondary | ICD-10-CM

## 2022-10-23 ENCOUNTER — Encounter: Payer: Self-pay | Admitting: Family Medicine

## 2022-10-23 NOTE — Telephone Encounter (Signed)
Noted  

## 2022-11-14 ENCOUNTER — Ambulatory Visit (INDEPENDENT_AMBULATORY_CARE_PROVIDER_SITE_OTHER): Payer: 59

## 2022-11-14 DIAGNOSIS — Z1231 Encounter for screening mammogram for malignant neoplasm of breast: Secondary | ICD-10-CM

## 2023-01-23 ENCOUNTER — Other Ambulatory Visit: Payer: Self-pay | Admitting: Family Medicine

## 2023-02-12 ENCOUNTER — Other Ambulatory Visit: Payer: Self-pay | Admitting: Family Medicine

## 2023-03-08 NOTE — Patient Instructions (Signed)

## 2023-03-13 ENCOUNTER — Encounter: Payer: Self-pay | Admitting: Family Medicine

## 2023-03-13 ENCOUNTER — Ambulatory Visit (INDEPENDENT_AMBULATORY_CARE_PROVIDER_SITE_OTHER): Payer: 59 | Admitting: Family Medicine

## 2023-03-13 VITALS — BP 127/81 | HR 77 | Ht 60.0 in | Wt 156.2 lb

## 2023-03-13 DIAGNOSIS — Z7985 Long-term (current) use of injectable non-insulin antidiabetic drugs: Secondary | ICD-10-CM | POA: Diagnosis not present

## 2023-03-13 DIAGNOSIS — E7800 Pure hypercholesterolemia, unspecified: Secondary | ICD-10-CM

## 2023-03-13 DIAGNOSIS — I10 Essential (primary) hypertension: Secondary | ICD-10-CM | POA: Diagnosis not present

## 2023-03-13 DIAGNOSIS — E119 Type 2 diabetes mellitus without complications: Secondary | ICD-10-CM

## 2023-03-13 DIAGNOSIS — M5136 Other intervertebral disc degeneration, lumbar region: Secondary | ICD-10-CM

## 2023-03-13 LAB — POCT GLYCOSYLATED HEMOGLOBIN (HGB A1C)
HbA1c POC (<> result, manual entry): 5.5 % (ref 4.0–5.6)
HbA1c, POC (controlled diabetic range): 5.5 % (ref 0.0–7.0)
HbA1c, POC (prediabetic range): 5.5 % — AB (ref 5.7–6.4)
Hemoglobin A1C: 5.5 % (ref 4.0–5.6)

## 2023-03-13 MED ORDER — METHYLPREDNISOLONE 4 MG PO TBPK: ORAL_TABLET | ORAL | 1 refills | Status: AC

## 2023-03-13 NOTE — Progress Notes (Signed)
OFFICE VISIT  03/13/2023  CC:  Chief Complaint  Patient presents with   Medical Management of Chronic Issues    Pt is fasting    Patient is a 62 y.o. female who presents for 28-month follow-up diabetes, hypertension, and hypercholesterolemia. All was well-controlled at last follow-up visit, no changes made.  INTERIM HX: Martha [name] feels well.  Neck She is getting some physical therapy for her back and says this is helping. She checks her sugar at home and it is consistently in normal range.  No problems with Ozempic 2 mg every week.  ROS as above, plus--> no fevers, no CP, no SOB, no wheezing, no cough, no dizziness, no HAs, no rashes, no melena/hematochezia.  No polyuria or polydipsia.  No myalgias or arthralgias.  No focal weakness, paresthesias, or tremors.  No acute vision or hearing abnormalities.  No dysuria or unusual/new urinary urgency or frequency.  No recent changes in lower legs. No n/v/d or abd pain.  No palpitations.    Past Medical History:  Diagnosis Date   Allergic rhinitis    Anemia    Arthralgia of multiple sites    Gen rheum lab w/u normal/neg 11/2016 by prior PCP   Constipation    DDD (degenerative disc disease), lumbar 11/2020   R glut/hip pain->ortho, MRI->disc causing pinched nerve at L3-4 level on R, signif dz on L at L4-5 and L5-S1--->ESI on R 11/2020 very helpful, +PT.   Diabetes mellitus (HCC) 2022   Prediab 2017 --Old PCP records state only that her Hba1c was 6.1% in 2017: she took metformin x ? 6-12 mo, rx'd by wt loss MD.  A1c 6.1% Nov 2019. A1c 6.2% 12/2019. A1c 6.8% 12/2020.   Diverticulosis    Fatty liver    Noted on CT done during her hospitalization for pyelo 03/2022   GERD (gastroesophageal reflux disease)    +grade A esophagitis 06/2019 EGD.  Bx neg for eosinophilic esoph. 08/2019 esoph manometry normal, pH probe + abnl reflux not fully suppressed by PPI. PPI BID + H2 blocker as of 08/2019.   History of rectal bleeding    remote past->? rectal ulcer vs  perf'd divertic-->need old records for clarif.   History of recurrent UTIs    urol 2023   Hypercholesterolemia    Past hx of statin use, then was able to come off meds when she lost wt   Hypertension    Insomnia    Obesity, Class I, BMI 30-34.9    Palpitations    Pyelonephritis    hosp 03/2022   Right hip pain    MRI showed labral tear, got steroid injection. Also troch bursitis.   Right shoulder pain 05/2020   RC tear, bicipital pathology, AC arthrosis and impingement-->to have surgery    Past Surgical History:  Procedure Laterality Date   36 HOUR PH STUDY N/A 08/05/2019   +GERD. Procedure: 24 HOUR PH STUDY;  Surgeon: Tressia Danas, MD;  Location: [facility];  Service: Gastroenterology;  Laterality: N/A;   ABDOMINAL HYSTERECTOMY  2005   for benign dx (No BSO)   BREAST BIOPSY Right 2005   CESAREAN SECTION     x 2   CHOLECYSTECTOMY  2011   COLONOSCOPY  most recent 01/2012   x 2->approx 2011 was done for BRBPR, unclear dx (rectal ulcer? perf'd diverticulum?).  Another about 2015 for abd pain w/u?  No polyps detected on either colonoscopy.  Need old records for clarification (GI MD was Dr. Thedore Mins in [place])--prior pcp records say "colonoscopy  01/2012 negative".   ESOPHAGEAL MANOMETRY N/A 08/05/2019   NORMAL Procedure: ESOPHAGEAL MANOMETRY (EM);  Surgeon: Tressia Danas, MD;  Location: [facility];  Service: Gastroenterology;  Laterality: N/A;   ESOPHAGOGASTRODUODENOSCOPY  06/2019     06/2019->reflux esophagitis, o/w normal.   ESOPHAGOGASTRODUODENOSCOPY N/A 02/27/2022   Procedure: ESOPHAGOGASTRODUODENOSCOPY (EGD);  Surgeon: Shellia Cleverly, DO;  Location: [facility];  Service: Gastroenterology;  Laterality: N/A;   HIATAL HERNIA REPAIR N/A 02/27/2022   Procedure: LAPAROSCOPIC REPAIR OF HIATAL HERNIA;  Surgeon: Gaynelle Adu, MD;  Location: [facility];  Service: General;  Laterality: N/A;   ROTATOR CUFF REPAIR Right 06/2021   TONSILLECTOMY AND ADENOIDECTOMY  2009   TRANSORAL INCISIONLESS  FUNDOPLICATION N/A 02/27/2022   Procedure: TRANSORAL INCISIONLESS FUNDOPLICATION;  Surgeon: Shellia Cleverly, DO;  Location: [facility];  Service: Gastroenterology;  Laterality: N/A;    Outpatient Medications Prior to Visit  Medication Sig Dispense Refill   acetaminophen (TYLENOL) 500 MG tablet Take 1,000 mg by mouth every 6 (six) hours as needed for mild pain, fever or headache.     atorvastatin (LIPITOR) 20 MG tablet TAKE 1 TABLET BY MOUTH ONCE  DAILY 90 tablet 2   cholecalciferol (VITAMIN D) 25 MCG (1000 UT) tablet Take 1,000 Units by mouth daily.     gabapentin (NEURONTIN) 300 MG capsule Take 300 mg by mouth every other day.     levocetirizine (XYZAL) 5 MG tablet Take 5 mg by mouth daily.      lisinopril (ZESTRIL) 10 MG tablet TAKE 1 TABLET BY MOUTH DAILY 90 tablet 2   meloxicam (MOBIC) 15 MG tablet Take 15 mg by mouth in the morning and at bedtime.     montelukast (SINGULAIR) 10 MG tablet TAKE 1 TABLET BY MOUTH AT  BEDTIME 90 tablet 2   Omega-3 Fatty Acids (OMEGA-3 FISH OIL PO) Take 1 capsule by mouth daily.     OZEMPIC, 2 MG/DOSE, 8 MG/3ML SOPN INJECT SUBCUTANEOUSLY 2 MG EVERY WEEK 9 mL 1   senna (SENOKOT) 8.6 MG TABS tablet Take 1 tablet by mouth at bedtime.     No facility-administered medications prior to visit.    Allergies  Allergen Reactions   Codeine Anaphylaxis    Review of Systems As per HPI  PE:    03/13/2023    9:39 AM 09/11/2022    9:44 AM 06/27/2022    9:50 AM  Vitals with BMI  Height 5\' 0"  5\' 0"  4\' 11"   Weight 156 lbs 3 oz 156 lbs 13 oz 156 lbs  BMI 30.51 30.62 31.49  Systolic 127 110 409  Diastolic 81 70 68  Pulse 77 82 90     Physical Exam  Gen: Alert, well appearing.  Patient is oriented to person, place, time, and situation. AFFECT: pleasant, lucid thought and speech. CV: RRR, no m/r/g.   LUNGS: CTA bilat, nonlabored resps, good aeration in all lung fields. EXT: no clubbing or cyanosis.  no edema.    LABS:  Last CBC Lab Results  Component  Value Date   WBC 7.7 09/11/2022   HGB 12.8 09/11/2022   HCT 39.9 09/11/2022   MCV 83.1 09/11/2022   MCH 26.6 03/18/2022   RDW 15.0 09/11/2022   PLT 306.0 09/11/2022   Last metabolic panel Lab Results  Component Value Date   GLUCOSE 89 09/11/2022   NA 140 09/11/2022   K 4.5 09/11/2022   CL 104 09/11/2022   CO2 28 09/11/2022   BUN 12 09/11/2022   CREATININE 1.04 09/11/2022  GFRNONAA >60 03/18/2022   CALCIUM 9.5 09/11/2022   PROT 7.2 09/11/2022   ALBUMIN 4.5 09/11/2022   LABGLOB 2.6 01/04/2020   AGRATIO 1.7 01/04/2020   BILITOT 0.4 09/11/2022   ALKPHOS 84 09/11/2022   AST 22 09/11/2022   ALT 26 09/11/2022   ANIONGAP 9 03/18/2022   Last lipids Lab Results  Component Value Date   CHOL 126 09/11/2022   HDL 56.20 09/11/2022   LDLCALC 52 09/11/2022   TRIG 89.0 09/11/2022   CHOLHDL 2 09/11/2022   Last hemoglobin A1c Lab Results  Component Value Date   HGBA1C 5.5 03/13/2023   HGBA1C 5.5 03/13/2023   HGBA1C 5.5 (A) 03/13/2023   HGBA1C 5.5 03/13/2023   Last thyroid functions Lab Results  Component Value Date   TSH 2.12 09/11/2022   T3TOTAL 130 01/04/2020   Last vitamin D Lab Results  Component Value Date   VD25OH 49.56 09/11/2022   Last vitamin B12 and Folate Lab Results  Component Value Date   VITAMINB12 438 01/04/2020   FOLATE 4.6 01/04/2020   IMPRESSION AND PLAN:  #1 diabetes without complication. Excellent control. POC Hba1c 5.5% today! Continue Ozempic 2 mg weekly. Checking electrolytes and creatinine today.  2. hypertension, well-controlled on lisinopril 10 mg a day. Electrolytes and creatinine today.  3.  Hypercholesterolemia, doing well on atorvastatin 20 mg a day. Lipid and hepatic panel today.  #4 degenerative disc disease lumbar spine.  Her orthopedist had recommended that she have a Medrol Dosepak to have with her just in case of an acute back pain flare when she travels long distances. She will be taking a long flight to [place] later  this summer.  I have sent in a prescription for Medrol Dosepak today, number 1 pack, refill x 1.  An After Visit Summary was printed and given to the patient.  FOLLOW UP: Return in about 6 months (around 09/12/2023) for annual CPE (fasting).  Signed:  Santiago Bumpers, MD           03/13/2023

## 2023-03-14 LAB — COMPREHENSIVE METABOLIC PANEL
ALT: 31 U/L (ref 0–35)
AST: 26 U/L (ref 0–37)
Albumin: 4.6 g/dL (ref 3.5–5.2)
Alkaline Phosphatase: 79 U/L (ref 39–117)
BUN: 10 mg/dL (ref 6–23)
CO2: 24 mEq/L (ref 19–32)
Calcium: 9.7 mg/dL (ref 8.4–10.5)
Chloride: 104 mEq/L (ref 96–112)
Creatinine, Ser: 1.06 mg/dL (ref 0.40–1.20)
GFR: 55.81 mL/min — ABNORMAL LOW (ref >60.00)
Glucose, Bld: 91 mg/dL (ref 70–99)
Potassium: 4.4 mEq/L (ref 3.5–5.1)
Sodium: 141 mEq/L (ref 135–145)
Total Bilirubin: 0.5 mg/dL (ref 0.2–1.2)
Total Protein: 7.4 g/dL (ref 6.0–8.3)

## 2023-03-14 LAB — LIPID PANEL
Cholesterol: 125 mg/dL (ref 0–200)
HDL: 58.6 mg/dL (ref >39.00)
LDL Cholesterol: 39 mg/dL (ref 0–99)
NonHDL: 66.39
Total CHOL/HDL Ratio: 2
Triglycerides: 136 mg/dL (ref 0.0–149.0)
VLDL: 27.2 mg/dL (ref 0.0–40.0)

## 2023-03-19 LAB — HM DIABETES EYE EXAM

## 2023-05-14 ENCOUNTER — Telehealth: Payer: Self-pay | Admitting: *Deleted

## 2023-05-14 NOTE — Telephone Encounter (Signed)
Left patient a message to call and schedule annual. 

## 2023-06-14 IMAGING — CT CT ABD-PELV W/ CM
2 of 5 series · 15 of 46 positions shown, 17 images · IV contrast (OMNIPAQUE 300)
Comparison: None Available.

CLINICAL DATA: Abdominal pain, postop nausea and vomiting

EXAM:
CT ABDOMEN AND PELVIS WITH CONTRAST
TECHNIQUE: Multidetector CT imaging of the abdomen and pelvis was performed
using the standard protocol following bolus administration of
intravenous contrast.

[Series 2: axial st · axial · 0.75mm/px · z∈[+1210,+1570]mm · 12 of 86 slices shown, 14 images]
[im 7/86  soft-tissue]
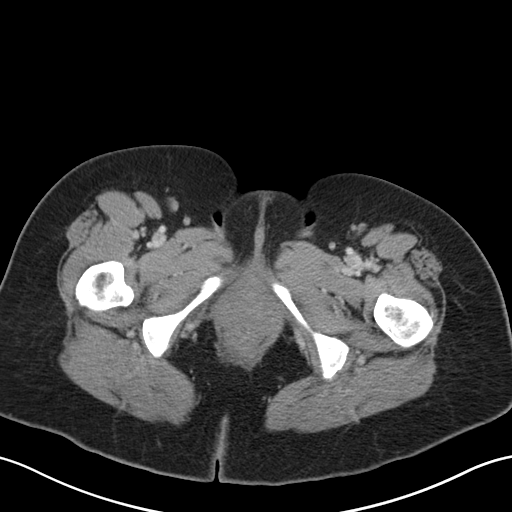
[im 7/86  bone]
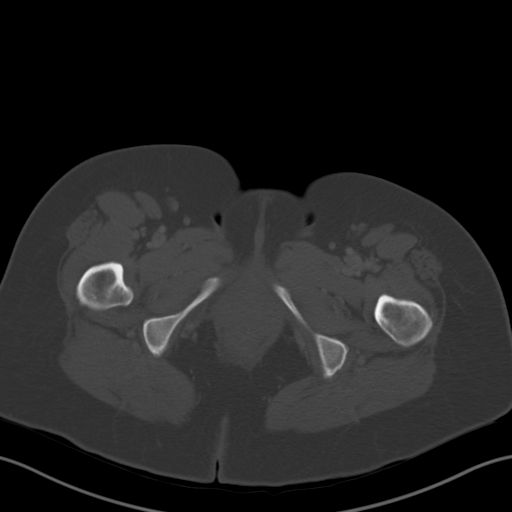
[im 14/86  soft-tissue]
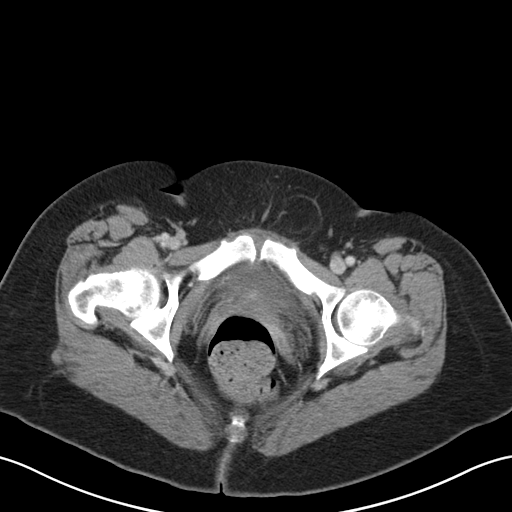
[im 20/86  soft-tissue]
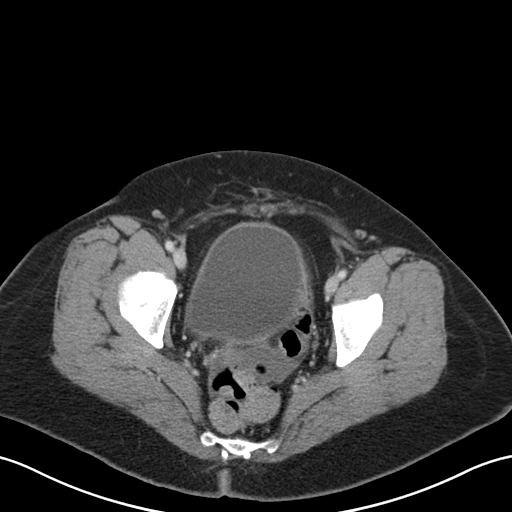
[im 27/86  soft-tissue]
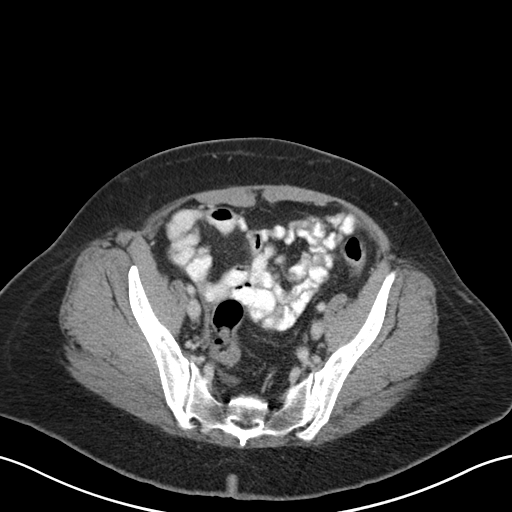
[im 33/86  soft-tissue]
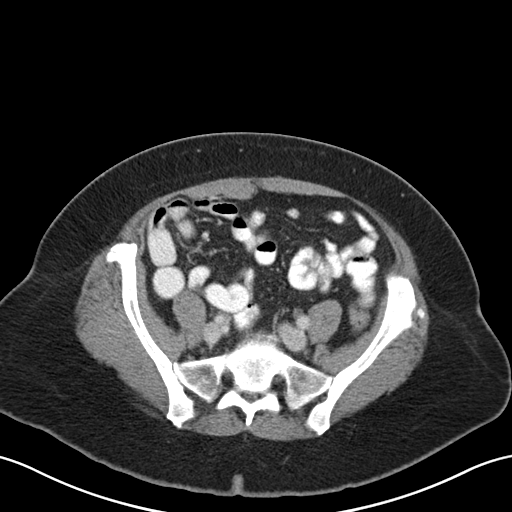
[im 40/86  soft-tissue]
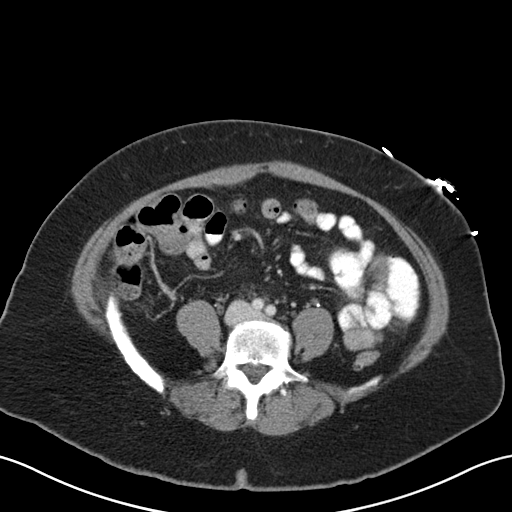
[im 46/86  soft-tissue]
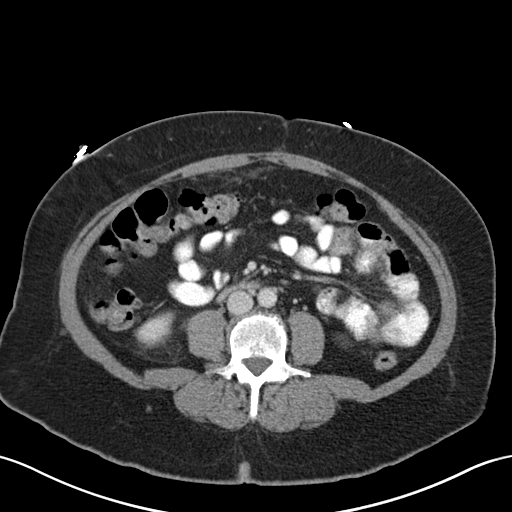
[im 53/86  soft-tissue]
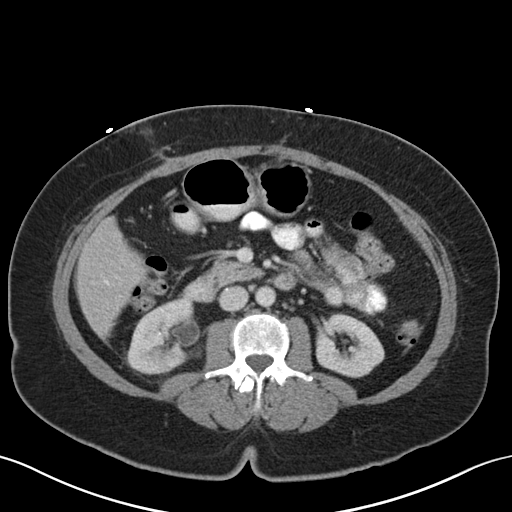
[im 59/86  soft-tissue]
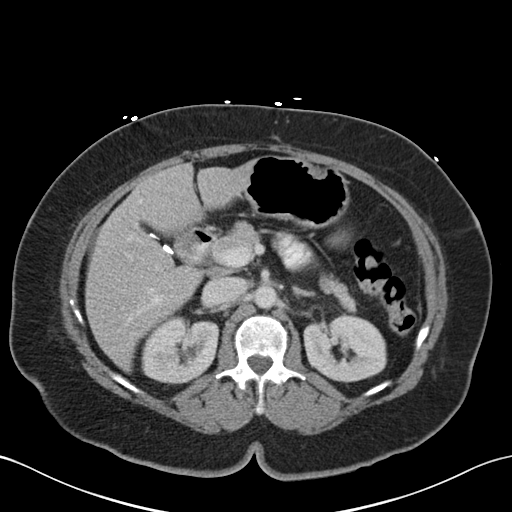
[im 59/86  bone]
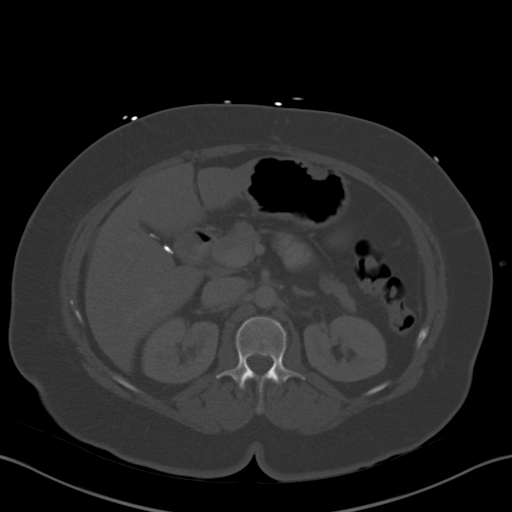
[im 66/86  soft-tissue]
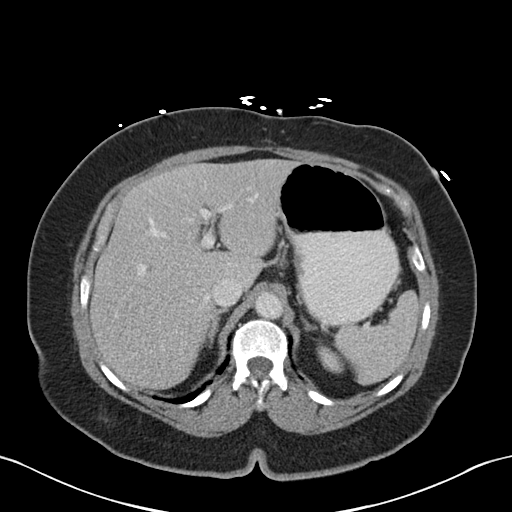
[im 72/86  soft-tissue]
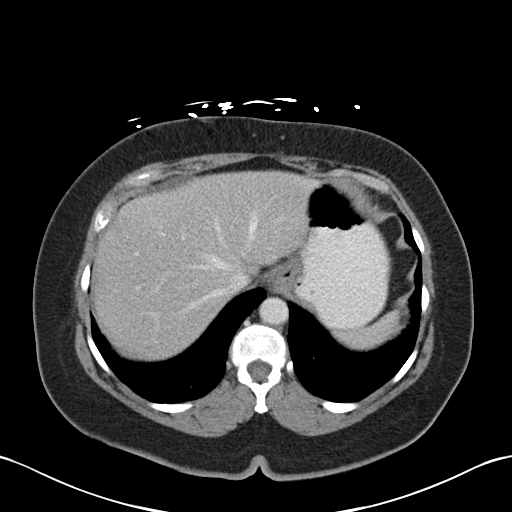
[im 79/86  soft-tissue]
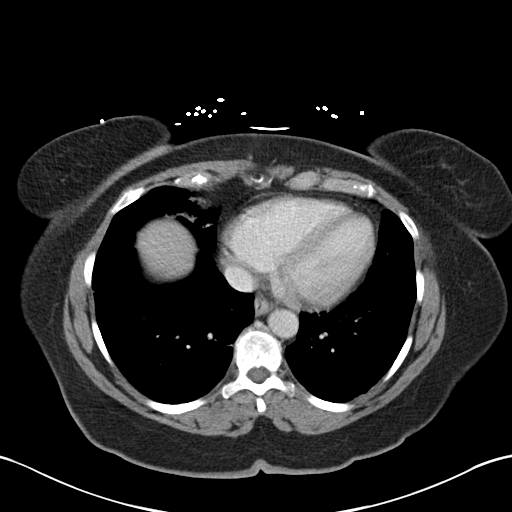

[Series 5: coronal st · coronal · 0.84mm/px · 3 of 116 slices shown]
[im 39/116  soft-tissue]
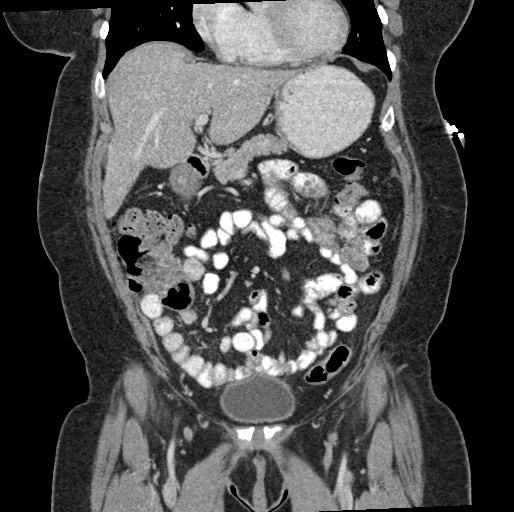
[im 52/116  soft-tissue]
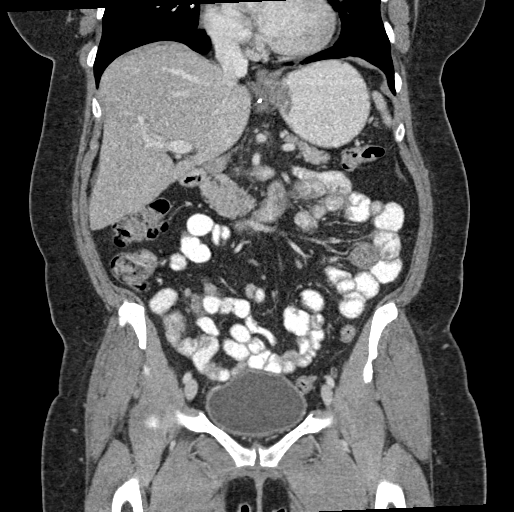
[im 64/116  soft-tissue]
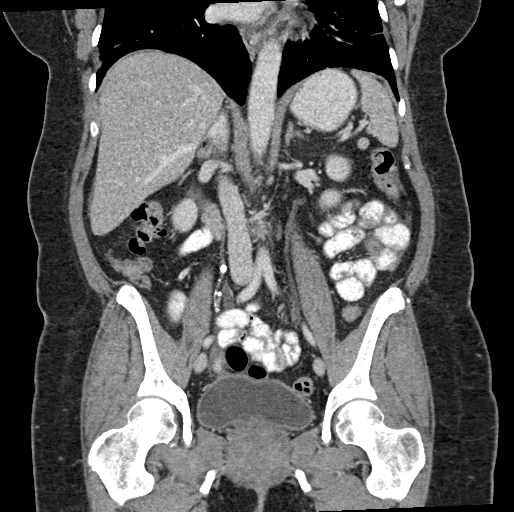

[15 of 46 positions shown; findings below may reference images not displayed]

RADIATION DOSE REDUCTION: This exam was performed according to the
departmental dose-optimization program which includes automated
exposure control, adjustment of the mA and/or kV according to
patient size and/or use of iterative reconstruction technique.

CONTRAST:  100mL OMNIPAQUE IOHEXOL 300 MG/ML  SOLN
FINDINGS: Lower chest: Bibasilar atelectasis.

Hepatobiliary: No focal liver abnormality is seen. Low-attenuation
of the liver as can be seen with hepatic steatosis. Prior
cholecystectomy.

Pancreas: Unremarkable. No pancreatic ductal dilatation or
surrounding inflammatory changes.

Spleen: Normal in size without focal abnormality.

Adrenals/Urinary Tract: Adrenal glands are unremarkable. Punctate
nonobstructing right renal calculus. No obstructive uropathy. Mild
urothelial enhancement of the renal pelvis and ureter, small focal
hypoenhancing area of the interpolar aspect, and right perinephric
stranding as can be seen with pyelonephritis. Correlate with
urinalysis. Bladder is unremarkable.

Stomach/Bowel: Stomach is within normal limits. No hiatal hernia. No
evidence of bowel wall thickening, distention, or inflammatory
changes. Appendix is normal. Sigmoid diverticulosis without evidence
of diverticulitis.

Vascular/Lymphatic: Aortic atherosclerosis. No enlarged abdominal or
pelvic lymph nodes.

Reproductive: Status post hysterectomy. No adnexal masses.

Other: No abdominal wall hernia or abnormality. Trace pelvic free
fluid.

Musculoskeletal: No acute osseous abnormality. No aggressive osseous
lesion. Degenerative disease with disc height loss L5-S1 with
bilateral foraminal stenosis.
IMPRESSION: 1. Mild urothelial enhancement of the renal pelvis and ureter, small
focal hypoenhancing area of the interpolar aspect, and right
perinephric stranding as can be seen with pyelonephritis. Correlate
with urinalysis.
2. Otherwise, no acute abdominal or pelvic pathology.
3. Sigmoid diverticulosis without evidence of diverticulitis.
4. Hepatic steatosis.
5. Aortic Atherosclerosis (NYYB1-IY2.2).

## 2023-06-14 IMAGING — DX DG CHEST 1V PORT
1 series · 1 of 1 positions shown · non-contrast
Comparison: None Available.

CLINICAL DATA: Fever, epigastric pain, recent surgery

EXAM:
PORTABLE CHEST 1 VIEW

[chest ap]
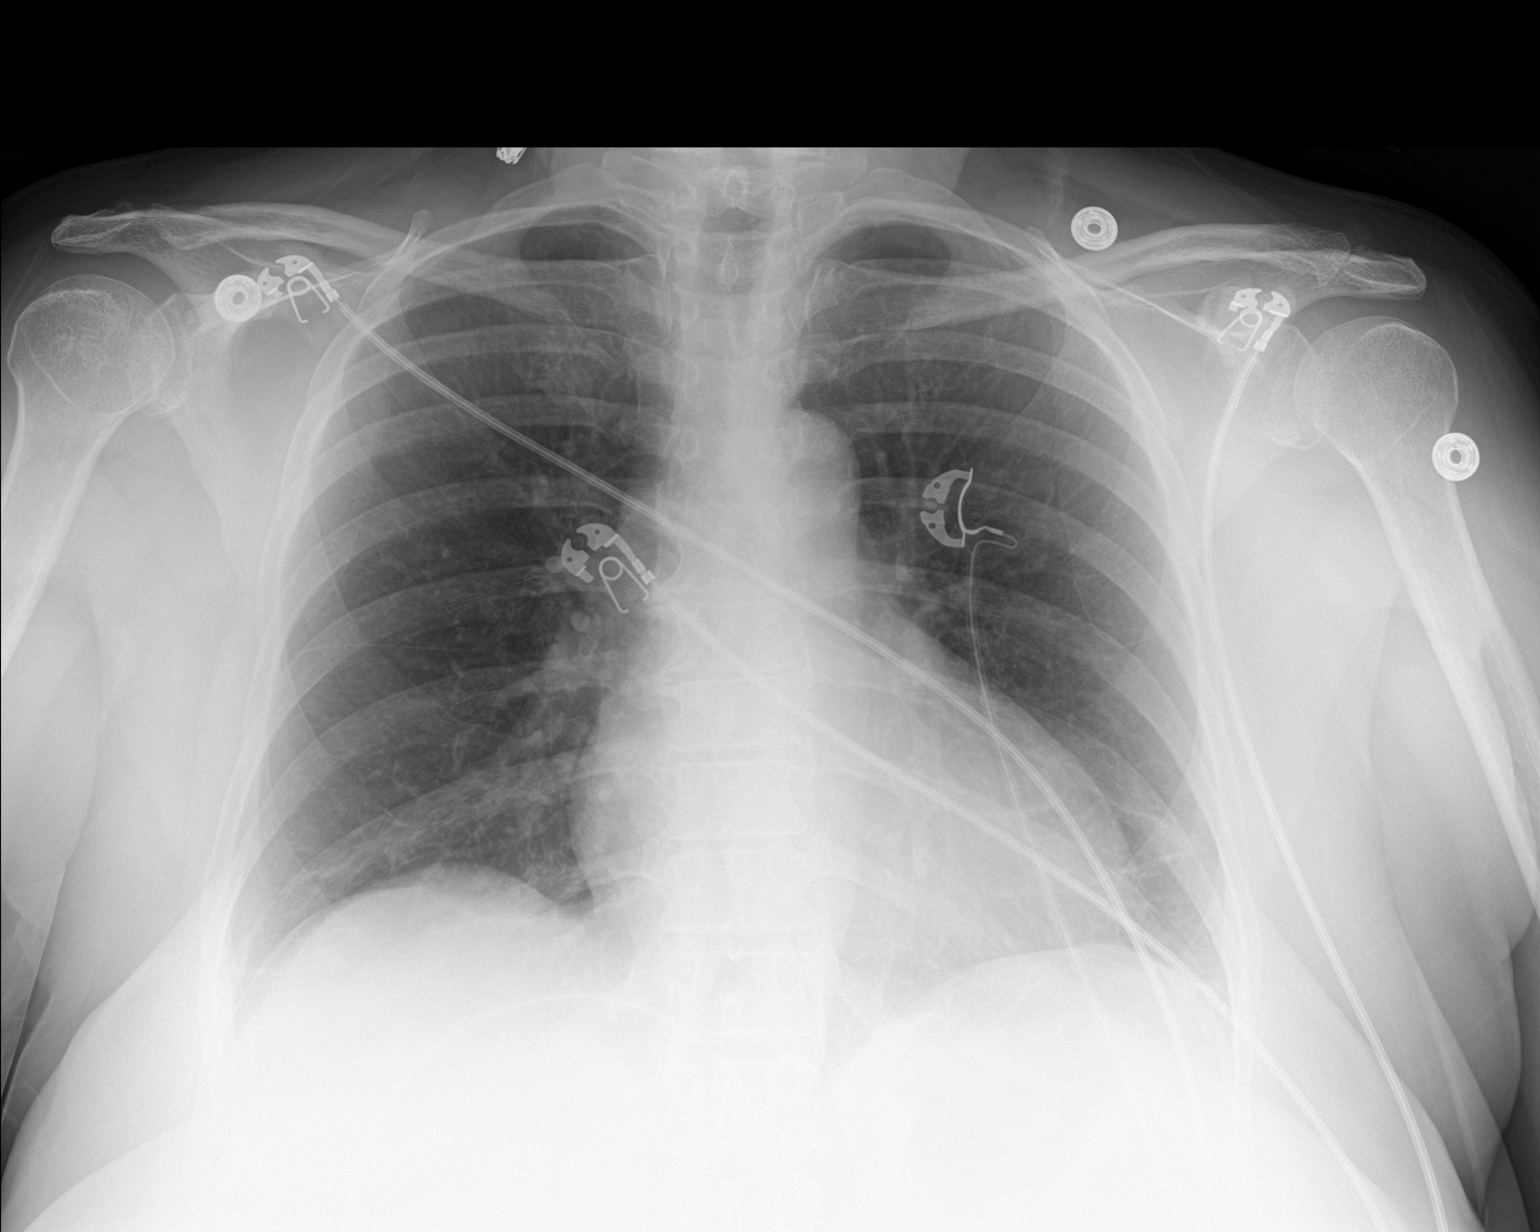

[1 of 1 positions shown; findings below may reference images not displayed]

FINDINGS: The heart size and mediastinal contours are within normal limits.
Both lungs are clear. The visualized skeletal structures are
unremarkable.
IMPRESSION: No acute abnormality of the lungs in AP portable projection.

## 2023-06-14 NOTE — Progress Notes (Unsigned)
  Subjective:     Martha [name] is a 62 y.o. female here for a routine exam.  Current complaints: mild UTI before recent trip to [place].  Sees urology.  Takes Macrobid before intercourse and estrogen twice a month. .     Gynecologic History No LMP recorded. Patient has had a hysterectomy. Contraception: status post hysterectomy Last Mammogram: 11/14/22- negative Last Pap Smear:  Hysterectomy Last Colon Screening;  2023 Seat Belts:   yes Sun Screen:   yes Dental Check Up:  yes Brush & Floss:  yes     Obstetric History OB History  Gravida Para Term Preterm AB Living  2 2          SAB IAB Ectopic Multiple Live Births          2    # Outcome Date GA Lbr Len/2nd Weight Sex Type Anes PTL Lv  2 Para      CS-LTranv     1 Para      CS-LTranv        The following portions of the patient's history were reviewed and updated as appropriate: allergies, current medications, past family history, past medical history, past social history, past surgical history, and problem list.  Review of Systems Pertinent items noted in HPI and remainder of comprehensive ROS otherwise negative.    Objective:     Vitals:   06/17/23 1031  BP: 132/78  Pulse: 84  Weight: 152 lb (68.9 kg)  Height: 5' (1.524 m)   Vitals:  WNL General appearance: alert, cooperative and no distress  HEENT: Normocephalic, without obvious abnormality, atraumatic Eyes: negative Throat: lips, mucosa, and tongue normal; teeth and gums normal  Respiratory: Clear to auscultation bilaterally  CV: Regular rate and rhythm  Breasts:  Normal appearance, no masses or tenderness, no nipple retraction or dimpling  GI: Soft, non-tender; bowel sounds normal; no masses,  no organomegaly  GU: External Genitalia:  Tanner V, no lesion Urethra:  No prolapse   Vagina: Pink, normal rugae, no blood or discharge  Cervix: No CMT, no lesion  Uterus:  Normal size and contour, non tender  Adnexa: Normal, no masses, non tender   Musculoskeletal: No edema, redness or tenderness in the calves or thighs  Skin: No lesions or rash  Lymphatic: Axillary adenopathy: none     Psychiatric: Normal mood and behavior        Assessment:    Healthy female exam.    Plan:    Pap not indicated--s/p hysterectomy PCP taking care of health maint.  Colon and Mammo up to date.  RTC 1 year

## 2023-06-17 ENCOUNTER — Encounter: Payer: Self-pay | Admitting: Obstetrics & Gynecology

## 2023-06-17 ENCOUNTER — Ambulatory Visit (INDEPENDENT_AMBULATORY_CARE_PROVIDER_SITE_OTHER): Payer: 59 | Admitting: Obstetrics & Gynecology

## 2023-06-17 VITALS — BP 132/78 | HR 84 | Ht 60.0 in | Wt 152.0 lb

## 2023-06-17 DIAGNOSIS — Z01419 Encounter for gynecological examination (general) (routine) without abnormal findings: Secondary | ICD-10-CM

## 2023-07-04 ENCOUNTER — Other Ambulatory Visit: Payer: Self-pay | Admitting: Family Medicine

## 2023-09-16 ENCOUNTER — Encounter: Payer: Self-pay | Admitting: Family Medicine

## 2023-09-16 ENCOUNTER — Ambulatory Visit: Payer: 59 | Admitting: Family Medicine

## 2023-09-16 VITALS — BP 116/74 | HR 70 | Ht 59.0 in | Wt 156.8 lb

## 2023-09-16 DIAGNOSIS — I10 Essential (primary) hypertension: Secondary | ICD-10-CM | POA: Diagnosis not present

## 2023-09-16 DIAGNOSIS — E119 Type 2 diabetes mellitus without complications: Secondary | ICD-10-CM

## 2023-09-16 DIAGNOSIS — E559 Vitamin D deficiency, unspecified: Secondary | ICD-10-CM

## 2023-09-16 DIAGNOSIS — Z0000 Encounter for general adult medical examination without abnormal findings: Secondary | ICD-10-CM

## 2023-09-16 DIAGNOSIS — E7800 Pure hypercholesterolemia, unspecified: Secondary | ICD-10-CM

## 2023-09-16 LAB — LIPID PANEL
Cholesterol: 132 mg/dL (ref 0–200)
HDL: 54.5 mg/dL (ref >39.00)
LDL Cholesterol: 62 mg/dL (ref 0–99)
NonHDL: 77.71
Total CHOL/HDL Ratio: 2
Triglycerides: 81 mg/dL (ref 0.0–149.0)
VLDL: 16.2 mg/dL (ref 0.0–40.0)

## 2023-09-16 LAB — CBC WITH DIFFERENTIAL/PLATELET
Basophils Absolute: 0 10*3/uL (ref 0.0–0.1)
Basophils Relative: 0.3 % (ref 0.0–3.0)
Eosinophils Absolute: 0 10*3/uL (ref 0.0–0.7)
Eosinophils Relative: 0.5 % (ref 0.0–5.0)
HCT: 41.1 % (ref 36.0–46.0)
Hemoglobin: 13 g/dL (ref 12.0–15.0)
Lymphocytes Relative: 29.6 % (ref 12.0–46.0)
Lymphs Abs: 2.3 10*3/uL (ref 0.7–4.0)
MCHC: 31.6 g/dL (ref 30.0–36.0)
MCV: 87.6 fL (ref 78.0–100.0)
Monocytes Absolute: 0.5 10*3/uL (ref 0.1–1.0)
Monocytes Relative: 6.7 % (ref 3.0–12.0)
Neutro Abs: 5 10*3/uL (ref 1.4–7.7)
Neutrophils Relative %: 62.9 % (ref 43.0–77.0)
Platelets: 299 10*3/uL (ref 150.0–400.0)
RBC: 4.7 Mil/uL (ref 3.87–5.11)
RDW: 14.9 % (ref 11.5–15.5)
WBC: 7.9 10*3/uL (ref 4.0–10.5)

## 2023-09-16 LAB — VITAMIN D 25 HYDROXY (VIT D DEFICIENCY, FRACTURES): VITD: 33.73 ng/mL (ref 30.00–100.00)

## 2023-09-16 LAB — MICROALBUMIN / CREATININE URINE RATIO
Creatinine,U: 80.5 mg/dL
Microalb Creat Ratio: 0.9 mg/g (ref 0.0–30.0)
Microalb, Ur: <0.7 mg/dL (ref 0.0–1.9)

## 2023-09-16 LAB — COMPREHENSIVE METABOLIC PANEL
ALT: 16 U/L (ref 0–35)
AST: 18 U/L (ref 0–37)
Albumin: 4.6 g/dL (ref 3.5–5.2)
Alkaline Phosphatase: 119 U/L — ABNORMAL HIGH (ref 39–117)
BUN: 11 mg/dL (ref 6–23)
CO2: 26 meq/L (ref 19–32)
Calcium: 9.5 mg/dL (ref 8.4–10.5)
Chloride: 105 meq/L (ref 96–112)
Creatinine, Ser: 0.94 mg/dL (ref 0.40–1.20)
GFR: 64.23 mL/min (ref >60.00)
Glucose, Bld: 90 mg/dL (ref 70–99)
Potassium: 4.5 meq/L (ref 3.5–5.1)
Sodium: 141 meq/L (ref 135–145)
Total Bilirubin: 0.5 mg/dL (ref 0.2–1.2)
Total Protein: 7.5 g/dL (ref 6.0–8.3)

## 2023-09-16 LAB — POCT GLYCOSYLATED HEMOGLOBIN (HGB A1C)
HbA1c POC (<> result, manual entry): 5.5 % (ref 4.0–5.6)
HbA1c, POC (controlled diabetic range): 5.5 % (ref 0.0–7.0)
HbA1c, POC (prediabetic range): 5.5 % — AB (ref 5.7–6.4)
Hemoglobin A1C: 5.5 % (ref 4.0–5.6)

## 2023-09-16 NOTE — Progress Notes (Addendum)
Office Note 09/16/2023  CC:  Chief Complaint  Patient presents with   Annual Exam    Pt is fasting.    Patient is a 62 y.o. female who is here for annual health maintenance exam and 64-month follow-up diabetes, hypertension, and hypercholesterolemia. A/P as of last visit: "1 diabetes without complication. Excellent control. POC Hba1c 5.5% today! Continue Ozempic 2 mg weekly. Checking electrolytes and creatinine today.   2. hypertension, well-controlled on lisinopril 10 mg a day. Electrolytes and creatinine today.   3.  Hypercholesterolemia, doing well on atorvastatin 20 mg a day. Lipid and hepatic panel today.   #4 degenerative disc disease lumbar spine.  Her orthopedist had recommended that she have a Medrol Dosepak to have with her just in case of an acute back pain flare when she travels long distances. She will be taking a long flight to [place] later this summer.  I have sent in a prescription for Medrol Dosepak today, number 1 pack, refill x 1."  INTERIM HX: Martha [name] is feeling well. She got a couple of steroid injections in her back since I saw her last and she says these have helped well. She also saw surgeon for consultation but they recommended no surgery.  Home glucoses very good. Home blood pressures consistently normal.   Past Medical History:  Diagnosis Date   Allergic rhinitis    Anemia    Arthralgia of multiple sites    Gen rheum lab w/u normal/neg 11/2016 by prior PCP   Constipation    DDD (degenerative disc disease), lumbar 11/2020   R glut/hip pain->ortho, MRI->disc causing pinched nerve at L3-4 level on R, signif dz on L at L4-5 and L5-S1--->ESI on R 11/2020 very helpful, +PT.   Diabetes mellitus (HCC) 2022   Prediab 2017 --Old PCP records state only that her Hba1c was 6.1% in 2017: she took metformin x ? 6-12 mo, rx'd by wt loss MD.  A1c 6.1% Nov 2019. A1c 6.2% 12/2019. A1c 6.8% 12/2020.   Diverticulosis    Fatty liver    Noted on CT done during her  hospitalization for pyelo 03/2022   GERD (gastroesophageal reflux disease)    +grade A esophagitis 06/2019 EGD.  Bx neg for eosinophilic esoph. 08/2019 esoph manometry normal, pH probe + abnl reflux not fully suppressed by PPI. PPI BID + H2 blocker as of 08/2019.   History of rectal bleeding    remote past->? rectal ulcer vs perf'd divertic-->need old records for clarif.   History of recurrent UTIs    urol 2023   Hypercholesterolemia    Past hx of statin use, then was able to come off meds when she lost wt   Hypertension    Insomnia    Obesity, Class I, BMI 30-34.9    Palpitations    Pyelonephritis    hosp 03/2022   Right hip pain    MRI showed labral tear, got steroid injection. Also troch bursitis.   Right shoulder pain 05/2020   RC tear, bicipital pathology, AC arthrosis and impingement-->to have surgery    Past Surgical History:  Procedure Laterality Date   53 HOUR PH STUDY N/A 08/05/2019   +GERD. Procedure: 24 HOUR PH STUDY;  Surgeon: Tressia Danas, MD;  [facility];  Service: Gastroenterology;  Laterality: N/A;   ABDOMINAL HYSTERECTOMY  2005   for benign dx (No BSO)   BREAST BIOPSY Right 2005   CESAREAN SECTION     x 2   CHOLECYSTECTOMY  2011   COLONOSCOPY  most recent 01/2012   x 2->approx 2011 was done for BRBPR, unclear dx (rectal ulcer? perf'd diverticulum?).  Another about 2015 for abd pain w/u?  No polyps detected on either colonoscopy.  Need old records for clarification (GI MD was Dr. Thedore [place] in [place]prior pcp records say "colonoscopy 01/2012 negative".   ESOPHAGEAL MANOMETRY N/A 08/05/2019   NORMAL Procedure: ESOPHAGEAL MANOMETRY (EM);  Surgeon: Tressia Danas, MD;  [facility]: [facility];  Service: Gastroenterology;  Laterality: N/A;   ESOPHAGOGASTRODUODENOSCOPY  06/2019     06/2019->reflux esophagitis, o/w normal.   ESOPHAGOGASTRODUODENOSCOPY N/A 02/27/2022   Procedure: ESOPHAGOGASTRODUODENOSCOPY (EGD);  Surgeon: Shellia Cleverly, DO;  Location: [facility];  Service: Gastroenterology;  Laterality: N/A;   HIATAL HERNIA REPAIR N/A 02/27/2022   Procedure: LAPAROSCOPIC REPAIR OF HIATAL HERNIA;  Surgeon: Gaynelle Adu, MD;  Location: [facility];  Service: General;  Laterality: N/A;   ROTATOR CUFF REPAIR Right 06/2021   TONSILLECTOMY AND ADENOIDECTOMY  2009   TRANSORAL INCISIONLESS FUNDOPLICATION N/A 02/27/2022   Procedure: TRANSORAL INCISIONLESS FUNDOPLICATION;  Surgeon: Shellia Cleverly, DO;  Location: [facility];  Service: Gastroenterology;  Laterality: N/A;    Family History  Problem Relation Age of Onset   Diabetes Mother    Alcohol abuse Mother    High blood pressure Mother    Rheumatic fever Father    Heart attack Father    Diabetes Brother    Breast cancer Maternal Aunt    Colon cancer Neg Hx    Esophageal cancer Neg Hx    Rectal cancer Neg Hx    Stomach cancer Neg Hx     Social History   Socioeconomic History   Marital status: Married    Spouse name: Elmyra Ricks   Number of children: 2   Years of education: Not on file   Highest education level: Bachelor's degree (e.g., BA, AB, BS)  Occupational History   Occupation: Conservation officer, historic buildings, Chartered loss adjuster (travel required)  Tobacco Use   Smoking status: Never   Smokeless tobacco: Never  Vaping Use   Vaping status: Never Used  Substance and Sexual Activity   Alcohol use: Not Currently    Comment: wine occasional   Drug use: Never   Sexual activity: Not on file  Other Topics Concern   Not on file  Social History Narrative   Married, 2 daughters.   Moved to [facility] from [facility] 2019 to be closer to daughter and grandchildren.   Educ: College in [place].     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   In [place], she works from home for [employer].   Social Determinants of Health   Financial Resource Strain: Low Risk  (03/11/2023)   Overall Financial Resource Strain (CARDIA)    Difficulty of Paying Living Expenses: Not hard at all  Food Insecurity: No Food Insecurity  (03/11/2023)   Hunger Vital Sign    Worried About Running Out of Food in the Last Year: Never true    Ran Out of Food in the Last Year: Never true  Transportation Needs: No Transportation Needs (03/11/2023)   PRAPARE - Administrator, Civil Service (Medical): No    Lack of Transportation (Non-Medical): No  Physical Activity: Insufficiently Active (03/11/2023)   Exercise Vital Sign    Days of Exercise per Week: 3 days    Minutes of Exercise per Session: 30 min  Stress: No Stress Concern Present (03/11/2023)   [employer] of Occupational Health - Occupational Stress Questionnaire  Feeling of Stress : Only a little  Social Connections: Moderately Isolated (03/11/2023)   Social Connection and Isolation Panel [NHANES]    Frequency of Communication with Friends and Family: More than three times a week    Frequency of Social Gatherings with Friends and Family: Patient declined    Attends Religious Services: Never    Database administrator or Organizations: No    Attends Engineer, structural: Not on file    Marital Status: Married  Catering manager Violence: Not on file    Outpatient Medications Prior to Visit  Medication Sig Dispense Refill   acetaminophen (TYLENOL) 500 MG tablet Take 1,000 mg by mouth every 6 (six) hours as needed for mild pain, fever or headache.     atorvastatin (LIPITOR) 20 MG tablet TAKE 1 TABLET BY MOUTH ONCE  DAILY 90 tablet 2   cholecalciferol (VITAMIN D) 25 MCG (1000 UT) tablet Take 1,000 Units by mouth daily.     gabapentin (NEURONTIN) 300 MG capsule Take 300 mg by mouth every other day.     levocetirizine (XYZAL) 5 MG tablet Take 5 mg by mouth daily.      lisinopril (ZESTRIL) 10 MG tablet TAKE 1 TABLET BY MOUTH DAILY 90 tablet 2   meloxicam (MOBIC) 15 MG tablet Take 15 mg by mouth in the morning and at bedtime.     methylPREDNISolone (MEDROL DOSEPAK) 4 MG TBPK tablet Use as directed. 21 each 1   montelukast (SINGULAIR) 10 MG tablet TAKE 1  TABLET BY MOUTH AT  BEDTIME 90 tablet 2   Omega-3 Fatty Acids (OMEGA-3 FISH OIL PO) Take 1 capsule by mouth daily.     OZEMPIC, 2 MG/DOSE, 8 MG/3ML SOPN INJECT SUBCUTANEOUSLY 2 MG EVERY WEEK 9 mL 3   No facility-administered medications prior to visit.    Allergies  Allergen Reactions   Codeine Anaphylaxis    Review of Systems  Constitutional:  Negative for appetite change, chills, fatigue and fever.  HENT:  Negative for congestion, dental problem, ear pain and sore throat.   Eyes:  Negative for discharge, redness and visual disturbance.  Respiratory:  Negative for cough, chest tightness, shortness of breath and wheezing.   Cardiovascular:  Negative for chest pain, palpitations and leg swelling.  Gastrointestinal:  Negative for abdominal pain, blood in stool, diarrhea, nausea and vomiting.  Genitourinary:  Negative for difficulty urinating, dysuria, flank pain, frequency, hematuria and urgency.  Musculoskeletal:  Negative for arthralgias, back pain, joint swelling, myalgias and neck stiffness.  Skin:  Negative for pallor and rash.  Neurological:  Negative for dizziness, speech difficulty, weakness and headaches.  Hematological:  Negative for adenopathy. Does not bruise/bleed easily.  Psychiatric/Behavioral:  Negative for confusion and sleep disturbance. The patient is not nervous/anxious.    PE;    09/16/2023    9:35 AM 06/17/2023   10:31 AM 03/13/2023    9:39 AM  Vitals with BMI  Height 4\' 11"  5\' 0"  5\' 0"   Weight 156 lbs 13 oz 152 lbs 156 lbs 3 oz  BMI 31.65 29.69 30.51  Systolic 116 132 846  Diastolic 74 78 81  Pulse 70 84 77    Exam chaperoned by Cloe Motsinger, CMA Gen: Alert, well appearing.  Patient is oriented to person, place, time, and situation. AFFECT: pleasant, lucid thought and speech. ENT: Ears: EACs clear, normal epithelium.  TMs with good light reflex and landmarks bilaterally.  Eyes: no injection, icteris, swelling, or exudate.  EOMI, PERRLA. Nose: no drainage  or turbinate edema/swelling.  No injection or focal lesion.  Mouth: lips without lesion/swelling.  Oral mucosa pink and moist.  Dentition intact and without obvious caries or gingival swelling.  Oropharynx without erythema, exudate, or swelling.  Neck: supple/nontender.  No LAD, mass, or TM.  Carotid pulses 2+ bilaterally, without bruits. CV: RRR, no m/r/g.   LUNGS: CTA bilat, nonlabored resps, good aeration in all lung fields. ABD: soft, NT, ND, BS normal.  No hepatospenomegaly or mass.  No bruits. EXT: no clubbing, cyanosis, or edema.  Musculoskeletal: no joint swelling, erythema, warmth, or tenderness.  ROM of all joints intact. Skin - no sores or suspicious lesions or rashes or color changes Foot exam - no swelling, tenderness or skin or vascular lesions. Color and temperature is normal. Sensation is intact. Peripheral pulses are palpable. Toenails are normal.  Pertinent labs:  Lab Results  Component Value Date   TSH 2.12 09/11/2022   Lab Results  Component Value Date   WBC 7.7 09/11/2022   HGB 12.8 09/11/2022   HCT 39.9 09/11/2022   MCV 83.1 09/11/2022   PLT 306.0 09/11/2022   Lab Results  Component Value Date   CREATININE 1.06 03/13/2023   BUN 10 03/13/2023   NA 141 03/13/2023   K 4.4 03/13/2023   CL 104 03/13/2023   CO2 24 03/13/2023   Lab Results  Component Value Date   ALT 31 03/13/2023   AST 26 03/13/2023   ALKPHOS 79 03/13/2023   BILITOT 0.5 03/13/2023   Lab Results  Component Value Date   CHOL 125 03/13/2023   Lab Results  Component Value Date   HDL 58.60 03/13/2023   Lab Results  Component Value Date   LDLCALC 39 03/13/2023   Lab Results  Component Value Date   TRIG 136.0 03/13/2023   Lab Results  Component Value Date   CHOLHDL 2 03/13/2023   Lab Results  Component Value Date   HGBA1C 5.5 03/13/2023   HGBA1C 5.5 03/13/2023   HGBA1C 5.5 (A) 03/13/2023   HGBA1C 5.5 03/13/2023  Last vitamin D Lab Results  Component Value Date   VD25OH  49.56 09/11/2022   Lab Results  Component Value Date   VITAMINB12 438 01/04/2020   ASSESSMENT AND PLAN:   #1 health maintenance exam: Reviewed age and gender appropriate health maintenance issues (prudent diet, regular exercise, health risks of tobacco and excessive alcohol, use of seatbelts, fire alarms in home, use of sunscreen).  Also reviewed age and gender appropriate health screening as well as vaccine recommendations. Vaccines: ALL UTD. Labs: CBC, CMET, FLP,  A1c, vitamin D level (vit D def). Cervical ca screening: n/a--> history of hysterectomy for benign diagnosis. Breast ca screening: Mammogram due February 2025 Colon ca screening: [place]--> her GI MD here, Dr. Barron Alvine, discussed things with Martha [name] and has recommended her next colonoscopy to be in 2026.  #2 diabetes without complication. Point-of-care A1c today is 5.5% Continue Ozempic 2 mg weekly. Checking urine microalbumin/creatinine and bmet today. Feet exam normal today.  2. hypertension, well-controlled on lisinopril 10 mg a day. Electrolytes and creatinine today.   3.  Hypercholesterolemia, doing well on atorvastatin 20 mg a day. Lipid and hepatic panel today.  An After Visit Summary was printed and given to the patient.  FOLLOW UP:  No follow-ups on file.  Signed:  Santiago Bumpers, MD           09/16/2023

## 2023-09-18 ENCOUNTER — Other Ambulatory Visit: Payer: Self-pay | Admitting: Family Medicine

## 2023-09-19 ENCOUNTER — Other Ambulatory Visit: Payer: Self-pay | Admitting: Family Medicine

## 2023-09-19 ENCOUNTER — Encounter: Payer: Self-pay | Admitting: Family Medicine

## 2023-09-19 NOTE — Telephone Encounter (Signed)
No further action needed.

## 2023-09-23 ENCOUNTER — Encounter: Payer: Self-pay | Admitting: Obstetrics & Gynecology

## 2023-09-23 ENCOUNTER — Ambulatory Visit (INDEPENDENT_AMBULATORY_CARE_PROVIDER_SITE_OTHER): Payer: 59 | Admitting: Obstetrics & Gynecology

## 2023-09-23 VITALS — BP 123/83 | HR 73 | Ht 59.0 in | Wt 158.0 lb

## 2023-09-23 DIAGNOSIS — S30814A Abrasion of vagina and vulva, initial encounter: Secondary | ICD-10-CM

## 2023-09-23 NOTE — Progress Notes (Signed)
   Subjective:    Patient ID: Martha [name], female    DOB: 25-Mar-1961, 62 y.o.   MRN: [id number]  HPI  62 yo female had intercourse x2 and bled after 2nd time.  Thought it iwas UTIand was not.  Pt had husband look and he found a sore on left labia--red pencil eraser size abrasion.  There was pain associated with the second intercourse.  They did not use lubrication.  In general sexual intercourse does not hurt.  Patient is status post hysterectomy.  Review of Systems  Constitutional: Negative.   Respiratory: Negative.    Cardiovascular: Negative.   Gastrointestinal: Negative.   Genitourinary:  Positive for vaginal bleeding.       Objective:   Physical Exam Vitals reviewed.  Constitutional:      General: She is not in acute distress.    Appearance: She is well-developed.  HENT:     Head: Normocephalic and atraumatic.  Eyes:     Conjunctiva/sclera: Conjunctivae normal.  Cardiovascular:     Rate and Rhythm: Normal rate.  Pulmonary:     Effort: Pulmonary effort is normal.  Genitourinary:   Skin:    General: Skin is warm and dry.  Neurological:     Mental Status: She is alert and oriented to person, place, and time.  Psychiatric:        Mood and Affect: Mood normal.    Vitals:   09/23/23 0850  BP: 123/83  Pulse: 73  Weight: 158 lb (71.7 kg)  Height: 4\' 11"  (1.499 m)       Assessment & Plan:  62 year old female status post 1 episode of vaginal bleeding after intercourse  Noted above, it was associated with pain and a abrasion on her left labia interim majora.  It is well-healed now.  It was 3 weeks between the inciting event and availability of appointment.  In the future, I told her if it happens again that we will see her while the lesion is present.  It would be a short visit and we can get her in very quickly. Samples of Uber lube given and advised to use liberally.  If other vaginal dryness occurs, we can give her vulvar moisturizers as well.

## 2023-10-07 ENCOUNTER — Other Ambulatory Visit: Payer: Self-pay | Admitting: Family Medicine

## 2023-10-07 DIAGNOSIS — Z1231 Encounter for screening mammogram for malignant neoplasm of breast: Secondary | ICD-10-CM

## 2023-10-14 ENCOUNTER — Other Ambulatory Visit (HOSPITAL_COMMUNITY): Payer: Self-pay

## 2023-11-20 ENCOUNTER — Ambulatory Visit: Payer: BC Managed Care – PPO

## 2023-11-20 DIAGNOSIS — Z1231 Encounter for screening mammogram for malignant neoplasm of breast: Secondary | ICD-10-CM

## 2024-03-16 ENCOUNTER — Ambulatory Visit: Payer: 59 | Admitting: Family Medicine

## 2024-04-03 ENCOUNTER — Ambulatory Visit: Admitting: Family Medicine

## 2024-04-30 LAB — HM DIABETES EYE EXAM

## 2024-05-05 ENCOUNTER — Ambulatory Visit: Payer: Self-pay | Admitting: Family Medicine

## 2024-05-05 ENCOUNTER — Ambulatory Visit (INDEPENDENT_AMBULATORY_CARE_PROVIDER_SITE_OTHER): Admitting: Family Medicine

## 2024-05-05 ENCOUNTER — Encounter: Payer: Self-pay | Admitting: Family Medicine

## 2024-05-05 VITALS — BP 117/76 | HR 75 | Temp 97.9°F | Ht 59.0 in | Wt 158.4 lb

## 2024-05-05 DIAGNOSIS — E7800 Pure hypercholesterolemia, unspecified: Secondary | ICD-10-CM | POA: Diagnosis not present

## 2024-05-05 DIAGNOSIS — Z7985 Long-term (current) use of injectable non-insulin antidiabetic drugs: Secondary | ICD-10-CM | POA: Diagnosis not present

## 2024-05-05 DIAGNOSIS — E119 Type 2 diabetes mellitus without complications: Secondary | ICD-10-CM

## 2024-05-05 DIAGNOSIS — I10 Essential (primary) hypertension: Secondary | ICD-10-CM | POA: Diagnosis not present

## 2024-05-05 LAB — COMPREHENSIVE METABOLIC PANEL WITH GFR
ALT: 26 U/L (ref 0–35)
AST: 24 U/L (ref 0–37)
Albumin: 4.6 g/dL (ref 3.5–5.2)
Alkaline Phosphatase: 98 U/L (ref 39–117)
BUN: 10 mg/dL (ref 6–23)
CO2: 26 meq/L (ref 19–32)
Calcium: 9.8 mg/dL (ref 8.4–10.5)
Chloride: 105 meq/L (ref 96–112)
Creatinine, Ser: 1.02 mg/dL (ref 0.40–1.20)
GFR: 57.98 mL/min — ABNORMAL LOW (ref >60.00)
Glucose, Bld: 98 mg/dL (ref 70–99)
Potassium: 4.8 meq/L (ref 3.5–5.1)
Sodium: 141 meq/L (ref 135–145)
Total Bilirubin: 0.5 mg/dL (ref 0.2–1.2)
Total Protein: 7.2 g/dL (ref 6.0–8.3)

## 2024-05-05 LAB — LIPID PANEL
Cholesterol: 132 mg/dL (ref 0–200)
HDL: 59.3 mg/dL (ref >39.00)
LDL Cholesterol: 57 mg/dL (ref 0–99)
NonHDL: 72.54
Total CHOL/HDL Ratio: 2
Triglycerides: 76 mg/dL (ref 0.0–149.0)
VLDL: 15.2 mg/dL (ref 0.0–40.0)

## 2024-05-05 LAB — MICROALBUMIN / CREATININE URINE RATIO
Creatinine,U: 94.2 mg/dL
Microalb Creat Ratio: 8.3 mg/g (ref 0.0–30.0)
Microalb, Ur: 0.8 mg/dL (ref 0.0–1.9)

## 2024-05-05 LAB — POCT GLYCOSYLATED HEMOGLOBIN (HGB A1C)
HbA1c POC (<> result, manual entry): 5.6 % (ref 4.0–5.6)
HbA1c, POC (controlled diabetic range): 5.6 % (ref 0.0–7.0)
HbA1c, POC (prediabetic range): 5.6 % — AB (ref 5.7–6.4)
Hemoglobin A1C: 5.6 % (ref 4.0–5.6)

## 2024-05-05 MED ORDER — ATORVASTATIN CALCIUM 20 MG PO TABS: 20.0000 mg | ORAL_TABLET | Freq: Every day | ORAL | 3 refills | Status: AC

## 2024-05-05 MED ORDER — LISINOPRIL 10 MG PO TABS: 10.0000 mg | ORAL_TABLET | Freq: Every day | ORAL | 3 refills | Status: AC

## 2024-05-05 MED ORDER — MONTELUKAST SODIUM 10 MG PO TABS: 10.0000 mg | ORAL_TABLET | Freq: Every day | ORAL | 3 refills | Status: AC

## 2024-05-05 NOTE — Progress Notes (Signed)
 OFFICE VISIT  05/05/2024  CC:  Chief Complaint  Patient presents with   Medical Management of Chronic Issues    Pt is fasting    Patient is a 63 y.o. female who presents accompanied by her husband [name] for 47-month follow-up diabetes, hypertension, and hypercholesterolemia. A/P as of last visit: 1 diabetes without complication. Point-of-care A1c today is 5.5% Continue Ozempic  2 mg weekly. Checking urine microalbumin/creatinine and bmet today. Feet exam normal today.   2. hypertension, well-controlled on lisinopril  10 mg a day. Electrolytes and creatinine today.   3.  Hypercholesterolemia, doing well on atorvastatin  20 mg a day. Lipid and hepatic panel today.  INTERIM HX: Martha [name] feels well other than her chronic back pain (she gets back injections periodically, has one planned for next week). She describes lots of stress in life lately.  Unfortunately, her daughter in [place]  has had some serious illnesses and [name] has visited for a month on a couple of occasions.  She is conflicted because she wants to move back to be near her daughter and grandkids but she feels like the healthcare she gets here is much better. She feels lonely, misses her friends down in [place] . Denies feeling clinically depressed. Some of her fasting glucoses have been higher lately in the setting of all the stress (120's fasting compared to her usual 80s to 90s fasting).  ROS --> no fevers, no CP, no SOB, no wheezing, no cough, no dizziness, no HAs, no rashes, no melena/hematochezia.  No polyuria or polydipsia.  No myalgias or arthralgias.  No focal weakness, paresthesias, or tremors.  No acute vision or hearing abnormalities.  No dysuria or unusual/new urinary urgency or frequency.  No recent changes in lower legs. No n/v/d or abd pain.  No palpitations.    Past Medical History:  Diagnosis Date   Allergic rhinitis    Anemia    Arthralgia of multiple sites    Gen rheum lab w/u normal/neg 11/2016 by prior PCP    Constipation    DDD (degenerative disc disease), lumbar 11/2020   R glut/hip pain->ortho, MRI->disc causing pinched nerve at L3-4 level on R, signif dz on L at L4-5 and L5-S1--->ESI on R 11/2020 very helpful, +PT.   Diabetes mellitus (HCC) 2022   Prediab 2017 --Old PCP records state only that her Hba1c was 6.1% in 2017: she took metformin  x ? 6-12 mo, rx'd by wt loss MD.  A1c 6.1% Nov 2019. A1c 6.2% 12/2019. A1c 6.8% 12/2020.   Diverticulosis    Fatty liver    Noted on CT done during her hospitalization for pyelo 03/2022   GERD (gastroesophageal reflux disease)    +grade A esophagitis 06/2019 EGD.  Bx neg for eosinophilic esoph. 08/2019 esoph manometry normal, pH probe + abnl reflux not fully suppressed by PPI. PPI BID + H2 blocker as of 08/2019.   History of rectal bleeding    remote past->? rectal ulcer vs perf'd divertic-->need old records for clarif.   History of recurrent UTIs    urol 2023   Hypercholesterolemia    Past hx of statin use, then was able to come off meds when she lost wt   Hypertension    Insomnia    Obesity, Class I, BMI 30-34.9    Palpitations    Pyelonephritis    hosp 03/2022   Right hip pain    MRI showed labral tear, got steroid injection. Also troch bursitis.   Right shoulder pain 05/2020   RC tear, bicipital pathology, AC arthrosis  and impingement-->to have surgery    Past Surgical History:  Procedure Laterality Date   3 HOUR PH STUDY N/A 08/05/2019   +GERD. Procedure: 24 HOUR PH STUDY;  Surgeon: Eda Iha, MD;  [facility];  Service: Gastroenterology;  Laterality: N/A;   ABDOMINAL HYSTERECTOMY  2005   for benign dx (No BSO)   BREAST BIOPSY Right 2005   CESAREAN SECTION     x 2   CHOLECYSTECTOMY  2011   COLONOSCOPY  most recent 01/2012   x 2->approx 2011 was done for BRBPR, unclear dx (rectal ulcer? perf'd diverticulum?).  Another about 2015 for abd pain w/u?  No polyps detected on either colonoscopy.  Need old records for clarification  (GI MD was Dr. [place]enn[place] in [place])--prior pcp records say colonoscopy 01/2012 negative.   ESOPHAGEAL MANOMETRY N/A 08/05/2019   NORMAL Procedure: ESOPHAGEAL MANOMETRY (EM);  Surgeon: Eda Iha, MD;  [facility];  Service: Gastroenterology;  Laterality: N/A;   ESOPHAGOGASTRODUODENOSCOPY  06/2019     06/2019->reflux esophagitis, o/w normal.   ESOPHAGOGASTRODUODENOSCOPY N/A 02/27/2022   Procedure: ESOPHAGOGASTRODUODENOSCOPY (EGD);  Surgeon: San Sandor GAILS, DO;  Location: [facility];  Service: Gastroenterology;  Laterality: N/A;   HIATAL HERNIA REPAIR N/A 02/27/2022   Procedure: LAPAROSCOPIC REPAIR OF HIATAL HERNIA;  Surgeon: Tanda Locus, MD;  Location: [facility];  Service: General;  Laterality: N/A;   ROTATOR CUFF REPAIR Right 06/2021   TONSILLECTOMY AND ADENOIDECTOMY  2009   TRANSORAL INCISIONLESS FUNDOPLICATION N/A 02/27/2022   Procedure: TRANSORAL INCISIONLESS FUNDOPLICATION;  Surgeon: San Sandor GAILS, DO;  Location: [facility];  Service: Gastroenterology;  Laterality: N/A;    Outpatient Medications Prior to Visit  Medication Sig Dispense Refill   acetaminophen  (TYLENOL ) 500 MG tablet Take 1,000 mg by mouth every 6 (six) hours as needed for mild pain, fever or headache.     cholecalciferol (VITAMIN D ) 25 MCG (1000 UT) tablet Take 1,000 Units by mouth daily.     gabapentin  (NEURONTIN ) 300 MG capsule Take 300 mg by mouth every other day.     levocetirizine (XYZAL ) 5 MG tablet Take 5 mg by mouth daily.      meloxicam  (MOBIC ) 15 MG tablet Take 15 mg by mouth in the morning and at bedtime.     methylPREDNISolone  (MEDROL  DOSEPAK) 4 MG TBPK tablet Use as directed. 21 each 1   nitrofurantoin (MACRODANTIN) 50 MG capsule Take 50 mg by mouth. Post sexual activity to prevent UTI's.     Omega-3 Fatty Acids (OMEGA-3 FISH OIL PO) Take 1 capsule by mouth daily.     OZEMPIC , 2 MG/DOSE, 8 MG/3ML SOPN INJECT SUBCUTANEOUSLY 2 MG EVERY WEEK 9 mL 3   atorvastatin  (LIPITOR) 20 MG tablet TAKE 1 TABLET BY  MOUTH ONCE  DAILY 90 tablet 1   lisinopril  (ZESTRIL ) 10 MG tablet TAKE 1 TABLET BY MOUTH DAILY 90 tablet 1   montelukast  (SINGULAIR ) 10 MG tablet TAKE 1 TABLET BY MOUTH AT  BEDTIME 90 tablet 1   No facility-administered medications prior to visit.    Allergies  Allergen Reactions   Codeine Anaphylaxis    Review of Systems As per HPI  PE:    05/05/2024    8:29 AM 09/23/2023    8:50 AM 09/16/2023    9:35 AM  Vitals with BMI  Height 4' 11 4' 11 4' 11  Weight 158 lbs 6 oz 158 lbs 156 lbs 13 oz  BMI 31.98 31.89 31.65  Systolic 117 123 883  Diastolic 76 83 74  Pulse  75 73 70     Physical Exam Gen: Alert, well appearing.  Patient is oriented to person, place, time, and situation. AFFECT: pleasant, lucid thought and speech. No further exam today  LABS:  Last CBC Lab Results  Component Value Date   WBC 7.9 09/16/2023   HGB 13.0 09/16/2023   HCT 41.1 09/16/2023   MCV 87.6 09/16/2023   MCH 26.6 03/18/2022   RDW 14.9 09/16/2023   PLT 299.0 09/16/2023   Last metabolic panel Lab Results  Component Value Date   GLUCOSE 90 09/16/2023   NA 141 09/16/2023   K 4.5 09/16/2023   CL 105 09/16/2023   CO2 26 09/16/2023   BUN 11 09/16/2023   CREATININE 0.94 09/16/2023   GFR 64.23 09/16/2023   CALCIUM  9.5 09/16/2023   PROT 7.5 09/16/2023   ALBUMIN 4.6 09/16/2023   LABGLOB 2.6 01/04/2020   AGRATIO 1.7 01/04/2020   BILITOT 0.5 09/16/2023   ALKPHOS 119 (H) 09/16/2023   AST 18 09/16/2023   ALT 16 09/16/2023   ANIONGAP 9 03/18/2022   Last lipids Lab Results  Component Value Date   CHOL 132 09/16/2023   HDL 54.50 09/16/2023   LDLCALC 62 09/16/2023   TRIG 81.0 09/16/2023   CHOLHDL 2 09/16/2023   Last hemoglobin A1c Lab Results  Component Value Date   HGBA1C 5.6 05/05/2024   HGBA1C 5.6 05/05/2024   HGBA1C 5.6 (A) 05/05/2024   HGBA1C 5.6 05/05/2024   Last thyroid  functions Lab Results  Component Value Date   TSH 2.12 09/11/2022   T3TOTAL 130 01/04/2020    Last vitamin D  Lab Results  Component Value Date   VD25OH 33.73 09/16/2023   Last vitamin B12 and Folate Lab Results  Component Value Date   VITAMINB12 438 01/04/2020   FOLATE 4.6 01/04/2020   IMPRESSION AND PLAN:  #1 diabetes without complication.  Well-controlled on Ozempic  2 mg weekly. Hemoglobin A1c is 5.6% today. Urine microalbumin/creatinine and serum creatinine today.  #2 essential hypertension, doing well on lisinopril  10 mg a day. Electrolytes and creatinine monitoring today.  3.  Hypercholesterolemia, doing well on a atorvastatin  20 mg daily. Lipid panel and hepatic panel today.  An After Visit Summary was printed and given to the patient.  FOLLOW UP: Return in about 6 months (around 11/05/2024) for annual CPE (fasting). Next CPE after 09/15/2024 Signed:  Gerlene Hockey, MD           05/05/2024

## 2024-05-12 DIAGNOSIS — Z1211 Encounter for screening for malignant neoplasm of colon: Secondary | ICD-10-CM

## 2024-05-12 DIAGNOSIS — E2839 Other primary ovarian failure: Secondary | ICD-10-CM

## 2024-05-12 NOTE — Telephone Encounter (Signed)
 Order signed

## 2024-05-12 NOTE — Telephone Encounter (Signed)
 Please advise if okay to to place order for cologuard

## 2024-06-11 NOTE — Telephone Encounter (Signed)
 No further action needed.

## 2024-06-11 NOTE — Telephone Encounter (Signed)
 Please order DEXA for the imaging location of her choice. Diagnosis is estrogen deficiency.

## 2024-06-18 LAB — COLOGUARD: COLOGUARD: NEGATIVE

## 2024-06-19 ENCOUNTER — Encounter: Payer: Self-pay | Admitting: Family Medicine

## 2024-06-19 ENCOUNTER — Ambulatory Visit: Payer: Self-pay | Admitting: Family Medicine

## 2024-06-19 NOTE — Telephone Encounter (Signed)
 No further action needed.

## 2024-07-20 ENCOUNTER — Other Ambulatory Visit: Payer: Self-pay

## 2024-08-04 ENCOUNTER — Other Ambulatory Visit: Payer: Self-pay

## 2024-08-04 MED ORDER — OZEMPIC (2 MG/DOSE) 8 MG/3ML ~~LOC~~ SOPN: PEN_INJECTOR | SUBCUTANEOUS | 3 refills | Status: AC

## 2024-08-22 ENCOUNTER — Ambulatory Visit (HOSPITAL_BASED_OUTPATIENT_CLINIC_OR_DEPARTMENT_OTHER)
Admission: RE | Admit: 2024-08-22 | Discharge: 2024-08-22 | Disposition: A | Source: Ambulatory Visit | Attending: Family Medicine | Admitting: Family Medicine

## 2024-08-22 DIAGNOSIS — E2839 Other primary ovarian failure: Secondary | ICD-10-CM | POA: Insufficient documentation

## 2024-08-24 ENCOUNTER — Encounter: Payer: Self-pay | Admitting: Family Medicine

## 2024-08-24 NOTE — Telephone Encounter (Signed)
 No further action needed at this time.

## 2024-10-19 ENCOUNTER — Other Ambulatory Visit (HOSPITAL_BASED_OUTPATIENT_CLINIC_OR_DEPARTMENT_OTHER): Payer: Self-pay | Admitting: Family Medicine

## 2024-10-19 DIAGNOSIS — Z1231 Encounter for screening mammogram for malignant neoplasm of breast: Secondary | ICD-10-CM

## 2024-10-26 ENCOUNTER — Encounter: Payer: Self-pay | Admitting: Podiatry

## 2024-10-26 ENCOUNTER — Ambulatory Visit: Payer: Self-pay | Admitting: Podiatry

## 2024-10-26 DIAGNOSIS — L600 Ingrowing nail: Secondary | ICD-10-CM | POA: Diagnosis not present

## 2024-10-26 DIAGNOSIS — R7303 Prediabetes: Secondary | ICD-10-CM

## 2024-10-26 DIAGNOSIS — Z0189 Encounter for other specified special examinations: Secondary | ICD-10-CM

## 2024-10-26 DIAGNOSIS — E119 Type 2 diabetes mellitus without complications: Secondary | ICD-10-CM | POA: Diagnosis not present

## 2024-10-26 NOTE — Progress Notes (Signed)
 "  Subjective:  Patient ID: Martha [name], female    DOB: April 29, 1961,   MRN: [id number]  Chief Complaint  Patient presents with   Toe Pain    L great toe has a shooting pain when stepping on edge of stairs or uneven ground R foot callus 3rd toe.  Pre diabetic.    64 y.o. female presents for diabetic foot check.  Relates she does have concern for shooting pain in her left great toe area at times.  She also relates some calluses that build up that she files down herself from time to time.  Denies burning and tingling in their feet. Patient is diabetic and last A1c was  Lab Results  Component Value Date   HGBA1C 5.6 05/05/2024   HGBA1C 5.6 05/05/2024   HGBA1C 5.6 (A) 05/05/2024   HGBA1C 5.6 05/05/2024   .   PCP:  Candise Aleene DEL, MD    . Denies any other pedal complaints. Denies n/v/f/c.   Past Medical History:  Diagnosis Date   Allergic rhinitis    Anemia    Arthralgia of multiple sites    Gen rheum lab w/u normal/neg 11/2016 by prior PCP   Colon cancer screening    06/2024 cologuard negative   Constipation    DDD (degenerative disc disease), lumbar 11/2020   R glut/hip pain->ortho, MRI->disc causing pinched nerve at L3-4 level on R, signif dz on L at L4-5 and L5-S1--->ESI on R 11/2020 very helpful, +PT.   Diabetes mellitus (HCC) 2022   Prediab 2017 --Old PCP records state only that her Hba1c was 6.1% in 2017: she took metformin  x ? 6-12 mo, rx'd by wt loss MD.  A1c 6.1% Nov 2019. A1c 6.2% 12/2019. A1c 6.8% 12/2020.   Diverticulosis    Fatty liver    Noted on CT done during her hospitalization for pyelo 03/2022   GERD (gastroesophageal reflux disease)    +grade A esophagitis 06/2019 EGD.  Bx neg for eosinophilic esoph. 08/2019 esoph manometry normal, pH probe + abnl reflux not fully suppressed by PPI. PPI BID + H2 blocker as of 08/2019.   History of rectal bleeding    remote past->? rectal ulcer vs perf'd divertic-->need old records for clarif.   History of recurrent UTIs     urol 2023   Hypercholesterolemia    Past hx of statin use, then was able to come off meds when she lost wt   Hypertension    Insomnia    Obesity, Class I, BMI 30-34.9    Palpitations    Pyelonephritis    hosp 03/2022   Right hip pain    MRI showed labral tear, got steroid injection. Also troch bursitis.   Right shoulder pain 05/2020   RC tear, bicipital pathology, AC arthrosis and impingement-->to have surgery    Objective:  Physical Exam: Vascular: DP/PT pulses 2/4 bilateral. CFT <3 seconds. Normal hair growth on digits. No edema.  Skin. No lacerations or abrasions bilateral feet.  Mild incurvation noted to medial border of left hallux.  No erythema edema or purulence noted.  No tenderness currently to palpation.  Mild hyperkeratosis noted to dorsum of right fifth digit. Musculoskeletal: MMT 5/5 bilateral lower extremities in DF, PF, Inversion and Eversion. Deceased ROM in DF of ankle joint.  Neurological: Sensation intact to light touch.   Assessment:   1. Prediabetes   2. Ingrown left greater toenail   3. Encounter for diabetic foot exam [facility])      Plan:  Patient  was evaluated and treated and all questions answered. -Discussed and educated patient on diabetic foot care, especially with  regards to the vascular, neurological and musculoskeletal systems.  -Stressed the importance of good glycemic control and the detriment of not  controlling glucose levels in relation to the foot. -Discussed supportive shoes at all times and checking feet regularly.  - Discussed ingrown toenails and conservative treatment versus procedural intervention.  Discussed at some point patient may need ingrown nail procedure but at this time can continue with conservative care.  Advised to keep nails longer and trim straight across. -Answered all patient questions -Patient to return  in 1 year for diabetic foot check -Patient advised to call the office if any problems or questions arise in the  meantime.   Asberry Failing, DPM    "

## 2024-10-27 ENCOUNTER — Encounter: Payer: Self-pay | Admitting: Family Medicine

## 2024-10-27 ENCOUNTER — Encounter: Admitting: Family Medicine

## 2024-10-27 NOTE — Progress Notes (Unsigned)
 "     Office Note 10/27/2024  CC: No chief complaint on file.  Patient is a 64 y.o. female who is here for annual health maintenance exam and 39-month follow-up diabetes, hypertension, and hypercholesterolemia. A/P as of last visit: #1 diabetes without complication.  Well-controlled on Ozempic  2 mg weekly. Hemoglobin A1c is 5.6% today. Urine microalbumin/creatinine and serum creatinine today.   #2 essential hypertension, doing well on lisinopril  10 mg a day. Electrolytes and creatinine monitoring today.   3.  Hypercholesterolemia, doing well on a atorvastatin  20 mg daily. Lipid panel and hepatic panel today  INTERIM HX: ***  Past Medical History:  Diagnosis Date   Allergic rhinitis    Anemia    Arthralgia of multiple sites    Gen rheum lab w/u normal/neg 11/2016 by prior PCP   Colon cancer screening    06/2024 cologuard negative   Constipation    DDD (degenerative disc disease), lumbar 11/2020   R glut/hip pain->ortho, MRI->disc causing pinched nerve at L3-4 level on R, signif dz on L at L4-5 and L5-S1--->ESI on R 11/2020 very helpful, +PT.   Diabetes mellitus (HCC) 2022   Prediab 2017 --Old PCP records state only that her Hba1c was 6.1% in 2017: she took metformin  x ? 6-12 mo, rx'd by wt loss MD.  A1c 6.1% Nov 2019. A1c 6.2% 12/2019. A1c 6.8% 12/2020.   Diverticulosis    Fatty liver    Noted on CT done during her hospitalization for pyelo 03/2022   GERD (gastroesophageal reflux disease)    +grade A esophagitis 06/2019 EGD.  Bx neg for eosinophilic esoph. 08/2019 esoph manometry normal, pH probe + abnl reflux not fully suppressed by PPI. PPI BID + H2 blocker as of 08/2019.   History of rectal bleeding    remote past->? rectal ulcer vs perf'd divertic-->need old records for clarif.   History of recurrent UTIs    urol 2023   Hypercholesterolemia    Past hx of statin use, then was able to come off meds when she lost wt   Hypertension    Insomnia    Obesity, Class I, BMI 30-34.9     Palpitations    Pyelonephritis    hosp 03/2022   Right hip pain    MRI showed labral tear, got steroid injection. Also troch bursitis.   Right shoulder pain 05/2020   RC tear, bicipital pathology, AC arthrosis and impingement-->to have surgery    Past Surgical History:  Procedure Laterality Date   54 HOUR PH STUDY N/A 08/05/2019   +GERD. Procedure: 24 HOUR PH STUDY;  Surgeon: Eda Iha, MD;  Location: [facility];  Service: Gastroenterology;  Laterality: N/A;   ABDOMINAL HYSTERECTOMY  2005   for benign dx (No BSO)   BREAST BIOPSY Right 2005   CESAREAN SECTION     x 2   CHOLECYSTECTOMY  2011   COLONOSCOPY  most recent 01/2012   x 2->approx 2011 was done for BRBPR, unclear dx (rectal ulcer? perf'd diverticulum?).  Another about 2015 for abd pain w/u?  No polyps detected on either colonoscopy.  Need old records for clarification (GI MD was Dr. Dennise in [place])--prior pcp records say colonoscopy 01/2012 negative.   ESOPHAGEAL MANOMETRY N/A 08/05/2019   NORMAL Procedure: ESOPHAGEAL MANOMETRY (EM);  Surgeon: Eda Iha, MD;  Location: [facility];  Service: Gastroenterology;  Laterality: N/A;   ESOPHAGOGASTRODUODENOSCOPY  06/2019     06/2019->reflux esophagitis, o/w normal.   ESOPHAGOGASTRODUODENOSCOPY N/A 02/27/2022   Procedure: ESOPHAGOGASTRODUODENOSCOPY (EGD);  Surgeon:  Cirigliano, Vito V, DO;  Location: [facility];  Service: Gastroenterology;  Laterality: N/A;   HIATAL HERNIA REPAIR N/A 02/27/2022   Procedure: LAPAROSCOPIC REPAIR OF HIATAL HERNIA;  Surgeon: Tanda Locus, MD;  Location: [facility];  Service: General;  Laterality: N/A;   ROTATOR CUFF REPAIR Right 06/2021   TONSILLECTOMY AND ADENOIDECTOMY  2009   TRANSORAL INCISIONLESS FUNDOPLICATION N/A 02/27/2022   Procedure: TRANSORAL INCISIONLESS FUNDOPLICATION;  Surgeon: San Sandor GAILS, DO;  Location: [facility];  Service: Gastroenterology;  Laterality: N/A;    Family History  Problem Relation Age of Onset   Diabetes Mother     Alcohol abuse Mother    High blood pressure Mother    Rheumatic fever Father    Heart attack Father    Diabetes Brother    Breast cancer Maternal Aunt    Colon cancer Neg Hx    Esophageal cancer Neg Hx    Rectal cancer Neg Hx    Stomach cancer Neg Hx     Social History   Socioeconomic History   Marital status: Married    Spouse name: Caprice   Number of children: 2   Years of education: Not on file   Highest education level: Bachelor's degree (e.g., BA, AB, BS)  Occupational History   Occupation: Conservation officer, historic buildings, chartered loss adjuster (travel required)  Tobacco Use   Smoking status: Never   Smokeless tobacco: Never  Vaping Use   Vaping status: Never Used  Substance and Sexual Activity   Alcohol use: Not Currently    Comment: wine occasional   Drug use: Never   Sexual activity: Not on file  Other Topics Concern   Not on file  Social History Narrative   Married, 2 daughters.   Moved to [facility] from [facility] [place] 2019 to be closer to daughter and grandchildren.   Educ: College in [place].     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   In [place], she works from home for [employer].   Social Drivers of Health   Tobacco Use: Low Risk (10/26/2024)   Patient History    Smoking Tobacco Use: Never    Smokeless Tobacco Use: Never    Passive Exposure: Not on file  Financial Resource Strain: Low Risk (05/04/2024)   Overall Financial Resource Strain (CARDIA)    Difficulty of Paying Living Expenses: Not hard at all  Food Insecurity: No Food Insecurity (05/04/2024)   Epic    Worried About Radiation Protection Practitioner of Food in the Last Year: Never true    Ran Out of Food in the Last Year: Never true  Transportation Needs: No Transportation Needs (05/04/2024)   Epic    Lack of Transportation (Medical): No    Lack of Transportation (Non-Medical): No  Physical Activity: Insufficiently Active (05/04/2024)   Exercise Vital Sign    Days of Exercise per Week: 2 days    Minutes of Exercise per  Session: 30 min  Stress: Stress Concern Present (05/04/2024)   [employer] of Occupational Health - Occupational Stress Questionnaire    Feeling of Stress: To some extent  Social Connections: Moderately Isolated (05/04/2024)   Social Connection and Isolation Panel    Frequency of Communication with Friends and Family: More than three times a week    Frequency of Social Gatherings with Friends and Family: Patient declined    Attends Religious Services: Patient declined    Database Administrator or Organizations: No    Attends Banker Meetings: Not on file  Marital Status: Married  Catering Manager Violence: Not on file  Depression (PHQ2-9): Low Risk (05/05/2024)   Depression (PHQ2-9)    PHQ-2 Score: 4  Alcohol Screen: Low Risk (05/04/2024)   Alcohol Screen    Last Alcohol Screening Score (AUDIT): 1  Housing: Low Risk (05/04/2024)   Epic    Unable to Pay for Housing in the Last Year: No    Number of Times Moved in the Last Year: 0    Homeless in the Last Year: No  Utilities: Not on file  Health Literacy: Not on file    Outpatient Medications Prior to Visit  Medication Sig Dispense Refill   acetaminophen  (TYLENOL ) 500 MG tablet Take 1,000 mg by mouth every 6 (six) hours as needed for mild pain, fever or headache.     atorvastatin  (LIPITOR) 20 MG tablet Take 1 tablet (20 mg total) by mouth daily. 90 tablet 3   cholecalciferol (VITAMIN D ) 25 MCG (1000 UT) tablet Take 1,000 Units by mouth daily.     gabapentin  (NEURONTIN ) 300 MG capsule Take 300 mg by mouth every other day.     levocetirizine (XYZAL ) 5 MG tablet Take 5 mg by mouth daily.      lisinopril  (ZESTRIL ) 10 MG tablet Take 1 tablet (10 mg total) by mouth daily. 90 tablet 3   meloxicam  (MOBIC ) 15 MG tablet Take 15 mg by mouth in the morning and at bedtime.     methylPREDNISolone  (MEDROL  DOSEPAK) 4 MG TBPK tablet Use as directed. 21 each 1   montelukast  (SINGULAIR ) 10 MG tablet Take 1 tablet (10 mg total) by  mouth at bedtime. 90 tablet 3   nitrofurantoin (MACRODANTIN) 50 MG capsule Take 50 mg by mouth. Post sexual activity to prevent UTI's.     Omega-3 Fatty Acids (OMEGA-3 FISH OIL PO) Take 1 capsule by mouth daily.     OZEMPIC , 2 MG/DOSE, 8 MG/3ML SOPN INJECT SUBCUTANEOUSLY 2 MG EVERY WEEK 9 mL 3   No facility-administered medications prior to visit.    Allergies[1]  Review of Systems *** PE;    05/05/2024    8:29 AM 09/23/2023    8:50 AM 09/16/2023    9:35 AM  Vitals with BMI  Height 4' 11 4' 11 4' 11  Weight 158 lbs 6 oz 158 lbs 156 lbs 13 oz  BMI 31.98 31.89 31.65  Systolic 117 123 883  Diastolic 76 83 74  Pulse 75 73 70     *** Pertinent labs:  Lab Results  Component Value Date   TSH 2.12 09/11/2022   Lab Results  Component Value Date   WBC 7.9 09/16/2023   HGB 13.0 09/16/2023   HCT 41.1 09/16/2023   MCV 87.6 09/16/2023   PLT 299.0 09/16/2023   Lab Results  Component Value Date   CREATININE 1.02 05/05/2024   BUN 10 05/05/2024   NA 141 05/05/2024   K 4.8 05/05/2024   CL 105 05/05/2024   CO2 26 05/05/2024   Lab Results  Component Value Date   ALT 26 05/05/2024   AST 24 05/05/2024   ALKPHOS 98 05/05/2024   BILITOT 0.5 05/05/2024   Lab Results  Component Value Date   CHOL 132 05/05/2024   Lab Results  Component Value Date   HDL 59.30 05/05/2024   Lab Results  Component Value Date   LDLCALC 57 05/05/2024   Lab Results  Component Value Date   TRIG 76.0 05/05/2024   Lab Results  Component Value Date   CHOLHDL  2 05/05/2024   Lab Results  Component Value Date   HGBA1C 5.6 05/05/2024   HGBA1C 5.6 05/05/2024   HGBA1C 5.6 (A) 05/05/2024   HGBA1C 5.6 05/05/2024  Last vitamin D  Lab Results  Component Value Date   VD25OH 33.73 09/16/2023   Lab Results  Component Value Date   VITAMINB12 438 01/04/2020   ASSESSMENT AND PLAN:   No problem-specific Assessment & Plan notes found for this encounter.  1 health maintenance exam: Reviewed  age and gender appropriate health maintenance issues (prudent diet, regular exercise, health risks of tobacco and excessive alcohol, use of seatbelts, fire alarms in home, use of sunscreen).  Also reviewed age and gender appropriate health screening as well as vaccine recommendations. Vaccines: ALL UTD. Labs: CBC, CMET, FLP,  A1c, vitamin D  level (vit D def). Cervical ca screening: n/a--> history of hysterectomy for benign diagnosis. Breast ca screening: Mammogram scheduled for 11/23/2024. Colon ca screening: [place]--> her GI MD here, Dr. San, discussed things with [name] and has recommended her next colonoscopy to be in 2026-->reminder given today.  An After Visit Summary was printed and given to the patient.  FOLLOW UP:  No follow-ups on file.  Signed:  Gerlene Hockey, MD           10/27/2024     [1]  Allergies Allergen Reactions   Codeine Anaphylaxis   "

## 2024-11-19 ENCOUNTER — Encounter: Admitting: Family Medicine

## 2024-11-23 ENCOUNTER — Ambulatory Visit (HOSPITAL_BASED_OUTPATIENT_CLINIC_OR_DEPARTMENT_OTHER)

## 2024-11-25 ENCOUNTER — Encounter: Admitting: Family Medicine
